# Patient Record
Sex: Female | Born: 1970 | Race: Black or African American | Hispanic: No | Smoking: Never smoker
Health system: Southern US, Community
[De-identification: ages and names within clinical notes are randomized; demographics above are authoritative.]

## PROBLEM LIST (undated history)

## (undated) DIAGNOSIS — I10 Essential (primary) hypertension: Secondary | ICD-10-CM

## (undated) DIAGNOSIS — Z8719 Personal history of other diseases of the digestive system: Secondary | ICD-10-CM

## (undated) DIAGNOSIS — R51 Headache: Secondary | ICD-10-CM

## (undated) DIAGNOSIS — G4730 Sleep apnea, unspecified: Secondary | ICD-10-CM

## (undated) DIAGNOSIS — M109 Gout, unspecified: Secondary | ICD-10-CM

## (undated) DIAGNOSIS — E785 Hyperlipidemia, unspecified: Secondary | ICD-10-CM

## (undated) DIAGNOSIS — R519 Headache, unspecified: Secondary | ICD-10-CM

## (undated) HISTORY — PX: ABDOMINAL HYSTERECTOMY: SHX81

## (undated) HISTORY — DX: Sleep apnea, unspecified: G47.30

## (undated) HISTORY — DX: Hyperlipidemia, unspecified: E78.5

## (undated) HISTORY — PX: TUBAL LIGATION: SHX77

## (undated) HISTORY — PX: OTHER SURGICAL HISTORY: SHX169

## (undated) HISTORY — DX: Essential (primary) hypertension: I10

---

## 1998-09-21 ENCOUNTER — Other Ambulatory Visit: Admission: RE | Admit: 1998-09-21 | Discharge: 1998-09-21 | Payer: Self-pay | Admitting: Obstetrics and Gynecology

## 1999-03-16 ENCOUNTER — Ambulatory Visit (HOSPITAL_COMMUNITY): Admission: RE | Admit: 1999-03-16 | Discharge: 1999-03-16 | Payer: Self-pay | Admitting: Family Medicine

## 1999-03-16 ENCOUNTER — Encounter: Payer: Self-pay | Admitting: Family Medicine

## 1999-07-26 ENCOUNTER — Other Ambulatory Visit: Admission: RE | Admit: 1999-07-26 | Discharge: 1999-07-26 | Payer: Self-pay | Admitting: Obstetrics and Gynecology

## 2000-01-06 ENCOUNTER — Emergency Department (HOSPITAL_COMMUNITY): Admission: EM | Admit: 2000-01-06 | Discharge: 2000-01-07 | Payer: Self-pay | Admitting: Emergency Medicine

## 2000-01-07 ENCOUNTER — Encounter: Payer: Self-pay | Admitting: Emergency Medicine

## 2000-08-08 ENCOUNTER — Other Ambulatory Visit: Admission: RE | Admit: 2000-08-08 | Discharge: 2000-08-08 | Payer: Self-pay | Admitting: Obstetrics and Gynecology

## 2001-07-03 ENCOUNTER — Encounter: Payer: Self-pay | Admitting: Emergency Medicine

## 2001-07-03 ENCOUNTER — Emergency Department (HOSPITAL_COMMUNITY): Admission: EM | Admit: 2001-07-03 | Discharge: 2001-07-03 | Payer: Self-pay | Admitting: Emergency Medicine

## 2001-08-12 ENCOUNTER — Other Ambulatory Visit: Admission: RE | Admit: 2001-08-12 | Discharge: 2001-08-12 | Payer: Self-pay | Admitting: Obstetrics and Gynecology

## 2001-08-26 ENCOUNTER — Ambulatory Visit (HOSPITAL_COMMUNITY): Admission: RE | Admit: 2001-08-26 | Discharge: 2001-08-26 | Payer: Self-pay | Admitting: Obstetrics and Gynecology

## 2001-08-26 ENCOUNTER — Encounter: Payer: Self-pay | Admitting: Obstetrics and Gynecology

## 2002-08-15 ENCOUNTER — Other Ambulatory Visit: Admission: RE | Admit: 2002-08-15 | Discharge: 2002-08-15 | Payer: Self-pay | Admitting: Obstetrics and Gynecology

## 2003-02-16 ENCOUNTER — Emergency Department (HOSPITAL_COMMUNITY): Admission: EM | Admit: 2003-02-16 | Discharge: 2003-02-16 | Payer: Self-pay | Admitting: Emergency Medicine

## 2003-02-16 ENCOUNTER — Encounter: Payer: Self-pay | Admitting: Emergency Medicine

## 2003-03-23 ENCOUNTER — Inpatient Hospital Stay (HOSPITAL_COMMUNITY): Admission: AD | Admit: 2003-03-23 | Discharge: 2003-03-23 | Payer: Self-pay | Admitting: Obstetrics and Gynecology

## 2003-04-20 ENCOUNTER — Inpatient Hospital Stay (HOSPITAL_COMMUNITY): Admission: AD | Admit: 2003-04-20 | Discharge: 2003-04-20 | Payer: Self-pay | Admitting: Obstetrics and Gynecology

## 2003-06-01 ENCOUNTER — Ambulatory Visit (HOSPITAL_COMMUNITY): Admission: RE | Admit: 2003-06-01 | Discharge: 2003-06-01 | Payer: Self-pay | Admitting: Obstetrics and Gynecology

## 2003-06-15 ENCOUNTER — Ambulatory Visit (HOSPITAL_COMMUNITY): Admission: RE | Admit: 2003-06-15 | Discharge: 2003-06-15 | Payer: Self-pay | Admitting: Obstetrics and Gynecology

## 2003-07-07 ENCOUNTER — Encounter: Admission: RE | Admit: 2003-07-07 | Discharge: 2003-10-05 | Payer: Self-pay | Admitting: Obstetrics and Gynecology

## 2003-08-09 ENCOUNTER — Inpatient Hospital Stay (HOSPITAL_COMMUNITY): Admission: AD | Admit: 2003-08-09 | Discharge: 2003-08-14 | Payer: Self-pay | Admitting: Obstetrics and Gynecology

## 2003-08-17 ENCOUNTER — Inpatient Hospital Stay (HOSPITAL_COMMUNITY): Admission: AD | Admit: 2003-08-17 | Discharge: 2003-08-17 | Payer: Self-pay | Admitting: Obstetrics and Gynecology

## 2003-08-21 ENCOUNTER — Ambulatory Visit (HOSPITAL_COMMUNITY): Admission: RE | Admit: 2003-08-21 | Discharge: 2003-08-21 | Payer: Self-pay | Admitting: Obstetrics and Gynecology

## 2003-08-21 ENCOUNTER — Encounter: Admission: RE | Admit: 2003-08-21 | Discharge: 2003-08-21 | Payer: Self-pay | Admitting: Obstetrics and Gynecology

## 2003-08-26 ENCOUNTER — Inpatient Hospital Stay (HOSPITAL_COMMUNITY): Admission: AD | Admit: 2003-08-26 | Discharge: 2003-08-26 | Payer: Self-pay | Admitting: Obstetrics and Gynecology

## 2003-08-28 ENCOUNTER — Ambulatory Visit (HOSPITAL_COMMUNITY): Admission: RE | Admit: 2003-08-28 | Discharge: 2003-08-28 | Payer: Self-pay | Admitting: Obstetrics and Gynecology

## 2003-08-28 ENCOUNTER — Encounter: Admission: RE | Admit: 2003-08-28 | Discharge: 2003-08-28 | Payer: Self-pay | Admitting: Obstetrics and Gynecology

## 2003-08-30 ENCOUNTER — Encounter (INDEPENDENT_AMBULATORY_CARE_PROVIDER_SITE_OTHER): Payer: Self-pay | Admitting: Specialist

## 2003-08-30 ENCOUNTER — Inpatient Hospital Stay (HOSPITAL_COMMUNITY): Admission: AD | Admit: 2003-08-30 | Discharge: 2003-09-02 | Payer: Self-pay | Admitting: Obstetrics and Gynecology

## 2003-09-02 ENCOUNTER — Inpatient Hospital Stay (HOSPITAL_COMMUNITY): Admission: AD | Admit: 2003-09-02 | Discharge: 2003-09-03 | Payer: Self-pay | Admitting: Obstetrics and Gynecology

## 2003-09-03 ENCOUNTER — Inpatient Hospital Stay (HOSPITAL_COMMUNITY): Admission: AD | Admit: 2003-09-03 | Discharge: 2003-09-03 | Payer: Self-pay | Admitting: Obstetrics and Gynecology

## 2003-09-29 ENCOUNTER — Inpatient Hospital Stay (HOSPITAL_COMMUNITY): Admission: AD | Admit: 2003-09-29 | Discharge: 2003-09-29 | Payer: Self-pay | Admitting: Obstetrics and Gynecology

## 2003-11-12 ENCOUNTER — Emergency Department (HOSPITAL_COMMUNITY): Admission: EM | Admit: 2003-11-12 | Discharge: 2003-11-12 | Payer: Self-pay | Admitting: Emergency Medicine

## 2003-11-18 ENCOUNTER — Encounter (INDEPENDENT_AMBULATORY_CARE_PROVIDER_SITE_OTHER): Payer: Self-pay | Admitting: Specialist

## 2003-11-18 ENCOUNTER — Ambulatory Visit (HOSPITAL_COMMUNITY): Admission: RE | Admit: 2003-11-18 | Discharge: 2003-11-18 | Payer: Self-pay | Admitting: Obstetrics and Gynecology

## 2003-12-21 ENCOUNTER — Other Ambulatory Visit: Admission: RE | Admit: 2003-12-21 | Discharge: 2003-12-21 | Payer: Self-pay | Admitting: Obstetrics and Gynecology

## 2004-08-22 ENCOUNTER — Emergency Department (HOSPITAL_COMMUNITY): Admission: EM | Admit: 2004-08-22 | Discharge: 2004-08-22 | Payer: Self-pay | Admitting: Family Medicine

## 2004-09-21 IMAGING — CT CT ABDOMEN W/ CM
1 of 2 series · 15 of 32 positions shown, 19 images · IV contrast (omnipaque)
Comparison: none

CLINICAL DATA: Persistent abdominal pain, post-partum. 
 CT OF THE ABDOMEN WITH CONTRAST 
 No prior studies.
TECHNIQUE: Contiguous axial CT images were obtained from the lung bases through the iliac crests following oral contrast and intravenous administration of 150 cc of Omnipaque 300 IV contrast.

[Series 2: — · axial · 0.73mm/px · z∈[-481,-76]mm · 15 of 93 slices shown, 19 images]
[im 4/93  soft-tissue]
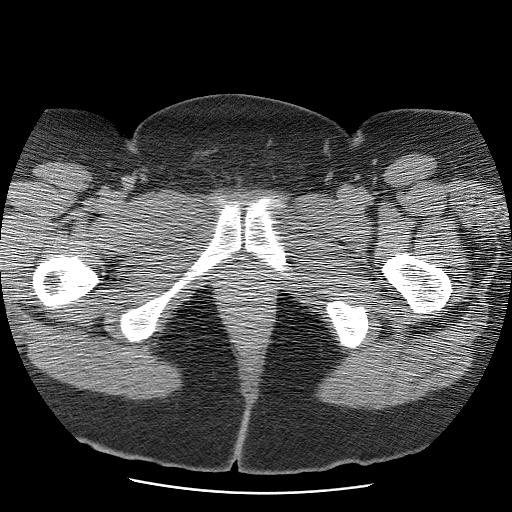
[im 4/93  bone]
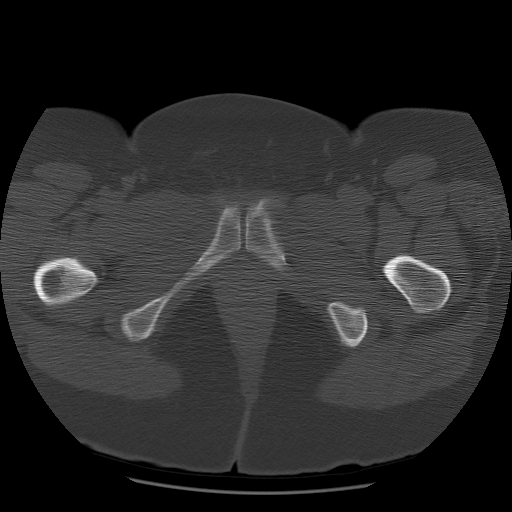
[im 12/93  soft-tissue]
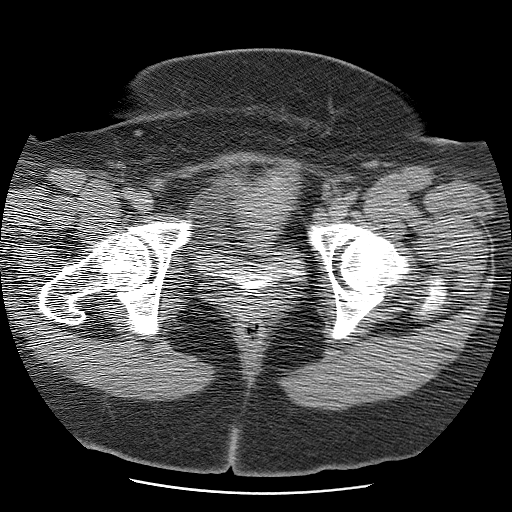
[im 19/93  soft-tissue]
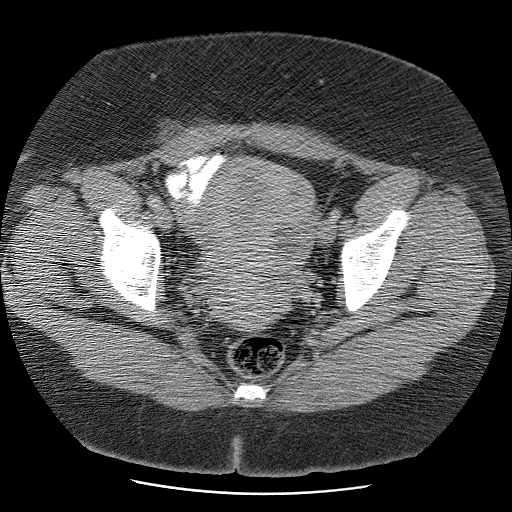
[im 26/93  soft-tissue]
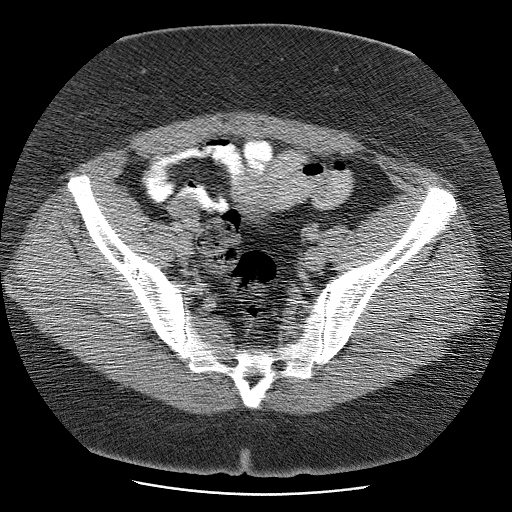
[im 34/93  soft-tissue]
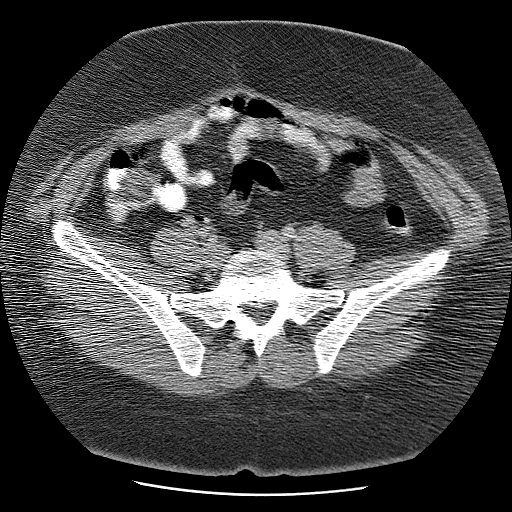
[im 41/93  soft-tissue]
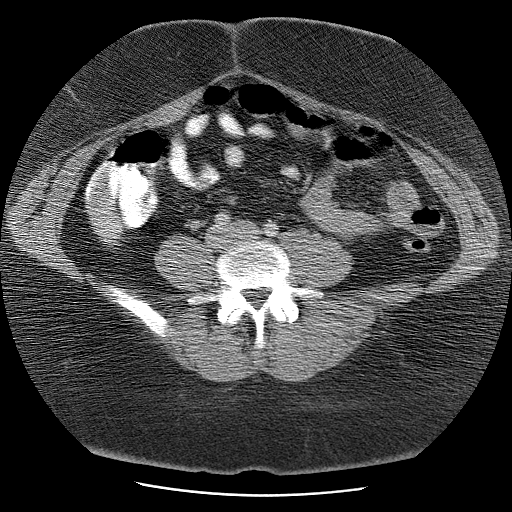
[im 48/93  soft-tissue]
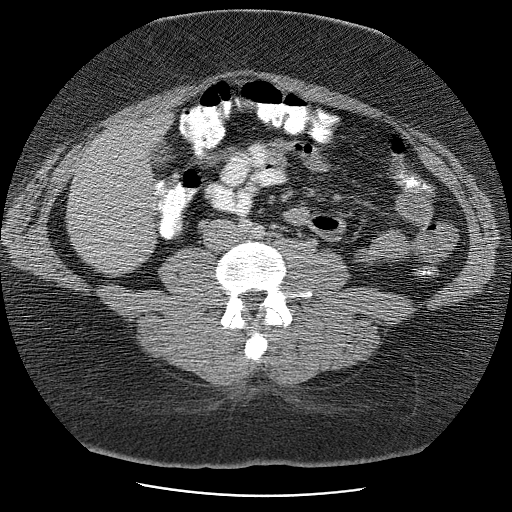
[im 52/93  soft-tissue]
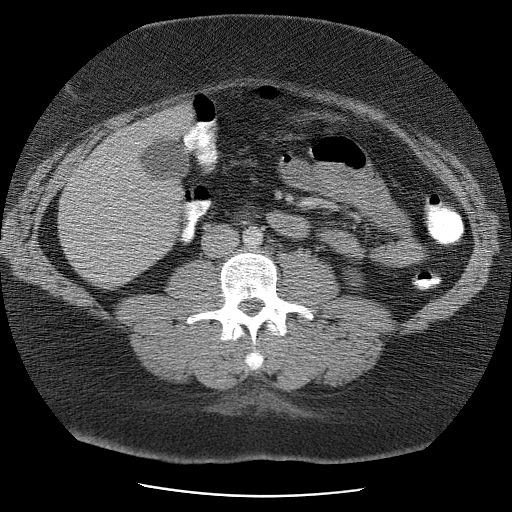
[im 59/93  soft-tissue]
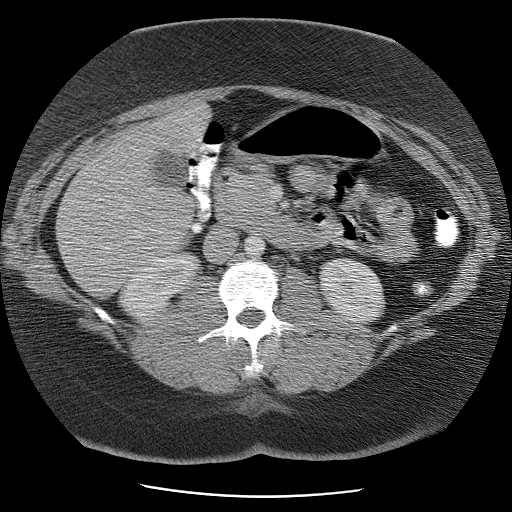
[im 59/93  bone]
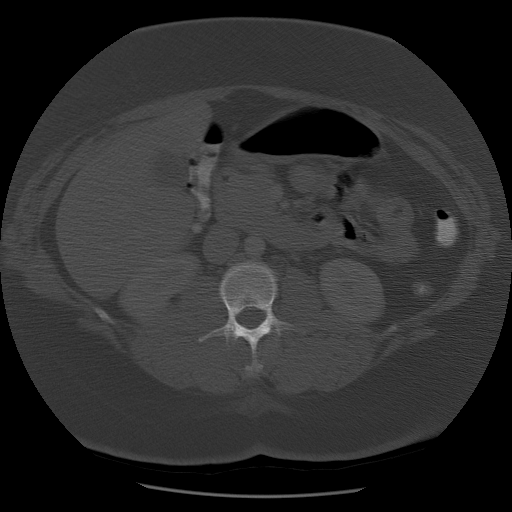
[im 67/93  soft-tissue]
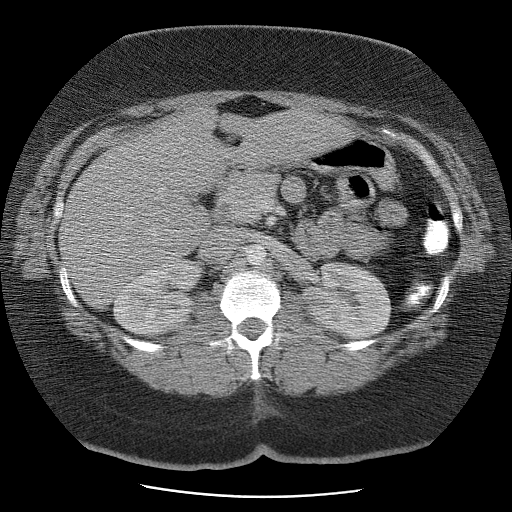
[im 74/93  soft-tissue]
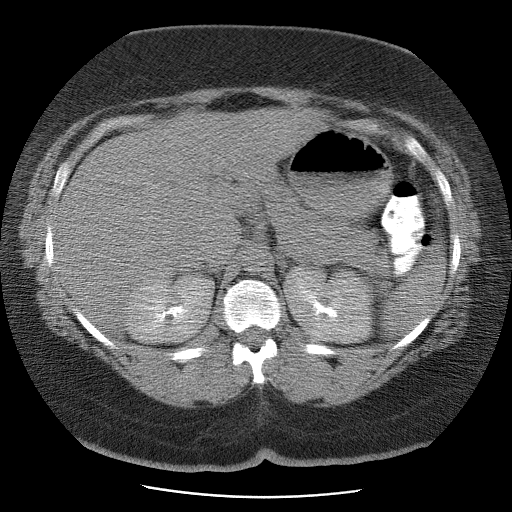
[im 78/93  lung]
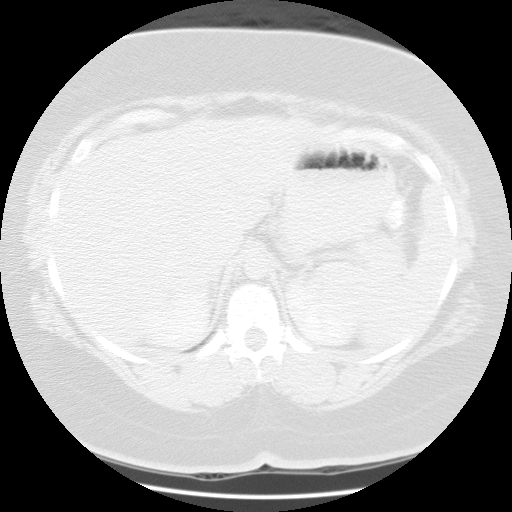
[im 81/93  soft-tissue]
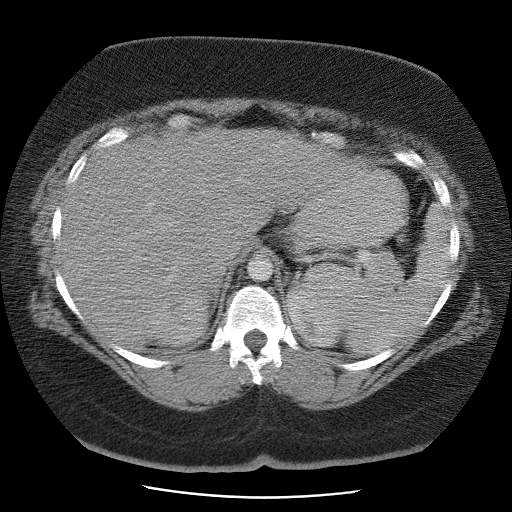
[im 81/93  lung]
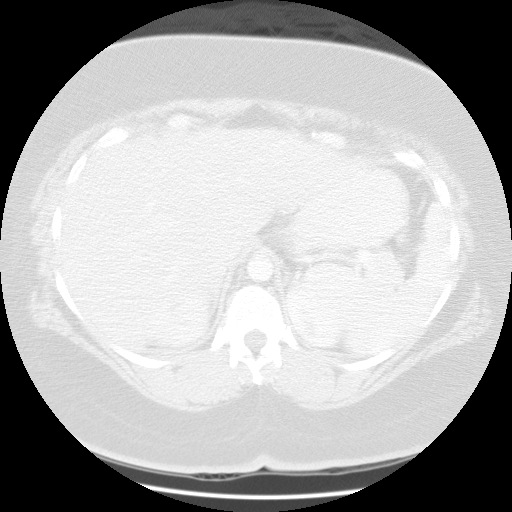
[im 85/93  lung]
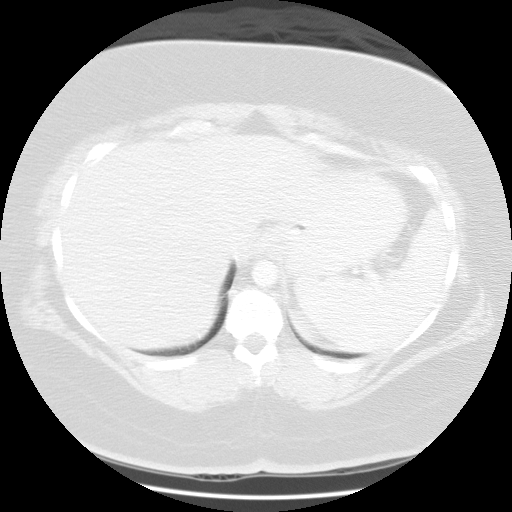
[im 89/93  soft-tissue]
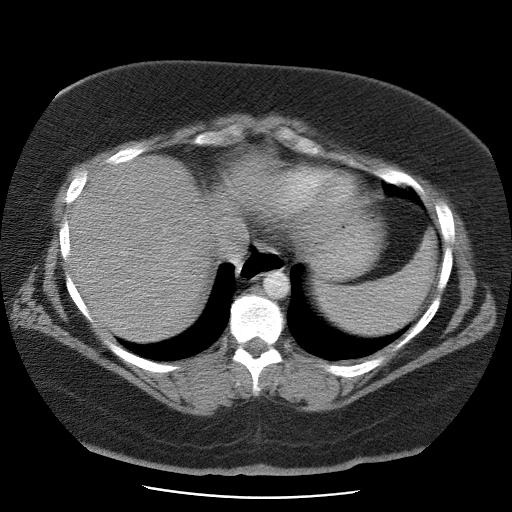
[im 89/93  lung]
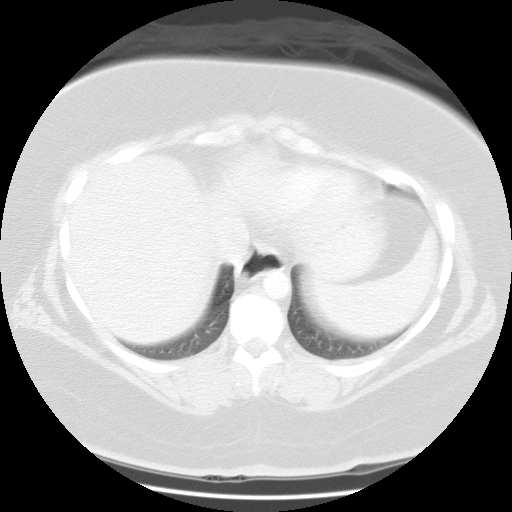

[15 of 32 positions shown; findings below may reference images not displayed]

FINDINGS: The patient?s body habitus reduces diagnostic sensitivity and specificity.  No discrete abnormality of the liver, gallbladder, spleen, pancreas, kidneys or adrenal glands is evident.  The abdominal aorta appears unremarkable.  Ascending colon appears normal.  Proximal jejunal loops do not have oral contrast within them and thus are relatively nonspecific.  There is contrast in the colon and distal small bowel which appears normal.  No dilated bowel to suggest obstruction.  No adenopathy.  
 IMPRESSION
 1.  No cause for the patient?s right-sided abdominal pain is apparent on today?s CT scan of the abdomen. 
 CT OF THE PELVIS WITH CONTRAST 
 Contiguous axial CT images were obtained from the iliac crests to the proximal femora following oral and IV contrast.
FINDINGS: There are multiple uterine fibroids.  The largest of these is in the fundus and measures approximately 4 cm in diameter.  The ovaries appear unremarkable.  The appendix likewise appears unremarkable.  No significant free pelvic fluid. 
 No pelvic abscess is identified. 
 IMPRESSION
 1.  Uterine fibroids. 
 2.  No evidence of pelvic abscess or cause for the patient?s right-sided abdominal pain.

## 2005-03-20 ENCOUNTER — Other Ambulatory Visit: Admission: RE | Admit: 2005-03-20 | Discharge: 2005-03-20 | Payer: Self-pay | Admitting: Obstetrics and Gynecology

## 2005-07-28 ENCOUNTER — Emergency Department (HOSPITAL_COMMUNITY): Admission: EM | Admit: 2005-07-28 | Discharge: 2005-07-28 | Payer: Self-pay | Admitting: Family Medicine

## 2006-03-16 ENCOUNTER — Emergency Department (HOSPITAL_COMMUNITY): Admission: EM | Admit: 2006-03-16 | Discharge: 2006-03-16 | Payer: Self-pay | Admitting: Family Medicine

## 2006-08-09 ENCOUNTER — Other Ambulatory Visit: Admission: RE | Admit: 2006-08-09 | Discharge: 2006-08-09 | Payer: Self-pay | Admitting: Family Medicine

## 2007-04-26 ENCOUNTER — Emergency Department (HOSPITAL_COMMUNITY): Admission: EM | Admit: 2007-04-26 | Discharge: 2007-04-26 | Payer: Self-pay | Admitting: Emergency Medicine

## 2007-07-29 ENCOUNTER — Emergency Department (HOSPITAL_COMMUNITY): Admission: EM | Admit: 2007-07-29 | Discharge: 2007-07-29 | Payer: Self-pay | Admitting: Emergency Medicine

## 2007-08-19 ENCOUNTER — Other Ambulatory Visit: Admission: RE | Admit: 2007-08-19 | Discharge: 2007-08-19 | Payer: Self-pay | Admitting: Family Medicine

## 2008-08-13 ENCOUNTER — Encounter (INDEPENDENT_AMBULATORY_CARE_PROVIDER_SITE_OTHER): Payer: Self-pay | Admitting: Obstetrics and Gynecology

## 2008-08-13 ENCOUNTER — Inpatient Hospital Stay (HOSPITAL_COMMUNITY): Admission: RE | Admit: 2008-08-13 | Discharge: 2008-08-15 | Payer: Self-pay | Admitting: Obstetrics and Gynecology

## 2008-12-01 ENCOUNTER — Emergency Department (HOSPITAL_COMMUNITY): Admission: EM | Admit: 2008-12-01 | Discharge: 2008-12-01 | Payer: Self-pay | Admitting: Emergency Medicine

## 2009-05-19 ENCOUNTER — Emergency Department (HOSPITAL_COMMUNITY): Admission: EM | Admit: 2009-05-19 | Discharge: 2009-05-19 | Payer: Self-pay | Admitting: Family Medicine

## 2009-08-24 ENCOUNTER — Emergency Department (HOSPITAL_COMMUNITY): Admission: EM | Admit: 2009-08-24 | Discharge: 2009-08-24 | Payer: Self-pay | Admitting: Family Medicine

## 2009-11-16 ENCOUNTER — Emergency Department (HOSPITAL_COMMUNITY): Admission: EM | Admit: 2009-11-16 | Discharge: 2009-11-16 | Payer: Self-pay | Admitting: Family Medicine

## 2009-12-16 ENCOUNTER — Emergency Department (HOSPITAL_COMMUNITY): Admission: EM | Admit: 2009-12-16 | Discharge: 2009-12-16 | Payer: Self-pay | Admitting: Family Medicine

## 2010-04-05 LAB — HM PAP SMEAR: HM Pap smear: NORMAL

## 2010-04-16 ENCOUNTER — Ambulatory Visit: Payer: Self-pay | Admitting: Family Medicine

## 2010-04-16 ENCOUNTER — Other Ambulatory Visit
Admission: RE | Admit: 2010-04-16 | Discharge: 2010-04-16 | Payer: Self-pay | Source: Home / Self Care | Admitting: Family Medicine

## 2010-04-16 DIAGNOSIS — J309 Allergic rhinitis, unspecified: Secondary | ICD-10-CM

## 2010-04-16 DIAGNOSIS — E782 Mixed hyperlipidemia: Secondary | ICD-10-CM

## 2010-04-16 DIAGNOSIS — I10 Essential (primary) hypertension: Secondary | ICD-10-CM | POA: Insufficient documentation

## 2010-04-17 ENCOUNTER — Encounter (INDEPENDENT_AMBULATORY_CARE_PROVIDER_SITE_OTHER): Payer: Self-pay | Admitting: *Deleted

## 2010-04-28 ENCOUNTER — Encounter: Payer: Self-pay | Admitting: Family Medicine

## 2010-06-11 ENCOUNTER — Encounter: Payer: Self-pay | Admitting: Obstetrics and Gynecology

## 2010-06-19 LAB — CONVERTED CEMR LAB
ALT: 15 units/L (ref 0–35)
AST: 19 units/L (ref 0–37)
Albumin: 4 g/dL (ref 3.5–5.2)
Alkaline Phosphatase: 54 units/L (ref 39–117)
BUN: 11 mg/dL (ref 6–23)
Basophils Absolute: 0 10*3/uL (ref 0.0–0.1)
Basophils Relative: 0 % (ref 0–1)
Bilirubin Urine: NEGATIVE
CO2: 26 meq/L (ref 19–32)
Calcium: 9.1 mg/dL (ref 8.4–10.5)
Chloride: 100 meq/L (ref 96–112)
Creatinine, Ser: 0.72 mg/dL (ref 0.40–1.20)
Eosinophils Absolute: 0.2 10*3/uL (ref 0.0–0.7)
Eosinophils Relative: 3 % (ref 0–5)
Glucose, Bld: 86 mg/dL (ref 70–99)
Glucose, Urine, Semiquant: NEGATIVE
HCT: 37.9 % (ref 36.0–46.0)
Hemoglobin: 12.5 g/dL (ref 12.0–15.0)
Ketones, urine, test strip: NEGATIVE
Lymphocytes Relative: 38 % (ref 12–46)
Lymphs Abs: 2.5 10*3/uL (ref 0.7–4.0)
MCHC: 33 g/dL (ref 30.0–36.0)
MCV: 83.7 fL (ref 78.0–100.0)
Monocytes Absolute: 0.6 10*3/uL (ref 0.1–1.0)
Monocytes Relative: 9 % (ref 3–12)
Neutro Abs: 3.3 10*3/uL (ref 1.7–7.7)
Neutrophils Relative %: 50 % (ref 43–77)
Nitrite: NEGATIVE
Platelets: 275 10*3/uL (ref 150–400)
Potassium: 4 meq/L (ref 3.5–5.3)
Protein, U semiquant: NEGATIVE
RBC: 4.53 M/uL (ref 3.87–5.11)
RDW: 13.5 % (ref 11.5–15.5)
Sodium: 137 meq/L (ref 135–145)
Specific Gravity, Urine: 1.02
Total Bilirubin: 0.5 mg/dL (ref 0.3–1.2)
Total Protein: 7.3 g/dL (ref 6.0–8.3)
Urobilinogen, UA: 0.2
WBC Urine, dipstick: NEGATIVE
WBC: 6.6 10*3/uL (ref 4.0–10.5)
pH: 6.5

## 2010-06-21 NOTE — Letter (Signed)
Summary: Gynecologic Cytology report  Gynecologic Cytology report   Imported By: Dorna Leitz 04/28/2010 14:23:28  _____________________________________________________________________  External Attachment:    Type:   Image     Comment:   External Document

## 2010-06-21 NOTE — Assessment & Plan Note (Signed)
Summary: PHYSICAL CHECK/EVMPHYSICAL CHECK-UP/EVM   Vital Signs:  Patient Profile:   40 Years Old Female CC:      cpx with pap// also complains of cough dry x 2 weeks Height:     68 inches Weight:      332 pounds BMI:     50.66 O2 Sat:      98 % O2 treatment:    Room Air Temp:     99.1 degrees F oral Pulse rate:   80 / minute Pulse rhythm:   regular Resp:     20 per minute BP sitting:   140 / 80  (left arm) Cuff size:   large  Pt. in pain?   no  Vitals Entered By: Providence Crosby LPN (April 16, 2010 11:41 AM)                   Current Allergies: ! * CODEINEHistory of Present Illness History from: patient Chief Complaint: cpx with pap// also complains of cough dry x 2 weeks History of Present Illness: cpx only complains of dry hacking cough x 2 weeks. Had partial hysterectomy March of 2010. Pt presenting today for a CPE.  Pt reports that she had a CPE 1 year ago with EKG in 07/2008 that was WNL.  Pt reports a persistent cough for 2 weeks with small production of mucus, some fever, no chills, no SOB, no wheezing but worsening nighttime cough reported, some sneezing and clear runny nose, taking Zyrtec D and Mucinex D with no improvement.  Pt would like to have flu vaccine this  year and usually gets it every year.  Pt reports no history of tobacco use.  Pt is taking lisinopril but has been taking it for the last 7 years with no problems.  Pt reports that she did not take it today.  BP is mildly elevated.  No CP.  No diarrhea, nausea or vomiting.  Pt reports that she is allergic to Codeine.  Pt reports that she is wanting to have a Pap done today.  Pt says Pap last years was WNL. No h/o abnormal PAP tests reported by pt.  Also, pt says she has had a hysterectomy and 1 ovary was removed. (Pt not sure if left or right ovary was removed).   REVIEW OF SYSTEMS Constitutional Symptoms      Denies fever, chills, night sweats, weight loss, weight gain, and fatigue.  Eyes       Complains of  glasses.      Denies change in vision, eye pain, eye discharge, contact lenses, and eye surgery.      Comments: FOR READING Ear/Nose/Throat/Mouth       Denies hearing loss/aids, change in hearing, ear pain, ear discharge, dizziness, frequent runny nose, frequent nose bleeds, sinus problems, sore throat, hoarseness, and tooth pain or bleeding.  Respiratory       Complains of dry cough.      Denies productive cough, wheezing, shortness of breath, asthma, bronchitis, and emphysema/COPD.  Cardiovascular       Denies murmurs, chest pain, and tires easily with exhertion.    Gastrointestinal       Denies stomach pain, nausea/vomiting, diarrhea, constipation, blood in bowel movements, and indigestion. Genitourniary       Denies painful urination, kidney stones, and loss of urinary control. Neurological       Denies paralysis, seizures, and fainting/blackouts. Musculoskeletal       Denies muscle pain, joint pain, joint stiffness, decreased range of motion,  redness, swelling, muscle weakness, and gout.  Skin       Denies bruising, unusual mles/lumps or sores, and hair/skin or nail changes.  Psych       Denies mood changes, temper/anger issues, anxiety/stress, speech problems, depression, and sleep problems.  Past History:  Past Medical History: Hypertension  Past Surgical History: Hysterectomy 1 ovary was removed   Family History: Father: ALIVE AND WELL= 62YOA Mother: ALIVE AND WELL =59YOA Siblings: 1 BROTHER MURDERED AT AGE 20 Hypertension and Diabetes Mellitus (type 2)  Social History: Marital Status:SEPERATED CHILDREN : 3 2 BOYS AND A GIRL Occupation: LABWORK Never Smoked Alcohol use-no Drug use-no Regular exercise-no Smoking Status:  never Drug Use:  no Does Patient Exercise:  no Physical Exam General appearance: well developed, well nourished, no acute distress Head: normocephalic, atraumatic Eyes: conjunctivae and lids normal Pupils: equal, round, reactive to  light Ears: normal, no lesions or deformities Nasal: marked sinus and nasal congestion Oral/Pharynx: tongue normal, posterior pharynx without erythema or exudate Neck: neck supple,  trachea midline, no masses Chest/Lungs: no rales, wheezes, or rhonchi bilateral, breath sounds equal without effort, rare post crackles at bilat bases Heart: regular rate and  rhythm, no murmur Abdomen: soft, non-tender without obvious organomegaly GU: normal external genitalia, no lesions seen, normal vaginal mucosa, no cervix seen Extremities: normal extremities Neurological: grossly intact and non-focal Skin: no obvious rashes or lesions MSE: oriented to time, place, and person Assessment Problems:   WALKING PNEUMONIA (ICD-483.0) COUGH (ICD-786.2) MIXED HYPERLIPIDEMIA (ICD-272.2) UNSPECIFIED ESSENTIAL HYPERTENSION (ICD-401.9) ROUTINE GENERAL MEDICAL EXAM@HEALTH  CARE FACL (ICD-V70.0) HEMATURIA UNSPECIFIED (ICD-599.70) FOLLOWING SURGERY FOLLOW-UP VAGINAL PAP SMEAR (ICD-V67.01) ALLERGIC RHINITIS CAUSE UNSPECIFIED (ICD-477.9) New Problems: WALKING PNEUMONIA (ICD-483.0) COUGH (ICD-786.2) MIXED HYPERLIPIDEMIA (ICD-272.2) UNSPECIFIED ESSENTIAL HYPERTENSION (ICD-401.9) ROUTINE GENERAL MEDICAL EXAM@HEALTH  CARE FACL (ICD-V70.0) HEMATURIA UNSPECIFIED (ICD-599.70) FOLLOWING SURGERY FOLLOW-UP VAGINAL PAP SMEAR (ICD-V67.01) ALLERGIC RHINITIS CAUSE UNSPECIFIED (ICD-477.9)   Patient Education: Patient and/or caregiver instructed in the following: rest, fluids, exercise, weight loss. The risks, benefits and possible side effects were clearly explained and discussed with the patient.  The patient verbalized clear understanding.  The patient was given instructions to return if symptoms don't improve, worsen or new changes develop.  If it is not during clinic hours and the patient cannot get back to this clinic then the patient was told to seek medical care at an available urgent care or emergency department.  The  patient verbalized understanding.   Demonstrates willingness to comply.  Plan New Medications/Changes: LISINOPRIL-HYDROCHLOROTHIAZIDE 20-25 MG TABS (LISINOPRIL-HYDROCHLOROTHIAZIDE) take 1 by mouth daily for blood pressure  #90 x 1, 04/16/2010, Clanford Johnson MD ALLEGRA ALLERGY 180 MG TABS (FEXOFENADINE HCL) take 1 tab by mouth daily as needed for allergies  #15 x 1, 04/16/2010, Clanford Johnson MD FLUTICASONE PROPIONATE 50 MCG/ACT SUSP (FLUTICASONE PROPIONATE) 2 sprays per nostril once daily  #1 x 0, 04/16/2010, Clanford Johnson MD DELSYM 30 MG/5ML LQCR (DEXTROMETHORPHAN POLISTIREX) take 1 teaspoon by mouth every 12 hours as needed for coughing  #100 mL x 0, 04/16/2010, Clanford Johnson MD DOXYCYCLINE HYCLATE 100 MG TABS (DOXYCYCLINE HYCLATE) take 1 by mouth two times a day with food until completed  #14 x 0, 04/16/2010, Clanford Johnson MD  New Orders: Nebulizer Tx [phone] Flu Vaccine & Administration [phone] Obtaining Screening PAP Smear [Q0091] UA Dipstick w/o Micro (manual) [81002] Influenza Vaccine NON MCR [00028] Planning Comments:   I also explained the possibility that the patient could have cough related to the lisinopril medication that she is taking and that if her  cough didn't improve her medication may need to be changed. The patient verbalized clear understanding.    Follow Up: Follow up in 2-3 days if no improvement, Follow up on an as needed basis, Follow up with Primary Physician  The patient and/or caregiver has been counseled thoroughly with regard to medications prescribed including dosage, schedule, interactions, rationale for use, and possible side effects and they verbalize understanding.  Diagnoses and expected course of recovery discussed and will return if not improved as expected or if the condition worsens. Patient and/or caregiver verbalized understanding.  Prescriptions: LISINOPRIL-HYDROCHLOROTHIAZIDE 20-25 MG TABS (LISINOPRIL-HYDROCHLOROTHIAZIDE) take 1 by  mouth daily for blood pressure  #90 x 1   Entered and Authorized by:   Standley Dakins MD   Signed by:   Standley Dakins MD on 04/16/2010   Method used:   Electronically to        [facility] [address] (retail)       [address], [address]       [place], [place]  [address]       Ph: [phone]       Fax: [phone]   RxID:   [id number] [facility] ALLERGY 180 MG TABS (FEXOFENADINE HCL) take 1 tab by mouth daily as needed for allergies  #15 x 1   Entered and Authorized by:   Standley Dakins MD   Signed by:   Standley Dakins MD on 04/16/2010   Method used:   Electronically to        [facility] [address] (retail)       [address], [address]       [place], [place]  [address]       Ph: [phone]       Fax: [phone]   RxID:   [id number] FLUTICASONE PROPIONATE 50 MCG/ACT SUSP (FLUTICASONE PROPIONATE) 2 sprays per nostril once daily  #1 x 0   Entered and Authorized by:   Standley Dakins MD   Signed by:   Standley Dakins MD on 04/16/2010   Method used:   Electronically to        [facility] [address] (retail)       [address], [address]       [place], [place]  [address]       Ph: [phone]       Fax: [phone]   RxID:   [id number] DELSYM 30 MG/5ML LQCR (DEXTROMETHORPHAN POLISTIREX) take 1 teaspoon by mouth every 12 hours as needed for coughing  #100 mL x 0   Entered and Authorized by:   Standley Dakins MD   Signed by:   Standley Dakins MD on 04/16/2010   Method used:   Electronically to        [facility] [address] (retail)       [address], [address]       [place], [place]  [address]       Ph: [phone]       Fax: [phone]   RxID:   [id number] DOXYCYCLINE HYCLATE 100 MG TABS (DOXYCYCLINE HYCLATE) take 1 by mouth two times a day with food until completed  #14 x 0   Entered and Authorized by:   Standley Dakins MD   Signed by:   Standley Dakins MD  on 04/16/2010   Method used:   Electronically to        [facility] [address] (retail)       [address], [address]       [place], [place]  [address]       Ph: [phone]       Fax: [phone]   RxID:   [id number]   Patient Instructions: 1)  It is important that you exercise regularly at least 20 minutes 5 times a week. If you develop chest pain, have severe difficulty breathing, or feel very tired , stop exercising immediately and seek medical attention. 2)  You need to lose weight. Consider a lower calorie diet and regular exercise.  3)  Check your Blood Pressure regularly. If it is above 140/90 you should make an appointment. 4)  Oral Rehydration Solution: drink 1/2 ounce every 15 minutes. If tolerated afert 1 hour, drink 1 ounce every 15 minutes. As you can tolerate, keep adding 1/2 ounce every 15 minutes, up to a total of 2-4 ounces. Contact the office if unable to tolerate oral solution, if you keep vomiting, or you continue to have signs of dehydration. 5)  Take your antibiotic as prescribed until ALL of it is gone, but stop if you develop a rash or swelling and contact our office as soon as possible. 6)  Acute sinusitis symptoms for less than 10 days are not helped by antibiotics.Use warm moist compresses, and over the counter decongestants ( only as directed). Call if no improvement in 5-7 days, sooner if increasing pain, fever, or new symptoms. 7)  Acute bronchitis symptoms for less than 10 days are not helped by antibiotics. take over the counter cough medications. call if no improvment in  5-7 days, sooner if increasing cough, fever, or new symptoms( shortness of breath, chest pain). 8)  The patient was informed that there is no on-call provider or services available at this clinic during off-hours (when the clinic is closed).  If the patient developed a problem or concern that required immediate  attention, the patient was advised to go the the nearest available urgent care or emergency department for medical care.  The patient verbalized understanding.     Orders Added: 1)  Nebulizer Tx [94640] 2)  Flu Vaccine & Administration [phone] 3)  Obtaining Screening PAP Smear [Q0091] 4)  UA Dipstick w/o Micro (manual) [81002] 5)  Influenza Vaccine NON MCR [00028]   Immunizations Administered:  Influenza Vaccine:    Vaccine Type: Fluvax Non-MCR    Site: left deltoid    Mfr: GlaxoSmithKline    Dose: 0.5 ml    Route: IM    Given by: Providence Crosby LPN    Exp. Date: 10/2010    Lot #: [id number]    VIS given: 12/14/09 version given April 16, 2010.   Immunizations Administered:  Influenza Vaccine:    Vaccine Type: Fluvax Non-MCR    Site: left deltoid    Mfr: GlaxoSmithKline    Dose: 0.5 ml    Route: IM    Given by: Providence Crosby LPN    Exp. Date: 10/2010    Lot #: [id number]    VIS given: 12/14/09 version given April 16, 2010.  Flu Vaccine Consent Questions:    Do you have a history of severe allergic reactions to this vaccine? no    Any prior history of allergic reactions to egg and/or gelatin? no    Do  you have a sensitivity to the preservative Thimersol? no    Do you have a past history of Guillan-Barre Syndrome? no    Do you currently have an acute febrile illness? no    Have you ever had a severe reaction to latex? no    Vaccine information given and explained to patient? yes    Are you currently pregnant? no   Flu Vaccine Consent Questions    Do you have a history of severe allergic reactions to this vaccine? no    Any prior history of allergic reactions to egg and/or gelatin? no    Do you have a sensitivity to the preservative Thimersol? no    Do you have a past history of Guillan-Barre Syndrome? no    Do you currently have an acute febrile illness? no    Have you ever had a severe reaction to latex? no    Vaccine information given and explained to patient?  yes    Are you currently pregnant? no    Laboratory Results   Urine Tests  Date/Time Recieved: April 16, 2010 1:28 PM  Date/Time Reported: April 16, 2010 1:28 PM   Routine Urinalysis   Color: yellow Appearance: Clear Glucose: negative   (Normal Range: Negative) Bilirubin: negative   (Normal Range: Negative) Ketone: negative   (Normal Range: Negative) Spec. Gravity: 1.020   (Normal Range: 1.003-1.035) Blood: trace-intact   (Normal Range: Negative) pH: 6.5   (Normal Range: 5.0-8.0) Protein: negative   (Normal Range: Negative) Urobilinogen: 0.2   (Normal Range: 0-1) Nitrite: negative   (Normal Range: Negative) Leukocyte Esterace: negative   (Normal Range: Negative)    Comments: urine for culture sent as ordered

## 2010-06-21 NOTE — Letter (Signed)
Summary: Results Follow-up Letter  [facility]  [address]   [place], [place] [address]   Phone: [phone]  Fax: [phone]    04/17/2010  [address] [place], [place]  [address]  Dear Ms. Bleau,   The following are the results of your recent test(s):  Test     Result     Pap Smear    Normal_______  Not Normal_____       Comments: _________________________________________________________ Cholesterol LDL(Bad cholesterol):          Your goal is less than:         HDL (Good cholesterol):        Your goal is more than: _________________________________________________________ Other Tests:Enclosed is a copy of your lab work and instructions  about low cholesterol diet. Please remember to get your cholesterol checked again in 3 months. Try to exercise 30 minutes a day for 5 day a week as instructed per Dr. Laural Benes.   _________________________________________________________  Please call for an appointment Or _________________________________________________________ _________________________________________________________ _________________________________________________________  Sincerely,  Providence Crosby LPN [facility]

## 2010-06-21 NOTE — Miscellaneous (Signed)
Summary: Lab Results  Clinical Lists Changes  Pt. was notified by message system of current Cytology results. Pt ws advised that if she had any questions or concerns to cantact our office. / rwt

## 2010-08-07 LAB — CBC
HCT: 38.8 % (ref 36.0–46.0)
Hemoglobin: 13 g/dL (ref 12.0–15.0)
MCH: 29.3 pg (ref 26.0–34.0)
MCHC: 33.5 g/dL (ref 30.0–36.0)
MCV: 87.3 fL (ref 78.0–100.0)
Platelets: 228 10*3/uL (ref 150–400)
RBC: 4.45 MIL/uL (ref 3.87–5.11)
RDW: 14.5 % (ref 11.5–15.5)
WBC: 8.6 10*3/uL (ref 4.0–10.5)

## 2010-08-07 LAB — POCT I-STAT, CHEM 8
BUN: 11 mg/dL (ref 6–23)
Calcium, Ion: 1.1 mmol/L — ABNORMAL LOW (ref 1.12–1.32)
Chloride: 101 mEq/L (ref 96–112)
Creatinine, Ser: 0.9 mg/dL (ref 0.4–1.2)
Glucose, Bld: 93 mg/dL (ref 70–99)
HCT: 41 % (ref 36.0–46.0)
Hemoglobin: 13.9 g/dL (ref 12.0–15.0)
Potassium: 3.4 mEq/L — ABNORMAL LOW (ref 3.5–5.1)
Sodium: 138 mEq/L (ref 135–145)
TCO2: 28 mmol/L (ref 0–100)

## 2010-08-07 LAB — DIFFERENTIAL
Basophils Absolute: 0 10*3/uL (ref 0.0–0.1)
Basophils Relative: 0 % (ref 0–1)
Eosinophils Absolute: 0.1 10*3/uL (ref 0.0–0.7)
Eosinophils Relative: 2 % (ref 0–5)
Lymphocytes Relative: 43 % (ref 12–46)
Lymphs Abs: 3.7 10*3/uL (ref 0.7–4.0)
Monocytes Absolute: 0.7 10*3/uL (ref 0.1–1.0)
Monocytes Relative: 8 % (ref 3–12)
Neutro Abs: 4.1 10*3/uL (ref 1.7–7.7)
Neutrophils Relative %: 48 % (ref 43–77)

## 2010-08-07 LAB — URIC ACID: Uric Acid, Serum: 9.3 mg/dL — ABNORMAL HIGH (ref 2.4–7.0)

## 2010-08-22 LAB — GC/CHLAMYDIA PROBE AMP, GENITAL
Chlamydia, DNA Probe: NEGATIVE
GC Probe Amp, Genital: NEGATIVE

## 2010-08-22 LAB — POCT URINALYSIS DIP (DEVICE)
Glucose, UA: NEGATIVE mg/dL
Hgb urine dipstick: NEGATIVE
Ketones, ur: 15 mg/dL — AB
Nitrite: NEGATIVE
Protein, ur: NEGATIVE mg/dL
Specific Gravity, Urine: 1.025 (ref 1.005–1.030)
Urobilinogen, UA: 1 mg/dL (ref 0.0–1.0)
pH: 5.5 (ref 5.0–8.0)

## 2010-08-22 LAB — WET PREP, GENITAL
Trich, Wet Prep: NONE SEEN
WBC, Wet Prep HPF POC: NONE SEEN
Yeast Wet Prep HPF POC: NONE SEEN

## 2010-09-01 LAB — CBC
HCT: 30.2 % — ABNORMAL LOW (ref 36.0–46.0)
HCT: 37.8 % (ref 36.0–46.0)
Hemoglobin: 12.6 g/dL (ref 12.0–15.0)
MCHC: 33.5 g/dL (ref 30.0–36.0)
MCHC: 34.2 g/dL (ref 30.0–36.0)
MCV: 86.4 fL (ref 78.0–100.0)
MCV: 86.8 fL (ref 78.0–100.0)
Platelets: 233 10*3/uL (ref 150–400)
Platelets: 285 10*3/uL (ref 150–400)
RBC: 4.37 MIL/uL (ref 3.87–5.11)
RDW: 13.7 % (ref 11.5–15.5)
RDW: 13.8 % (ref 11.5–15.5)
WBC: 8.4 10*3/uL (ref 4.0–10.5)

## 2010-09-01 LAB — BASIC METABOLIC PANEL
BUN: 10 mg/dL (ref 6–23)
CO2: 24 mEq/L (ref 19–32)
Calcium: 9 mg/dL (ref 8.4–10.5)
Chloride: 97 mEq/L (ref 96–112)
Creatinine, Ser: 0.74 mg/dL (ref 0.4–1.2)
GFR calc Af Amer: >60 mL/min (ref >60)
GFR calc non Af Amer: >60 mL/min (ref >60)
Glucose, Bld: 115 mg/dL — ABNORMAL HIGH (ref 70–99)
Potassium: 3.6 mEq/L (ref 3.5–5.1)
Sodium: 132 mEq/L — ABNORMAL LOW (ref 135–145)

## 2010-09-01 LAB — GLUCOSE, CAPILLARY: Glucose-Capillary: 108 mg/dL — ABNORMAL HIGH (ref 70–99)

## 2010-09-01 LAB — PREGNANCY, URINE: Preg Test, Ur: NEGATIVE

## 2010-10-04 NOTE — Discharge Summary (Signed)
Janice Huang, Janice Huang          ACCOUNT NO.:  [id number]   MEDICAL RECORD NO.:  [id number]          PATIENT TYPE:  INP   LOCATION:  9320                          FACILITY:  [facility]   PHYSICIAN:  Charles A. Delcambre, MDDATE OF BIRTH:  02-04-1971   DATE OF ADMISSION:  08/13/2008  DATE OF DISCHARGE:  08/15/2008                               DISCHARGE SUMMARY   PRIMARY DISCHARGE DIAGNOSES:  1. Menorrhagia.  2. Uterine leiomyomata.  3. Pelvic pain.   PROCEDURES:  Transabdominal hysterectomy and left salpingo-oophorectomy.   PATHOLOGY:  Pending at this time.   DISPOSITION:  The patient was discharged to home.  She has Motrin 800 mg  1 p.o. q.8 h. p.r.n. at home prescription.  She also needs Percocet  5/325 one to two p.o. q.4 h. p.r.n. #30 prescriptions given.  She is to  take iron once a day and stool softener twice a day as needed.  She was  precautioned regarding no lifting greater than 25 pounds for 1 month, no  vaginal penetration, sexual activity for 6 weeks, no driving for 2  weeks, precaution for temperature greater than 100 degrees, increased  vaginal bleeding, and increased abdominal pelvic pain.   LABORATORY DATA:  Postoperative hemoglobin 10.3 and hematocrit 30.2.   SPECIMENS:  Uterus, left ovary, and tube to Pathology.   DESCRIPTION OF HOSPITAL STAY:  The patient was admitted, underwent  surgery as noted above.  During the procedure, left salpingo-  oophorectomy was done to achieve adequate hemostasis.  The patient was  so informed postoperatively, as we have planned on leaving the both  ovaries.  On day of surgery, she did have difficulty with pain control  from the Duramorph.  She was returned to PACU to undergo a dosing of her  epidural anesthesia, for the case was not epidural worked adequately.  Epidural was dosed and she was returned to the floor.  During the day on  postop day #1, epidural was weaned and she was switched over to p.o.  Motrin and Percocet.  Blood  pressures were okay at 150/82 on postop day  #1.  Urine output was good.  Laboratory returned as noted above.  Pain  was controlled at that time.  She had spontaneous return of flatus, was  ambulating without difficulty later in the day.  On postop day #2, she  continued to do well, flatus returned spontaneously, the pain was  tolerated and diet was tolerated.  Vitals were stable.  She was  afebrile.  Incision was dry and intact and for this reason, she was  ready for discharge and agreed.  She will return on August 18, 2008, for  staples to be discontinued.      Charles A. Sydnee Cabal, MD  Electronically Signed     CAD/MEDQ  D:  08/15/2008  T:  08/16/2008  Job:  [id number]

## 2010-10-04 NOTE — Op Note (Signed)
NAMEGLORY, GRAEFE          ACCOUNT NO.:  [id number]   MEDICAL RECORD NO.:  [id number]          PATIENT TYPE:  INP   LOCATION:  9320                          [facility]ILITY:  [facility]   PHYSICIAN:  Charles A. Delcambre, MDDATE OF BIRTH:  [birth year]   DATE OF PROCEDURE:  08/13/2008  DATE OF DISCHARGE:                               OPERATIVE REPORT   PREOPERATIVE DIAGNOSES:  1. Menorrhagia.  2. Fibroids.  3. Pelvic pain.   POSTOPERATIVE DIAGNOSES:  1. Menorrhagia.  2. Fibroids.  3. Pelvic pain.   PROCEDURES:  Transabdominal hysterectomy and left salpingo-oophorectomy.   SURGEON:  Charles A. Sydnee Cabal, MD   ASSISTANT:  Gerald Leitz, MD   COMPLICATIONS:  Bleeding ovarian pedicle on the left requiring  oophorectomy for hemostasis.  While entering the abdomen, I did injure  my left hand with a knife superficially.  During the case, I did cause a  needlestick to Dr. Richardson Dopp accidentally.   FINDINGS:  Normal ovaries, uterus 12-14 weeks' size with fibroids.  Uterus and left ovary and tube to Pathology.   ESTIMATED BLOOD LOSS:  400 mL.   Instrument, sponge, and needle count correct x2.   DESCRIPTION OF PROCEDURE:  The patient was taken to the operating room,  placed supine position after epidural was placed.  She was dosed and was  adequately comfortable.  During the case, she did require some sedation  later with bad dreams occurring.  Sterile prep and drape was undertaken.  Pfannenstiel incision was made just above where prior Pfannenstiel  incision had been made for cesarean sections.  Subcutaneous tissue and  fascia were taken down and nicked with a knife.  Mayo scissors were used  to extend the fascial incision bilaterally.  Rectus sheath was sharply  dissected superiorly and inferiorly.  Moderate adhesions were noted in  the dissection up at the top portion of the ankle, entry was made safely  to the intraperitoneal cavity.  There were some omental adhesions higher  than the  incision, then in like fashion, the fascia was taken off the  rectus muscles deeply in the midline without damage to the bladder.  Peritoneum was extended.  Traction was used to extend further.  Some  adhesions of the bladder to the uterus were taken down sharply.  Omental  adhesions to the left sidewall were taken down carefully.  Hemostasis  was excellent.  Round ligaments were grasped on either side with the  uterine cornual Kelly clamps.  Balfour retractor was placed.  Moistened  laps were used to pack the bowel out of the pelvis.  Round ligaments  were opened with cautery.  Vesicouterine peritoneum was taken across  anteriorly.  Transfixion stitches were used to hold the round ligaments.  Broad ligaments were opened bilaterally and the utero-ovarian pedicle  was isolated, free tied, and then transfixion stitch with hemostasis  good.  Further dissection down the bladder down the midline to develop  the bladder flap was undertaken.  With the bladder dropped down,  Zeppelin clamps were placed to take the uterine vessels.  These were  transfixion stitched on the right and simple stitch on the left.  Successive pedicles by necessity placed down the cardinal ligaments to  the vaginal angle on either side.  Hemostasis was excellent.  Vagina was  separated from the cervix sharply.  Uterus was turned over to go to  Pathology.  Later in the case, after Richardson angle sutures were  placed bilaterally and 0 Vicryl was used to run and lock the cuff, there  were areas of bleeding on the left ovary.  For this reason, oophorectomy  was done.  I placed a clamp directly up against the ovary, after  transfixion stitched and free tie, were excellent hemostasis thereafter.  The area of the posterior cuff and parietal peritoneum was closed over  with a 2-0 Vicryl in a running nonlocking suture.  Minor electrocautery  was used on the bladder around the cuff and on side pedicle.  Irrigation  was carried  out.  Hemostasis was excellent.  Avitene was placed in the  cuff in the bladder area.  Sutures were removed.  Balfour retractor was  removed.  Laps were removed.  Subfascial hemostasis was achieved by a  single stitch on the muscle superiorly and minor electrocautery in  different places, and 2-0 Vicryl was used to close the peritoneum  loosely.  No areas were left opened for herniation risk.  Subfascial  fascia was then closed with 0 PDS in a running nonlocking suture.  Subcutaneous hemostasis was excellent.  The large plain gut was placed 2-  0 to close the subcutaneous layer.  Sterile skin clips were used to  close the skin.  Sterile dressing was applied.  The patient tolerated  the procedure well and was taken to recovery with physician in  attendance.      Charles A. Sydnee Cabal, MD  Electronically Signed     CAD/MEDQ  D:  08/13/2008  T:  08/14/2008  Job:  [id number]

## 2010-10-07 NOTE — H&P (Signed)
NAME:  Janice Huang, Janice Huang                    ACCOUNT NO.:  [id number]   MEDICAL RECORD NO.:  [id number]                   PATIENT TYPE:  AMB   LOCATION:  SDC                                  [facility]ILITY:  [facility]   PHYSICIAN:  Naima A. Dillard, M.D.              DATE OF BIRTH:  [birth year]   DATE OF ADMISSION:  DATE OF DISCHARGE:                                HISTORY & PHYSICAL   CHIEF COMPLAINT:  Multiparity, desires tubal ligation.   HISTORY AND PHYSICAL:  The patient is a 40 year old African-American female  gravida 3, para 2, 1, 0, 3 who presents for a tubal ligation for  sterilization.  The patient has reviewed all forms of birth control, risks  and benefits, and the patient understands and desires sterilization.   PAST OBSTETRICAL HISTORY:  Past OB history if significant for C-section  times two and VBAC times one.   PAST MEDICAL HISTORY:  Past medical history is significant for  pyelonephritis years ago.   PAST SURGICAL HISTORY:  Past surgical history is significant for C-section  times two.   MEDICATIONS:  Medications include Vicodin and Motrin.   SOCIAL HISTORY:  Social history is negative for alcohol, tobacco and drug  use.   REVIEW OF SYSTEMS:  MUSCULOSKELETAL:  Significant for muscle strain  occurring a couple days ago when the patient slipped on a wet floor.  ENDOCRINE:  Unremarkable.  GENITOURINARY:  As above.  CARDIOVASCULAR:  Unremarkable.  GASTROINTESTINAL: Unremarkable.   PHYSICAL EXAMINATION:  VITAL SIGNS:  On physical exam the patient weighs 314  pounds.  HEENT:  Pupils are equal.  Hearing is normal.  Throat is clear.  NECK:  Thyroid is not enlarged.  HEART:  The heart has a regular rate and rhythm,  CHEST:  Chest is clear to auscultation bilaterally.  BREASTS:  Breasts have no masses, discharge, skin changes or nipple  retraction.  BACK:  The back has no CVA tenderness bilaterally.  ABDOMEN:  Abdomen is nontender without any masses or organomegaly.   Obese.  Her incision is clean, dry and intact.  EXTREMITIES:  The extremities have no cyanosis, clubbing or edema.  NEUROLOGIC:  Neuro exam is within normal limits.  VAGINAL EXAMINATION:  Vulvovaginal exam is within normal limits.  Cervix is  nontender without lesions.  Uterus is normal shape size and consistency.  Adnexa; no masses.   ASSESSMENT:  Multiparity, desires sterilization with laparoscopic tubal  ligation with possible laparotomy.   The patient understands that the risks are, but not limited to failure rate  of about 1:200 to 1:300, that 60% of the failures could present as an  ectopic pregnancy.  She also understands the risks are, but not limited to  bleeding, infection, and/or damage to internal organs such as bowel, bladder  and major blood vessels.  Naima A. Normand Sloop, M.D.    NAD/MEDQ  D:  11/17/2003  T:  11/17/2003  Job:  [id number]

## 2010-10-07 NOTE — H&P (Signed)
NAME:  Janice Huang, Janice Huang                      ACCOUNT NO.:  [id number]   MEDICAL RECORD NO.:  [id number]                   PATIENT TYPE:  MAT   LOCATION:  MATC                                 [facility]   PHYSICIAN:  Naima A. Dillard, M.D.              DATE OF BIRTH:  [birth year]   DATE OF ADMISSION:  08/26/2003  DATE OF DISCHARGE:  08/26/2003                                HISTORY & PHYSICAL   This is a dictation from a visit I saw the patient on August 26, 2003 at 4:30  p.m. in maternity admissions.   BRIEF HISTORY:  Patient is a 40 year old African-American female gravida 2  para 2 with an estimated date of delivery of October 31, 2003 who presented at  30-4/7 weeks to maternity admissions complaining of some abdominal pain.  Patient has had PPROM since August 09, 2003 and she left against medical  advice and has been followed by a nurse at home and seen at [facility]  as needed once a week.  Patient presented on August 26, 2003 with constant  abdominal pain, increased leakage of fluid, positive fetal movement, no  contractions, no vaginal bleeding, no fevers or chills, and no UTI symptoms.  Her temperature was 98.1, pulse was 115, respiratory rate was 22, blood  pressure was 139/58, pulse oximetry was 97-99% on room air.  Fetal heart  tones were 145-150 with accelerations.  Her fasting blood sugar was 97-112.  Tocometer had occasional contractions and 2-hour postprandial's were  anywhere between 103 to 120.  Heart had increased rate and rhythm.  Lungs  were clear to auscultation bilaterally.  Abdomen was gravid, soft, with a  mild abdominal tenderness at the umbilicus.  On sterile speculum exam there  was no blood in the vault with clear fluid, her cervix appeared thick and  closed.  Her white count was 8.5, hemoglobin was 11.7, platelets were 282.  Patient had PPROM with abdominal pain at 30 weeks.  We decided to get an  ultrasound which still demonstrated that she was breech  with an AFI of 1.9,  estimated fetal weight of 1755 g, her differential had 67 segs, her Chem-7  was normal.   ASSESSMENT:  Pregnancy at 30-4/7 weeks with preterm contractions, abdominal  pain; her laboratories were not suggestive of chorioamnionitis.   PLAN:  Patient is recommended to stay in the hospital for monitoring.  Patient was told that she may go into labor and we could offer her tocolysis  or delivery, we could also monitor for fetal distress which could occur with  the decreased amniotic fluid level, also could monitor patient to ensure she  is not developing chorioamnionitis.  Patient declined and left against  medical advice.  She understood that the risks were but not limited to  bleeding, infection, chorioamnionitis, fetal and maternal death.  She was  told to monitor kick counts, call for a temperature greater than 100.4 or  greater than 4-5 contractions an hour, vaginal bleeding, or decreased fetal  movement.  Patient agreed to call with any problems.                                               Naima A. Normand Sloop, M.D.    NAD/MEDQ  D:  08/30/2003  T:  08/31/2003  Job:  [id number]

## 2010-10-07 NOTE — Op Note (Signed)
NAME:  Janice Huang, Janice Huang                    ACCOUNT NO.:  [id number]   MEDICAL RECORD NO.:  [id number]                   PATIENT TYPE:  AMB   LOCATION:  SDC                                  [facility]   PHYSICIAN:  Janice A. Dillard, M.D.              DATE OF BIRTH:  03/23/1971   DATE OF PROCEDURE:  11/18/2003  DATE OF DISCHARGE:                                 OPERATIVE REPORT   PREOPERATIVE DIAGNOSES:  Multiparity, desires laparoscopic tubal ligation.   POSTOPERATIVE DIAGNOSES:  Multiparity, desires laparoscopic tubal ligation.  Uterine fibroids, pelvic abdominal adhesions.   PROCEDURE:  Operative laparoscopy, lysis of adhesions, bilateral tubal  ligation and removal of anterior cul-de-sac mass.   ANESTHESIA:  General endotracheal tube.   ESTIMATED BLOOD LOSS:  Minimal.   IV FLUIDS:  1700 mL crystalloid.   URINE OUTPUT:  About 100 mL clear urine.   FINDINGS:  Large anterior abdominal wall adhesion from the omentum to the  abdominal wall.  The uterus was about 10 week size with multiple fibroids,  the largest being about 3-4 cm on the anterior side of the uterus, a smaller  one about 2 cm on the posterior side of the uterus.  The patient had normal  appearing tubes and ovaries bilaterally. She had uterine anterior abdominal  wall adhesions, she also had left side omental pelvic sidewall and uterine  adhesions.   COMPLICATIONS:  None.   The patient went to the recovery room in stable condition.   DESCRIPTION OF PROCEDURE:  The patient was taken to the operating room where  she was given general anesthesia, placed in dorsal lithotomy position and  prepped and draped in the normal sterile fashion.  A bivalve speculum was  placed into the uterine cavity, uterus sounded to 8 cm. The single tooth  tenaculum was placed on the anterior lip of the cervix, the Hulka  manipulator was then placed into the uterine cavity, single tooth tenaculum  was then removed. Attention was  then turned to the umbilicus where a 10 mm  infraumbilical incision was made with the scalpel after 5 mL of 0.25%  Marcaine was infiltrated into the skin. The Veress needle was then placed at  a 45 degree angle while tinting the abdominal wall.  Intraabdominal  placement was confirmed with fluid filled syringe, abdomen was insufflated  with 3 liters CO2 gas. An 11 mm trocar was then placed at a 45 degree angle  while tinting the abdominal wall.  Intraabdominal placement was confirmed  with the laparoscope.  The findings noted above were seen.  The large  omental abdominal wall adhesion was cauterized and cut, hemostasis was  assured, there was no injury noted to the bowel. Because of her multiple  adhesions, a second 5 mm trocar was placed 2 cm above the symphysis pubis in  her previous cesarean section scar, hemostasis was assured.  The pelvic  sidewall adhesions and uterine adhesions were also  lysed both sharply and  bluntly. 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  The patient's appendix was seen and noted to be  normal. The liver was normal. The bowels were seen to be normal.  The  patient's left fallopian tube was identified and grasped with atraumatic  grasper and the mid isthmic portion of the tube was fulgurated with  Kleppinger's x3, hemostasis noted.  The patient's right fallopian tube was  manipulated in a similar fashion. The mid isthmic portion of the tube was  fulgurated x3.  Hemostasis was assured paying careful attention not to  fulgurate any adjacent organs or burn any adjacent organs.  The trocars were  removed under direct visualization. The umbilical fascia was closed with #0  Vicryl in a pursestring fashion. The skin incisions were closed with 3-0  Monocryl, the Hulka tenaculum was removed. Sponge, lap and needle counts  were correct x2.  The patient went to the recovery room in stable condition.                                               Janice Huang, M.D.    NAD/MEDQ  D:  11/18/2003  T:  11/18/2003  Job:  [id number]

## 2010-10-07 NOTE — H&P (Signed)
NAMESAXON, CROSBY                      ACCOUNT NO.:  [id number]   MEDICAL RECORD NO.:  [id number]                   PATIENT TYPE:  INP   LOCATION:  9159                                 [facility]   PHYSICIAN:  Janine Limbo, M.D.            DATE OF BIRTH:  1970-09-04   DATE OF ADMISSION:  08/09/2003  DATE OF DISCHARGE:                                HISTORY & PHYSICAL   HISTORY OF PRESENT ILLNESS:  Ms. Koranda is a 40 year old gravida 3 para 2-  0-0-2 at 79 and one-seventh weeks gestation.  EDD October 31, 2003 determined  by 8-week 4-day ultrasound and confirmed with follow-up.  She reports  spontaneous rupture of membranes at home this a.m. for clear fluid at  approximately 9 a.m.  The patient noticed gush of fluid following using the  rest room and monitored the situation throughout the morning.  She continued  to have gushes of fluid and then upon wearing a sanitary pad this became  saturated with fluid at which point she called to report rupture of  membranes.  She reports positive fetal movement, no bleeding.  She is not  having any contractions, cramping, or pain.  She did have an upper  respiratory infection approximately 2 weeks ago and was medicated with a Z-  Barbee Cough on July 24, 2003.  She has felt better in the last 2 days, has not had  any fever during this period.  Her nasal drainage is clear.  She is only  occasionally using Sudafed and Robitussin right now.  She is not having any  nausea or vomiting; no pain or burning with urination; and no PIH symptoms;  no headache, visual changes, or epigastric pain.  Her pregnancy has been  followed by the M.D. service at [facility] and is remarkable for:  1. Obesity.  2. Previous cesarean section with subsequent VBAC.  3. Desires BTL.  4. Fibroids.  5. Gestational diabetes.   This patient was initially evaluated at the office of [facility] on March 23, 2003 at 8 weeks 4 days gestation by ultrasound.  Her pregnancy has been  complicated with diagnosis of gestational diabetes at 18 weeks.  She is also  measuring size greater than dates and was instructed to have follow-up  ultrasounds at 18 and at [redacted] weeks gestation.  Her fasting blood sugars were  on July 27, 2003 93, March 8 fasting blood sugar was 101, March 9 fasting  blood sugar of 103, March 10 fasting blood sugar of 108, and March 11  fasting blood sugar of 118.  Otherwise, she has been normotensive with no  proteinuria.   PRENATAL LABORATORY DATA:  Prenatal lab work on March 23, 2003 shows  hemoglobin and hematocrit 12.4 and 37.2; platelets 304,000.  Blood type and  Rh A positive, antibody screen negative.  Sickle cell trait negative.  VDRL  nonreactive.  Rubella immune.  Hepatitis B surface antigen negative.  Pap  smear within normal limits.  GC and chlamydia negative.  CF testing  declined.  At 18 weeks, one-hour glucose was elevated and three-hour GTT  positive for gestational diabetes.   OBSTETRICAL HISTORY:  In 1992 the patient had a primary low transverse  cesarean section at 42 weeks with the birth of a 7-pound 12-ounce female  infant, Creasie Lacosse, delivered by Dr. Pennie Rushing secondary to fetal heart  rate bradycardia.  In 1996 the patient had a successful VBAC with the birth  of a 7-pound 14-ounce female infant at 27 weeks named Reginia Naas with no  complications.   MEDICAL HISTORY:  History of fibroid tumors.  The patient is morbidly obese  at 329 pounds, pre pregnant weight 313 pounds.   SURGICAL HISTORY:  C-section in 1992.   FAMILY HISTORY:  Paternal grandmother and maternal grandmother with a  history of hypertension.  Maternal grandmother with CHF.  Maternal  grandmother and paternal grandmother with diabetes.   GENETIC HISTORY:  There is no genetic history of familial or chromosomal  disorders, children that died in infancy or that were born with birth  defects.   ALLERGIES:  The patient has an allergy to CODEINE which causes  itching.   She denies the use of tobacco, alcohol, or illicit drugs.   SOCIAL HISTORY:  Ms. Kot is a 40 year old African-American female.  The  father of her baby is Annamaria Boots.  He is engaged to the patient and  involved and supportive.  They are [place] in their faith.   REVIEW OF SYSTEMS:  As described above.  The patient is currently at [redacted]  weeks gestation with premature preterm rupture of membranes at home for  clear fluid with no labor.   PHYSICAL EXAMINATION:  VITAL SIGNS:  Stable, afebrile.  HEENT:  Unremarkable.  HEART:  Regular rate and rhythm.  LUNGS:  Clear.  ABDOMEN:  Gravid in its contour.  Uterine fundus is noted to measure 31 cm  above the level of the pubic symphysis.  Ultrasound examination will  determine presentation of fetus.  Fetal heart rate is in the 150s with  average long-term variability, positive accelerations.  It is difficult to  obtain continuous tracing secondary to early gestation and maternal body  habitus.  There are no contractions noted on external fetal monitoring.  PELVIC:  Sterile speculum exam finds negative pooling, positive nitrazine,  positive fern.  Cervix is long and closed with the presenting part high.  GC  and chlamydia, group B strep, and a wet prep were obtained with exam.  EXTREMITIES:  Show no pathologic edema.  DTRs are 1+ with no clonus.   ASSESSMENT:  Intrauterine pregnancy at 28 and one-seventh weeks with preterm  premature rupture of membranes.   PLAN:  1. Admit per Dr. Dierdre Forth.  2. Complete OB ultrasound with AFI.  Dr. Pennie Rushing will be in to discuss     options with the patient following ultrasound.     Rica Koyanagi, C.N.M.               Janine Limbo, M.D.    SDM/MEDQ  D:  08/09/2003  T:  08/10/2003  Job:  [id number]

## 2010-10-07 NOTE — Discharge Summary (Signed)
Janice Huang, Janice Huang                      ACCOUNT NO.:  [id number]   MEDICAL RECORD NO.:  [id number]                   PATIENT TYPE:  INP   LOCATION:  9159                                 FACILITY:  [facility]   PHYSICIAN:  Osborn Coho, M.D.                DATE OF BIRTH:  07-Jul-1970   DATE OF ADMISSION:  08/09/2003  DATE OF DISCHARGE:  08/14/2003                                 DISCHARGE SUMMARY   DISPOSITION:  The patient signed out AMA August 14, 2003.   ADMITTING DIAGNOSES:  74. A 40 year old gravida 3 para 1 at 28 weeks with premature prolonged     rupture of membranes.  2. Breech presentation.  3. Previous cesarean section followed by vaginal birth after cesarean.  4. Gestational diabetes.  5. Fibroids.   DISCHARGE DIAGNOSES:  1. Stable preterm premature rupture of membranes.  2. Gestational diabetes with improving glucose readings.  3. Oligohydramnios secondary to preterm prolonged rupture of membranes.  4. Prior cesarean section with subsequent vaginal birth after cesarean.  5. Breech presentation.   PROCEDURES:  1. Electronic fetal monitoring.  2. Betamethasone.  3. Insulin coverage for hyperglycemia.  4. Antibiotics.   HOSPITAL COURSE:  Janice Huang is a 40 year old gravida 3 para 2-0-0-2 who  is admitted on August 09, 2003 at 28 and one-seventh weeks secondary to  spontaneous rupture of membranes with clear fluid noted.  The patient denied  any cramping.  She had had an upper respiratory infection for 2 weeks and  had been medicated with a Z-Pak.  She denied any fever.  On admission  rupture of membranes was confirmed.  The cervix was long and closed with the  presenting part out of the pelvis. Her initial temperature was 100; however,  after arriving on the antenatal floor she was afebrile.  She had an  ultrasound that showed low normal fluid, estimated fetal weight of 1114 g,  cervix of 3.5 cm, and breech presentation was noted.  The decision was made  to admit  her for bedrest with bathroom privileges, betamethasone, insulin  coverage for hyperglycemia, antibiotics until cultures were available,  p.r.n. tocolysis prior to completion of betamethasone, and a NICU consult.   The patient's pregnancy had been remarkable for:  1. Obesity.  2. Previous cesarean section with subsequent VBAC.  3. Desires tubal ligation.  4. Fibroids.  5. Gestational diabetes, diet controlled.   During the course of her care the patient was noted to have stressful  situations at home related to her education and financial situation.  She  had two other children who had limited resources for care.  Through the rest  of her hospital course she denied any significant leaking.  Group B strep,  GC, and chlamydia cultures were all done which were negative.  Her wbc count  remained normal.  Her fasting CBGs were in the 120-130s.  She received two  doses of betamethasone.  Social work was  consulted.  Urine culture was also  negative.  By the patient's hospital day #2 she began to express a desire to  go home.  She had an ultrasound on March 24 that showed an AFI of 6.3.  Dr.  Pennie Rushing had an lengthy discussion with the patient regarding her desire to  go home.  Recommendations were made for in-hospital monitoring for  contractions, a repeat ultrasound in 72 hours, and if normal AFI,  amniocentesis would be offered for installation of indigo carmine and if no  leakage was to be discharged home.  Continue glucose monitoring and  amniocentesis for maturity at 32 weeks.  The patient continued to indicate  that she needed to go home to facilitate care for her other two children.  Risks of an unattended vaginal breech delivery were noted to the patient as  well as risks of infection and all other risks of her premature ruptured  membrane status.  Despite the recommendation for the patient to remain the  patient continued to report that she was going to sign out against medical   advice.  In light of that, Dr. Pennie Rushing worked to arrange for some home  health monitoring.  Dr. Su Hilt saw the patient on the morning of March 25  at the time when the patient advised she was going to sign out against  medical advice.  The patient verbalized that she understood the significance  of her situation and the risks of her situation including but not limited to  fetal death.  Home health had been arranged for visits every week and twice  a week if covered.  The patient was instructed on home care and she elected  to leave against medical advice.  She seemed to understand the instructions  and was escorted out per her request.   DISCHARGE INSTRUCTIONS:  1. The patient is to continue on bedrest.  2. The patient is to monitor her temperature and to call if greater than or     equal to 100.4.  3. She is to notice if her leaking of fluid increases and develops any odor     or change in color.  4. She is also to note if any decreased fetal movement occurs or any other     problems.  5. She is still to monitor her blood sugars at home as previously noted.   DISCHARGE MEDICATIONS:  Prenatal vitamins one p.o. daily.   DISCHARGE FOLLOW-UP:  Is to occur this coming week at the office with the  patient indicating she will call on Monday to make that appointment.  Home  monitoring is continuing to be investigated through [place], with the goal  being twice daily.  Janice Huang will send a R.N. at least weekly with two times a  week if covered, and monitoring if covered.     Janice Huang, C.N.M.                   Osborn Coho, M.D.    VLL/MEDQ  D:  08/14/2003  T:  08/14/2003  Job:  [id number]

## 2010-10-07 NOTE — Discharge Summary (Signed)
[name], Janice Huang                    ACCOUNT NO.:  [id number]   MEDICAL RECORD NO.:  [id number]                   PATIENT TYPE:  INP   LOCATION:  9122                                 [facility]ILITY[facility]   PHYSICIAN:  Hal Morales, M.D.             DATE OF BIRTH:  10-30-1970   DATE OF ADMISSION:  08/30/2003  DATE OF DISCHARGE:  09/02/2003                                 DISCHARGE SUMMARY   ADMITTING DIAGNOSES:  1. Preterm labor.  2. Preterm premature rupture of membranes.  3. Vaginal bleeding with possible abruption.  4. Gestational diabetes.  5. Intrauterine pregnancy at 30 weeks.   PROCEDURE:  Repeat cesarean delivery.   DISCHARGE DIAGNOSES:  1. Preterm labor.  2. Preterm premature rupture of membranes.  3. Vaginal bleeding with possible abruption.  4. Gestational diabetes.  5. Intrauterine pregnancy at 30 weeks.  6. Repeat cesarean delivery.  7. Breech presentation.  8. Fibroids.  9. Intraoperative hemorrhage.   [name] is a 40 year old gravida 3 para 2-0-0-2 who presented at [redacted]  weeks gestation having been followed for preterm premature rupture of  membranes since 27 weeks.  On this admission the patient presented with  complaint of vaginal bleeding, cramping, and contractions.  In light of all  of the above, repeat cesarean delivery was recommended and accepted by the  patient.  This was accomplished by Dr. Jaymes Graff with the birth of a 4-  pound 1-ounce female infant named Danny Lawless from the breech presentation  with Apgar scores of 5 at one minute and 9 at five minutes.  Cord pH was  7.33.  Large veins were noted in the lower anterior uterine segment and the  patient had increased operative blood loss resulting in an intraoperative  hemorrhage.  H&H 4 hours after surgery had dropped from 12.5 to 9.3 and on  postoperative day #1 her hemoglobin was 8.1 and has stabilized at 7.1 on  postoperative days #2 and #3.  The patient has remained afebrile  and is not  experiencing any dizziness or syncope when up and standing.  Her orthostatic  vital signs were within normal limits.  Transfusion was discussed with the  patient thoroughly and she has declined.  She has remained stable and is  doing well throughout the postoperative period and on this her postoperative  day #3 she is judged to be in satisfactory condition for discharge.  Her  baby has done well in the NICU.  She spoke with the social worker regarding  baby's stay in the NICU this morning and estimated stay is approximately 2-3  weeks.  The patient's incision is healing with no signs or symptoms of  infection.  However, she is at increased risk for wound infection and  therefore will discharged home and return to the office on September 04, 2003  for staple removal.   DISCHARGE INSTRUCTIONS:  Per [employer]   DISCHARGE MEDICATIONS:  1. Motrin  600 mg p.o. q.6h. p.r.n. pain.  2. Tylox  one to two p.o. q.3-4h. p.r.n. pain.  3. Prenatal vitamins.   The patient will follow up in the office of [facility] on April 15 for staple  removal and as needed from that point.     Rica Koyanagi, C.N.M.               Hal Morales, M.D.    SDM/MEDQ  D:  09/02/2003  T:  09/02/2003  Job:  [id number]

## 2010-10-07 NOTE — Op Note (Signed)
NAME:  Janice Huang, Janice Huang                    ACCOUNT NO.:  [id number]   MEDICAL RECORD NO.:  [id number]                   PATIENT TYPE:  INP   LOCATION:  9198                                 [facility]ILITY[facility]   PHYSICIAN:  Naima A. Dillard, M.D.              DATE OF BIRTH:  1971-01-12   DATE OF PROCEDURE:  08/30/2003  DATE OF DISCHARGE:                                 OPERATIVE REPORT   PREOPERATIVE DIAGNOSES:  1. Preterm labor.  2. Preterm prolonged rupture of membranes.  3. Vaginal bleeding with possible abruption.  4. Gestational diabetes at 31 weeks.   POSTOPERATIVE DIAGNOSES:  1. Preterm labor.  2. Preterm prolonged rupture of membranes.  3. Vaginal bleeding with possible abruption.  4. Gestational diabetes at 31 weeks.  5. Intraoperative hemorrhage.   PROCEDURE:  Repeat cesarean section.   SURGEON:  Naima A. Normand Sloop, M.D.   ASSISTANT:  Rica Koyanagi, C.N.M.   ANESTHESIA:  Spinal.   ESTIMATED BLOOD LOSS:  2300 mL.   URINE OUTPUT:  350 mL clear urine.   FLUIDS REPLACED:  4000 mL crystalloid.   COMPLICATIONS:  None.   FINDINGS:  A female infant in breech presentation with Apgars of 5 and 8.  There were large veins in the lower anterior uterine segment.  There were  multiple small fibroids about 1-2 cm along the anterior and posterior wall  of the uterus.  Some were exophytic and some subserosal, about 10 in total.  The placenta did have a small clot behind it and seemed to be somewhat  detached, consistent with abruption.  It was sent to pathology.  The baby's  cord pH was 7.33.  The patient had normal-appearing tubes and ovaries  bilaterally.  The patient went to the recovery room in stable condition.   PROCEDURE IN DETAIL:  The patient was taken to the operating room.  Before  the procedure the patient was told of the risks of the procedure including  but not limited to risk from anesthesia, bleeding, infection, damage to  internal organs such as bowel,  bladder, and major blood vessels.  The  patient agreed to proceed.   She was taken to the operating room, given spinal anesthesia, placed in the  dorsal supine position with left lateral tilt, prepped and draped in a  normal sterile fashion, a Foley catheter was placed.  A Pfannenstiel skin  incision was then made with a scalpel along the previous incision and  carried down to the fascia using Bovie cautery.  The fascia was incised in  the midline, extended bilaterally using Bovie cautery.  Kochers x2 were  placed on the superior aspect of the fascia, which was dissected off the  rectus muscles both sharply and bluntly using Bovie cautery.  The inferior  aspect of the fascia was dissected off the rectus muscles in a similar  fashion.  The rectus muscles were then separated in the midline.  The  peritoneum  was identified, tented up, and entered sharply and separated.  The vesicouterine peritoneum was identified, tented up, and entered sharply  and extended bilaterally.  A bladder blade was inserted.  A lower transverse  uterine incision was then made with a scalpel and extended bilaterally with  bandage scissors.  At this time there were huge veins in the lower uterine  segment.  Some of them in the process of doing the cesarean section began to  bleed heavily.  Those were tried to make hemostatic with a Kelly clamp.  There was no fluid seen.  The infant was noted to be in frank breech  presentation and delivered with normal breech maneuvers without difficulty.  The cord was clamped and cut, cord gases obtained and noted to be 7.44.  Baby did cry upon delivery.  The placenta was delivered without difficulty.  The uterus was exteriorized and cleared of all clot and debris, and findings  as noted above were seen.  The uterine incision was repaired with 0 Vicryl  in a running locked fashion.  A second layer for imbrication was used and  some figure-of-eight stitches were also used for  hemostasis.  Second layer  of imbrication was basically figure-of-eight stitches for hemostasis.  The  uterus was returned to the abdomen, irrigation was done, and the uterus was  noted to be hemostatic.  The fascia was closed with 0 Vicryl in a running  fashion.  The rectus muscles were examined and noted to be hemostatic.  Therefore the fascia was closed.  The peritoneum was closed with 0 chromic  in a running fashion and before the fascia was closed.  The rectus muscles  were noted to be hemostatic.  A JP drain was placed through the subcutaneous  fascia.  The subcutaneous fascia was reapproximated with 2-0 plain.  The  skin was closed with staples.  Sponge, lap, and needle counts were correct  x2.  The patient went to the recovery room in stable condition.                                               Naima A. Normand Sloop, M.D.    NAD/MEDQ  D:  08/30/2003  T:  08/31/2003  Job:  [id number]

## 2010-10-07 NOTE — H&P (Signed)
NAME:  Janice Huang, Janice Huang                    ACCOUNT NO.:  [id number]   MEDICAL RECORD NO.:  [id number]                   PATIENT TYPE:  INP   LOCATION:  9198                                 [facility]   PHYSICIAN:  Naima A. Dillard, M.D.              DATE OF BIRTH:  1970-11-15   DATE OF ADMISSION:  08/30/2003  DATE OF DISCHARGE:                                HISTORY & PHYSICAL   REASON FOR ADMISSION:  Janice Huang is a 40 year old gravida 3, para 2-0-0-2  at [redacted] weeks gestation, EDD October 31, 2003 by early ultrasound confirmed with  follow up.  This patient presents after calling with report of a spot of  blood approximately 11 or 12 a.m. this morning when she went to the  bathroom.  She noticed a spot of blood in her urine and as the patient is  currently wearing a pad for a known history of rupture of membranes she  noted a small amount of blood on the pad as well.  She was instructed to  report to the Maternity Admissions Unit at [facility] for evaluation  as soon as possible.  She has had positive fetal movement.  She did not have  any cramping or pain until she noticed the bleeding and since that time has  noted some cramping and contractions which are becoming somewhat stronger  and closer together.  Her pregnancy has been followed by the M.D. service of  [facility] and is significant for preterm premature rupture of membranes since [redacted]  weeks gestation.  The patient has been followed on an outpatient basis  because she left the hospital AMA.  She has been followed regularly and all  ultrasounds and lab work testing has been within normal for her appropriate  gestational age with exception of rupture of membranes.  Ultrasound on  Friday, April 8th, found a 31-week 3-day gestation intrauterine with  premature rupture of membranes.  Her AFI on that day was 8 cm.  Placenta is  posterior grade 1 with no previa.  The baby is in the breech position.  Bedside ultrasound by Dr.  Jaymes Graff on admission also confirms baby  remains in breech position.  Because of history of rupture of membranes and  bleeding, recommendation has been for repeat cesarean delivery today and the  patient is in agreement.  She has a history of a previous C-section with her  first pregnancy and a subsequent VBAC with her second pregnancy.  She  desires postpartum bilateral tubal sterilization.  She does have a history  of fibroids and she has been found to have gestational diabetes with this  pregnancy which has been diet controlled.  She received betamethasone on her  admission with premature rupture of membranes at [redacted] weeks gestation.   OBSTETRICAL HISTORY:  In 1992, the patient had primary C-section with the  birth of a 7 pound 12 ounce female infant named Janice Huang at 42 weeks.  In 1996,  the patient had a VBAC with the birth of a 7 pound 14 ounce female infant  Janice Huang with no complications.   MEDICAL HISTORY:  Significant for fibroids, UTIs.  The patient did have  pyelonephritis as a child.   FAMILY HISTORY:  Maternal grandmother with a history of congestive heart  failure.  Maternal grandmother and paternal grandmother with a history of  hypertension.  Maternal grandmother and paternal grandmother with diabetes.   GENETIC HISTORY:  There is no genetic history of familial or chromosomal  disorders, children that died in infancy, or that were born with birth  defects.   The patient has a medication ALLERGY to CODEINE which causes itching.  No  latex allergy.   She denies the use of tobacco, alcohol, or illicit drugs.   SOCIAL HISTORY:  [name] is a 40 year old African-American single  female.  The father of her baby, Janice Post. Janice Huang, is involved and  supportive.  He is present with the patient.  They are [place] in their  faith.   PRENATAL LABORATORY DATA:  On March 13, 2003, hemoglobin and hematocrit  12.4 and 37.2, platelets 304,000.  Blood type and Rh A  positive, antibody  screen negative, sickle cell trait negative, VDRL nonreactive, rubella  immune, hepatitis B surface antigen negative, HIV declined.  Pap smear  within normal limits.  GC and Chlamydia negative.  Cultures since that time  on admission at 27 weeks found group B strep negative, GC and Chlamydia  negative.  Her CBC from April 6th found WBCs 8.7, hemoglobin 11.7,  hematocrit 35, and platelets 284,000, neutrophils 67 with no change in the  differential.  Her chemistries were all within normal limits except her  glucose.  She is a known gestational diabetic and has been following a  diabetic diet.   REVIEW OF SYSTEMS:  The patient is [redacted] weeks gestation with vaginal bleeding  and cramping, known preterm premature rupture of membranes.  She is admitted  for repeat cesarean delivery.   PHYSICAL EXAMINATION:  VITAL SIGNS:  Stable.  Afebrile.  HEENT:  Unremarkable.  HEART:  Regular rate and rhythm.  LUNGS:  Clear.  ABDOMEN:  Gravid in its contour.  Uterine fundus is noted to extend 32 cm  above the level of the pubic symphysis.  Leopold's maneuver finds the infant  to be in a breech presentation.  Breech presentation is confirmed by bedside  ultrasound by Dr. Normand Sloop.  Most recent ultrasound on Friday, April 8th,  finds estimated fetal weight approximately 4 pounds.  The fetal heart rate  is 150s with positive variability, one deep variable to 50s x45 seconds with  good recovery.  Abdomen is gravid, soft, and nontender.  CVAT is negative.  EXTREMITIES:  Show no pathologic edema.  DTRs are 1+ with no clonus.  PELVIC:  Sterile speculum exam by Dr. Normand Sloop finds cervix appears fingertip  dilated.  Large amounts of amniotic fluid and blood is seen exiting the  cervical os.   ASSESSMENT:  Intrauterine pregnancy at [redacted] weeks gestation, gestational  diabetes A1 in labor, possible abruption.   PLAN:  Recommend repeat C-section.  The patient's options including the risks of cesarean  delivery, bleeding, infection, and possible damage to  adjacent organs have been discussed thoroughly with the patient and she has  agreed to this plan.  The patient had made decision not to have postpartum  tubal ligation at this time.     Rica Koyanagi, C.N.M.  Naima A. Normand Sloop, M.D.    SDM/MEDQ  D:  08/30/2003  T:  08/30/2003  Job:  [id number]

## 2010-12-15 ENCOUNTER — Ambulatory Visit: Payer: Self-pay | Admitting: Internal Medicine

## 2010-12-16 ENCOUNTER — Ambulatory Visit (INDEPENDENT_AMBULATORY_CARE_PROVIDER_SITE_OTHER): Payer: 59 | Admitting: Internal Medicine

## 2010-12-16 ENCOUNTER — Encounter: Payer: Self-pay | Admitting: Internal Medicine

## 2010-12-16 VITALS — BP 122/84 | HR 81 | Temp 98.1°F | Resp 16 | Wt 334.0 lb

## 2010-12-16 DIAGNOSIS — I10 Essential (primary) hypertension: Secondary | ICD-10-CM

## 2010-12-16 DIAGNOSIS — E785 Hyperlipidemia, unspecified: Secondary | ICD-10-CM | POA: Insufficient documentation

## 2010-12-16 DIAGNOSIS — Z1231 Encounter for screening mammogram for malignant neoplasm of breast: Secondary | ICD-10-CM

## 2010-12-16 DIAGNOSIS — E7800 Pure hypercholesterolemia, unspecified: Secondary | ICD-10-CM

## 2010-12-16 MED ORDER — LISINOPRIL-HYDROCHLOROTHIAZIDE 20-25 MG PO TABS: 1.0000 | ORAL_TABLET | Freq: Every day | ORAL | Status: DC

## 2010-12-16 NOTE — Assessment & Plan Note (Signed)
She is doing well on her current regimen so she will continue that, today I will check her lytes and renal function

## 2010-12-16 NOTE — Patient Instructions (Signed)

## 2010-12-16 NOTE — Assessment & Plan Note (Signed)
I talked to her about lifestyle modifications and today I will check her FLP, TSH, and CMP

## 2010-12-16 NOTE — Progress Notes (Signed)
  Subjective:    Patient ID: Janice Huang, female    DOB: 1970/09/15, 40 y.o.   MRN: [id number]  Hypertension This is a chronic problem. The current episode started more than 1 year ago. The problem has been gradually improving since onset. The problem is controlled. Pertinent negatives include no anxiety, blurred vision, chest pain, headaches, malaise/fatigue, neck pain, orthopnea, palpitations, peripheral edema, PND, shortness of breath or sweats. Past treatments include ACE inhibitors and diuretics. The current treatment provides significant improvement. Compliance problems include exercise and diet.  There is no history of chronic renal disease.  Hyperlipidemia This is a recurrent problem. The current episode started more than 1 year ago. The problem is uncontrolled. Recent lipid tests were reviewed and are variable. Exacerbating diseases include obesity. She has no history of chronic renal disease, diabetes, hypothyroidism, liver disease or nephrotic syndrome. Factors aggravating her hyperlipidemia include no known factors. Pertinent negatives include no chest pain, focal sensory loss, focal weakness, leg pain, myalgias or shortness of breath. She is currently on no antihyperlipidemic treatment. Compliance problems include adherence to diet and adherence to exercise.       Review of Systems  Constitutional: Negative.  Negative for malaise/fatigue.  HENT: Negative.  Negative for neck pain.   Eyes: Negative.  Negative for blurred vision.  Respiratory: Negative.  Negative for shortness of breath.   Cardiovascular: Negative.  Negative for chest pain, palpitations, orthopnea and PND.  Gastrointestinal: Negative.   Genitourinary: Negative.   Musculoskeletal: Negative.  Negative for myalgias.  Skin: Negative.   Neurological: Negative.  Negative for focal weakness and headaches.  Hematological: Negative.   Psychiatric/Behavioral: Negative.        Objective:   Physical Exam  Vitals  reviewed. Constitutional: She is oriented to person, place, and time. She appears well-developed and well-nourished. No distress.  HENT:  Head: Normocephalic and atraumatic.  Right Ear: External ear normal.  Left Ear: External ear normal.  Nose: Nose normal.  Mouth/Throat: Oropharynx is clear and moist. No oropharyngeal exudate.  Eyes: Conjunctivae and EOM are normal. Pupils are equal, round, and reactive to light. Right eye exhibits no discharge. Left eye exhibits no discharge. No scleral icterus.  Neck: Normal range of motion. Neck supple. No JVD present. No tracheal deviation present. No thyromegaly present.  Cardiovascular: Normal rate, regular rhythm, normal heart sounds and intact distal pulses.  Exam reveals no gallop and no friction rub.   No murmur heard. Pulmonary/Chest: Effort normal and breath sounds normal. No stridor. No respiratory distress. She has no wheezes. She has no rales. She exhibits no tenderness.  Abdominal: Soft. Bowel sounds are normal. She exhibits no distension and no mass. There is no tenderness. There is no rebound and no guarding.  Musculoskeletal: Normal range of motion. She exhibits no edema and no tenderness.  Lymphadenopathy:    She has no cervical adenopathy.  Neurological: She is alert and oriented to person, place, and time. She has normal reflexes. She displays normal reflexes. No cranial nerve deficit. She exhibits normal muscle tone. Coordination normal.  Skin: Skin is warm and dry. No rash noted. She is not diaphoretic. No erythema. No pallor.  Psychiatric: She has a normal mood and affect. Her behavior is normal. Judgment and thought content normal.          Assessment & Plan:   No problem-specific assessment & plan notes found for this encounter.

## 2011-02-13 LAB — POCT I-STAT, CHEM 8
BUN: 12
Calcium, Ion: 1.1 — ABNORMAL LOW
Chloride: 99
Creatinine, Ser: 1
Glucose, Bld: 116 — ABNORMAL HIGH
HCT: 44
Hemoglobin: 15
Potassium: 3.9
Sodium: 134 — ABNORMAL LOW
TCO2: 26

## 2011-02-27 LAB — POCT URINALYSIS DIP (DEVICE)
Glucose, UA: NEGATIVE
Protein, ur: 100 — AB
Specific Gravity, Urine: 1.025
Urobilinogen, UA: 1
pH: 6

## 2011-06-15 ENCOUNTER — Encounter: Payer: Self-pay | Admitting: Internal Medicine

## 2011-06-15 ENCOUNTER — Other Ambulatory Visit (INDEPENDENT_AMBULATORY_CARE_PROVIDER_SITE_OTHER): Payer: 59

## 2011-06-15 ENCOUNTER — Ambulatory Visit (INDEPENDENT_AMBULATORY_CARE_PROVIDER_SITE_OTHER): Payer: 59 | Admitting: Internal Medicine

## 2011-06-15 DIAGNOSIS — E7800 Pure hypercholesterolemia, unspecified: Secondary | ICD-10-CM

## 2011-06-15 DIAGNOSIS — I10 Essential (primary) hypertension: Secondary | ICD-10-CM

## 2011-06-15 LAB — CBC WITH DIFFERENTIAL/PLATELET
Eosinophils Relative: 1.5 % (ref 0.0–5.0)
HCT: 36.5 % (ref 36.0–46.0)
Lymphs Abs: 3.3 10*3/uL (ref 0.7–4.0)
Monocytes Relative: 9.4 % (ref 3.0–12.0)
Platelets: 268 10*3/uL (ref 150.0–400.0)
RBC: 4.27 Mil/uL (ref 3.87–5.11)
WBC: 9.1 10*3/uL (ref 4.5–10.5)

## 2011-06-15 MED ORDER — PHENTERMINE HCL 37.5 MG PO TABS: 37.5000 mg | ORAL_TABLET | Freq: Every day | ORAL | Status: AC

## 2011-06-15 NOTE — Progress Notes (Signed)
  Subjective:    Patient ID: Janice Huang, female    DOB: 1971/04/16, 41 y.o.   MRN: [id number]  HPI  She comes in today and requests a med to help her with weight loss. She has tried diet and exercise.  Review of Systems  Constitutional: Positive for unexpected weight change. Negative for fever, chills, diaphoresis, activity change, appetite change and fatigue.  HENT: Negative.   Eyes: Negative.   Respiratory: Negative for apnea, cough, choking, chest tightness, shortness of breath, wheezing and stridor.   Cardiovascular: Negative for chest pain, palpitations and leg swelling.  Gastrointestinal: Negative.   Genitourinary: Negative.   Musculoskeletal: Negative.   Skin: Negative.   Neurological: Negative.   Hematological: Negative for adenopathy. Does not bruise/bleed easily.  Psychiatric/Behavioral: Negative for suicidal ideas, hallucinations, behavioral problems, confusion, sleep disturbance, self-injury, dysphoric mood, decreased concentration and agitation. The patient is not nervous/anxious and is not hyperactive.        Objective:   Physical Exam  Vitals reviewed. Constitutional: She is oriented to person, place, and time. She appears well-developed and well-nourished. No distress.  HENT:  Head: Normocephalic and atraumatic.  Mouth/Throat: Oropharynx is clear and moist. No oropharyngeal exudate.  Eyes: Conjunctivae are normal. Right eye exhibits no discharge. Left eye exhibits no discharge. No scleral icterus.  Neck: Normal range of motion. Neck supple. No JVD present. No tracheal deviation present. No thyromegaly present.  Cardiovascular: Normal rate, regular rhythm, normal heart sounds and intact distal pulses.  Exam reveals no gallop and no friction rub.   No murmur heard. Pulmonary/Chest: Effort normal and breath sounds normal. No stridor. No respiratory distress. She has no wheezes. She has no rales. She exhibits no tenderness.  Abdominal: Soft. Bowel sounds are  normal. She exhibits no distension and no mass. There is no tenderness. There is no rebound and no guarding.  Musculoskeletal: Normal range of motion. She exhibits no edema and no tenderness.  Lymphadenopathy:    She has no cervical adenopathy.  Neurological: She is oriented to person, place, and time.  Skin: Skin is warm and dry. No rash noted. She is not diaphoretic. No erythema. No pallor.  Psychiatric: She has a normal mood and affect. Her behavior is normal. Judgment and thought content normal.      Lab Results  Component Value Date   WBC 9.1 06/15/2011   HGB 12.5 06/15/2011   HCT 36.5 06/15/2011   PLT 268.0 06/15/2011   GLUCOSE 99 06/15/2011   CHOL 239* 06/15/2011   TRIG 108.0 06/15/2011   HDL 47.40 06/15/2011   LDLDIRECT 186.9 06/15/2011   ALT 19 06/15/2011   AST 20 06/15/2011   NA 137 06/15/2011   K 3.9 06/15/2011   CL 101 06/15/2011   CREATININE 0.8 06/15/2011   BUN 14 06/15/2011   CO2 29 06/15/2011   TSH 1.18 06/15/2011      Assessment & Plan:

## 2011-06-15 NOTE — Patient Instructions (Signed)
Hypertension As your heart beats, it forces blood through your arteries. This force is your blood pressure. If the pressure is too high, it is called hypertension (HTN) or high blood pressure. HTN is dangerous because you may have it and not know it. High blood pressure may mean that your heart has to work harder to pump blood. Your arteries may be narrow or stiff. The extra work puts you at risk for heart disease, stroke, and other problems.  Blood pressure consists of two numbers, a higher number over a lower, 110/72, for example. It is stated as "110 over 72." The ideal is below 120 for the top number (systolic) and under 80 for the bottom (diastolic). Write down your blood pressure today. You should pay close attention to your blood pressure if you have certain conditions such as:  Heart failure.   Prior heart attack.   Diabetes   Chronic kidney disease.   Prior stroke.   Multiple risk factors for heart disease.  To see if you have HTN, your blood pressure should be measured while you are seated with your arm held at the level of the heart. It should be measured at least twice. A one-time elevated blood pressure reading (especially in the Emergency Department) does not mean that you need treatment. There may be conditions in which the blood pressure is different between your right and left arms. It is important to see your caregiver soon for a recheck. Most people have essential hypertension which means that there is not a specific cause. This type of high blood pressure may be lowered by changing lifestyle factors such as:  Stress.   Smoking.   Lack of exercise.   Excessive weight.   Drug/tobacco/alcohol use.   Eating less salt.  Most people do not have symptoms from high blood pressure until it has caused damage to the body. Effective treatment can often prevent, delay or reduce that damage. TREATMENT  When a cause has been identified, treatment for high blood pressure is  directed at the cause. There are a large number of medications to treat HTN. These fall into several categories, and your caregiver will help you select the medicines that are best for you. Medications may have side effects. You should review side effects with your caregiver. If your blood pressure stays high after you have made lifestyle changes or started on medicines,   Your medication(s) may need to be changed.   Other problems may need to be addressed.   Be certain you understand your prescriptions, and know how and when to take your medicine.   Be sure to follow up with your caregiver within the time frame advised (usually within two weeks) to have your blood pressure rechecked and to review your medications.   If you are taking more than one medicine to lower your blood pressure, make sure you know how and at what times they should be taken. Taking two medicines at the same time can result in blood pressure that is too low.  SEEK IMMEDIATE MEDICAL CARE IF:  You develop a severe headache, blurred or changing vision, or confusion.   You have unusual weakness or numbness, or a faint feeling.   You have severe chest or abdominal pain, vomiting, or breathing problems.  MAKE SURE YOU:   Understand these instructions.   Will watch your condition.   Will get help right away if you are not doing well or get worse.  Document Released: 05/08/2005 Document Revised: 01/18/2011 Document Reviewed:   12/27/2007 [employer] Patient Information 2012 [place], [place].Obesity Obesity is defined as having a body mass index (BMI) of 30 or more. To calculate your BMI divide your weight in pounds by your height in inches squared and multiply that product by 703. Major illnesses resulting from long-term obesity include:  Stroke.   Heart disease.   Diabetes.   Many cancers.   Arthritis.  Obesity also complicates recovery from many other medical problems.  CAUSES   A history of obesity in your  parents.   Thyroid hormone imbalance.   Environmental factors such as excess calorie intake and physical inactivity.  TREATMENT  A healthy weight loss program includes:  A calorie restricted diet based on individual calorie needs.   Increased physical activity (exercise).  An exercise program is just as important as the right low-calorie diet.  Weight-loss medicines should be used only under the supervision of your physician. These medicines help, but only if they are used with diet and exercise programs. Medicines can have side effects including nervousness, nausea, abdominal pain, diarrhea, headache, drowsiness, and depression.  An unhealthy weight loss program includes:  Fasting.   Fad diets.   Supplements and drugs.  These choices do not succeed in long-term weight control.  HOME CARE INSTRUCTIONS  To help you make the needed dietary changes:   Exercise and perform physical activity as directed by your caregiver.   Keep a daily record of everything you eat. There are many free websites to help you with this. It may be helpful to measure your foods so you can determine if you are eating the correct portion sizes.   Use low-calorie cookbooks or take special cooking classes.   Avoid alcohol. Drink more water and drinks with no calories.   Take vitamins and supplements only as recommended by your caregiver.   Weight loss support groups, Registered Dieticians, counselors, and stress reduction education can also be very helpful.  Document Released: 06/15/2004 Document Revised: 01/18/2011 Document Reviewed: 04/14/2007 [facility] Patient Information 2012 [place], [place].

## 2011-06-16 LAB — COMPREHENSIVE METABOLIC PANEL
Albumin: 3.8 g/dL (ref 3.5–5.2)
CO2: 29 mEq/L (ref 19–32)
GFR: 103.25 mL/min (ref >60.00)
Glucose, Bld: 99 mg/dL (ref 70–99)
Potassium: 3.9 mEq/L (ref 3.5–5.1)
Sodium: 137 mEq/L (ref 135–145)
Total Bilirubin: 0.5 mg/dL (ref 0.3–1.2)
Total Protein: 7.2 g/dL (ref 6.0–8.3)

## 2011-06-16 LAB — TSH: TSH: 1.18 u[IU]/mL (ref 0.35–5.50)

## 2011-06-16 LAB — LIPID PANEL
Total CHOL/HDL Ratio: 5
Triglycerides: 108 mg/dL (ref 0.0–149.0)

## 2011-06-17 ENCOUNTER — Encounter: Payer: Self-pay | Admitting: Internal Medicine

## 2011-06-17 NOTE — Assessment & Plan Note (Signed)
Her BP is well controlled, I will check her lytes and renal function 

## 2011-06-17 NOTE — Assessment & Plan Note (Signed)
She will try adipex in addition to lifestyle measures

## 2011-06-17 NOTE — Assessment & Plan Note (Signed)
I will check her FLP today 

## 2011-08-16 ENCOUNTER — Ambulatory Visit (INDEPENDENT_AMBULATORY_CARE_PROVIDER_SITE_OTHER): Payer: 59 | Admitting: Internal Medicine

## 2011-08-16 ENCOUNTER — Encounter: Payer: Self-pay | Admitting: Internal Medicine

## 2011-08-16 DIAGNOSIS — I10 Essential (primary) hypertension: Secondary | ICD-10-CM

## 2011-08-16 MED ORDER — PHENTERMINE HCL 37.5 MG PO TABS: 37.5000 mg | ORAL_TABLET | Freq: Every day | ORAL | Status: DC

## 2011-08-16 NOTE — Patient Instructions (Signed)
Obesity Obesity is defined as having a body mass index (BMI) of 30 or more. To calculate your BMI divide your weight in pounds by your height in inches squared and multiply that product by 703. Major illnesses resulting from long-term obesity include:  Stroke.   Heart disease.   Diabetes.   Many cancers.   Arthritis.  Obesity also complicates recovery from many other medical problems.  CAUSES   A history of obesity in your parents.   Thyroid hormone imbalance.   Environmental factors such as excess calorie intake and physical inactivity.  TREATMENT  A healthy weight loss program includes:  A calorie restricted diet based on individual calorie needs.   Increased physical activity (exercise).  An exercise program is just as important as the right low-calorie diet.  Weight-loss medicines should be used only under the supervision of your physician. These medicines help, but only if they are used with diet and exercise programs. Medicines can have side effects including nervousness, nausea, abdominal pain, diarrhea, headache, drowsiness, and depression.  An unhealthy weight loss program includes:  Fasting.   Fad diets.   Supplements and drugs.  These choices do not succeed in long-term weight control.  HOME CARE INSTRUCTIONS  To help you make the needed dietary changes:   Exercise and perform physical activity as directed by your caregiver.   Keep a daily record of everything you eat. There are many free websites to help you with this. It may be helpful to measure your foods so you can determine if you are eating the correct portion sizes.   Use low-calorie cookbooks or take special cooking classes.   Avoid alcohol. Drink more water and drinks with no calories.   Take vitamins and supplements only as recommended by your caregiver.   Weight loss support groups, Registered Dieticians, counselors, and stress reduction education can also be very helpful.  Document Released:  06/15/2004 Document Revised: 04/27/2011 Document Reviewed: 04/14/2007 [employer] Patient Information 2012 [employer]. 

## 2011-08-16 NOTE — Assessment & Plan Note (Signed)
She has lost 8 pounds and is tolerating the phentermine well so will continue it for another 2-3 months

## 2011-08-16 NOTE — Assessment & Plan Note (Signed)
Her  BP remains well controlled

## 2011-08-16 NOTE — Progress Notes (Signed)
  Subjective:    Patient ID: Janice Huang, female    DOB: 1971/03/08, 41 y.o.   MRN: [id number]  HPI She returns for f/up and she tells me that she is doing well with her weight loss program.   Review of Systems  Constitutional: Negative.   HENT: Negative.   Eyes: Negative.   Respiratory: Negative for cough, chest tightness, shortness of breath, wheezing and stridor.   Cardiovascular: Negative for chest pain, palpitations and leg swelling.  Gastrointestinal: Negative for nausea, vomiting, abdominal pain, diarrhea, constipation and anal bleeding.  Genitourinary: Negative.   Musculoskeletal: Negative.   Skin: Negative.   Neurological: Negative.   Hematological: Negative for adenopathy. Does not bruise/bleed easily.  Psychiatric/Behavioral: Negative for behavioral problems, sleep disturbance and agitation. The patient is not nervous/anxious and is not hyperactive.        Objective:   Physical Exam  Vitals reviewed. Constitutional: She is oriented to person, place, and time. She appears well-developed and well-nourished. No distress.  HENT:  Head: Normocephalic and atraumatic.  Mouth/Throat: Oropharynx is clear and moist. No oropharyngeal exudate.  Eyes: Conjunctivae are normal. Right eye exhibits no discharge. Left eye exhibits no discharge. No scleral icterus.  Neck: Normal range of motion. Neck supple. No JVD present. No tracheal deviation present. No thyromegaly present.  Cardiovascular: Normal rate, regular rhythm, normal heart sounds and intact distal pulses.  Exam reveals no gallop and no friction rub.   No murmur heard. Pulmonary/Chest: Effort normal and breath sounds normal. No stridor. No respiratory distress. She has no wheezes. She has no rales. She exhibits no tenderness.  Abdominal: Soft. Bowel sounds are normal. She exhibits no distension and no mass. There is no tenderness. There is no rebound and no guarding.  Musculoskeletal: Normal range of motion. She  exhibits no edema and no tenderness.  Lymphadenopathy:    She has no cervical adenopathy.  Neurological: She is oriented to person, place, and time.  Skin: Skin is warm and dry. No rash noted. She is not diaphoretic. No erythema. No pallor.  Psychiatric: She has a normal mood and affect. Her behavior is normal. Judgment and thought content normal.     Lab Results  Component Value Date   WBC 9.1 06/15/2011   HGB 12.5 06/15/2011   HCT 36.5 06/15/2011   PLT 268.0 06/15/2011   GLUCOSE 99 06/15/2011   CHOL 239* 06/15/2011   TRIG 108.0 06/15/2011   HDL 47.40 06/15/2011   LDLDIRECT 186.9 06/15/2011   ALT 19 06/15/2011   AST 20 06/15/2011   NA 137 06/15/2011   K 3.9 06/15/2011   CL 101 06/15/2011   CREATININE 0.8 06/15/2011   BUN 14 06/15/2011   CO2 29 06/15/2011   TSH 1.18 06/15/2011       Assessment & Plan:

## 2011-12-21 ENCOUNTER — Ambulatory Visit: Payer: 59 | Admitting: Internal Medicine

## 2011-12-26 ENCOUNTER — Other Ambulatory Visit: Payer: Self-pay | Admitting: Internal Medicine

## 2012-01-04 ENCOUNTER — Encounter: Payer: Self-pay | Admitting: Internal Medicine

## 2012-01-04 ENCOUNTER — Other Ambulatory Visit (INDEPENDENT_AMBULATORY_CARE_PROVIDER_SITE_OTHER): Payer: 59

## 2012-01-04 ENCOUNTER — Ambulatory Visit (INDEPENDENT_AMBULATORY_CARE_PROVIDER_SITE_OTHER): Payer: 59 | Admitting: Internal Medicine

## 2012-01-04 VITALS — BP 120/78 | HR 76 | Temp 98.7°F | Resp 16 | Wt 326.5 lb

## 2012-01-04 DIAGNOSIS — I10 Essential (primary) hypertension: Secondary | ICD-10-CM

## 2012-01-04 DIAGNOSIS — E7800 Pure hypercholesterolemia, unspecified: Secondary | ICD-10-CM

## 2012-01-04 LAB — BASIC METABOLIC PANEL
BUN: 12 mg/dL (ref 6–23)
Creatinine, Ser: 0.7 mg/dL (ref 0.4–1.2)
GFR: 111.04 mL/min (ref >60.00)

## 2012-01-04 MED ORDER — ATORVASTATIN CALCIUM 40 MG PO TABS: 40.0000 mg | ORAL_TABLET | Freq: Every day | ORAL | Status: DC

## 2012-01-04 NOTE — Progress Notes (Signed)
  Subjective:    Patient ID: Janice Huang, female    DOB: 09-05-1970, 41 y.o.   MRN: [id number]  Hypertension This is a chronic problem. The current episode started more than 1 year ago. The problem has been gradually improving since onset. The problem is controlled. Pertinent negatives include no anxiety, blurred vision, chest pain, headaches, malaise/fatigue, neck pain, orthopnea, palpitations, peripheral edema, PND, shortness of breath or sweats. Past treatments include ACE inhibitors and diuretics. The current treatment provides significant improvement. Compliance problems include exercise and diet.       Review of Systems  Constitutional: Negative for fever, chills, malaise/fatigue, diaphoresis, activity change, appetite change, fatigue and unexpected weight change.  HENT: Negative.  Negative for neck pain.   Eyes: Negative.  Negative for blurred vision.  Respiratory: Negative for cough, chest tightness, shortness of breath, wheezing and stridor.   Cardiovascular: Negative for chest pain, palpitations, orthopnea, leg swelling and PND.  Gastrointestinal: Negative for nausea, vomiting, abdominal pain, diarrhea, constipation and anal bleeding.  Genitourinary: Negative.   Musculoskeletal: Negative for myalgias, back pain, joint swelling, arthralgias and gait problem.  Skin: Negative.   Neurological: Negative for dizziness, tremors, seizures, syncope, facial asymmetry, speech difficulty, weakness, light-headedness, numbness and headaches.  Hematological: Negative for adenopathy. Does not bruise/bleed easily.  Psychiatric/Behavioral: Negative.        Objective:   Physical Exam  Vitals reviewed. Constitutional: She is oriented to person, place, and time. She appears well-developed and well-nourished. No distress.  HENT:  Head: Normocephalic and atraumatic.  Mouth/Throat: Oropharynx is clear and moist. No oropharyngeal exudate.  Eyes: Right eye exhibits no discharge. Left eye  exhibits no discharge. No scleral icterus.  Neck: Normal range of motion. Neck supple. No JVD present. No tracheal deviation present. No thyromegaly present.  Cardiovascular: Normal rate, regular rhythm and intact distal pulses.  Exam reveals no gallop and no friction rub.   No murmur heard. Pulmonary/Chest: Effort normal and breath sounds normal. No stridor. No respiratory distress. She has no wheezes. She has no rales. She exhibits no tenderness.  Abdominal: Soft. Bowel sounds are normal. She exhibits no distension and no mass. There is no tenderness. There is no rebound and no guarding.  Musculoskeletal: Normal range of motion. She exhibits no edema and no tenderness.  Lymphadenopathy:    She has no cervical adenopathy.  Neurological: She is oriented to person, place, and time.  Skin: Skin is warm and dry. No rash noted. She is not diaphoretic. No erythema. No pallor.  Psychiatric: She has a normal mood and affect. Her behavior is normal. Judgment and thought content normal.      Lab Results  Component Value Date   WBC 9.1 06/15/2011   HGB 12.5 06/15/2011   HCT 36.5 06/15/2011   PLT 268.0 06/15/2011   GLUCOSE 99 06/15/2011   CHOL 239* 06/15/2011   TRIG 108.0 06/15/2011   HDL 47.40 06/15/2011   LDLDIRECT 186.9 06/15/2011   ALT 19 06/15/2011   AST 20 06/15/2011   NA 137 06/15/2011   K 3.9 06/15/2011   CL 101 06/15/2011   CREATININE 0.8 06/15/2011   BUN 14 06/15/2011   CO2 29 06/15/2011   TSH 1.18 06/15/2011      Assessment & Plan:

## 2012-01-04 NOTE — Patient Instructions (Signed)

## 2012-01-05 ENCOUNTER — Encounter: Payer: Self-pay | Admitting: Internal Medicine

## 2012-01-05 MED ORDER — LORCASERIN HCL 10 MG PO TABS: 1.0000 | ORAL_TABLET | Freq: Two times a day (BID) | ORAL | Status: DC

## 2012-01-05 NOTE — Assessment & Plan Note (Signed)
I offered her an Rx for belviq for weight management

## 2012-01-05 NOTE — Assessment & Plan Note (Signed)
Her BP is well controlled, I will check her lytes and renal function 

## 2012-01-05 NOTE — Assessment & Plan Note (Signed)
She has developed several risk factors for CAD and CVA so I asked her to start lipitor

## 2012-01-12 ENCOUNTER — Emergency Department (HOSPITAL_COMMUNITY)
Admission: EM | Admit: 2012-01-12 | Discharge: 2012-01-12 | Disposition: A | Discharge status: Home / Self Care | Payer: 59 | Attending: *Deleted | Admitting: *Deleted

## 2012-01-12 ENCOUNTER — Emergency Department (HOSPITAL_COMMUNITY): Payer: 59

## 2012-01-12 ENCOUNTER — Encounter (HOSPITAL_COMMUNITY): Payer: Self-pay | Admitting: *Deleted

## 2012-01-12 DIAGNOSIS — M79672 Pain in left foot: Secondary | ICD-10-CM

## 2012-01-12 DIAGNOSIS — I10 Essential (primary) hypertension: Secondary | ICD-10-CM | POA: Insufficient documentation

## 2012-01-12 DIAGNOSIS — E785 Hyperlipidemia, unspecified: Secondary | ICD-10-CM | POA: Insufficient documentation

## 2012-01-12 DIAGNOSIS — Z79899 Other long term (current) drug therapy: Secondary | ICD-10-CM | POA: Insufficient documentation

## 2012-01-12 DIAGNOSIS — M79609 Pain in unspecified limb: Secondary | ICD-10-CM | POA: Insufficient documentation

## 2012-01-12 MED ORDER — OXYCODONE-ACETAMINOPHEN 5-325 MG PO TABS
2.0000 | ORAL_TABLET | Freq: Once | ORAL | Status: AC
  Administered 2012-01-12: 2 via ORAL
  Filled 2012-01-12: qty 2

## 2012-01-12 MED ORDER — OXYCODONE-ACETAMINOPHEN 5-325 MG PO TABS: 2.0000 | ORAL_TABLET | ORAL | Status: DC | PRN

## 2012-01-12 MED ORDER — DIPHENHYDRAMINE HCL 25 MG PO CAPS
25.0000 mg | ORAL_CAPSULE | Freq: Once | ORAL | Status: AC
  Administered 2012-01-12: 25 mg via ORAL
  Filled 2012-01-12: qty 1

## 2012-01-12 MED ORDER — MELOXICAM 15 MG PO TABS: 15.0000 mg | ORAL_TABLET | Freq: Every day | ORAL | Status: DC

## 2012-01-12 MED ORDER — ONDANSETRON 8 MG PO TBDP
8.0000 mg | ORAL_TABLET | Freq: Once | ORAL | Status: DC
  Filled 2012-01-12: qty 1

## 2012-01-12 NOTE — ED Notes (Signed)
Pt reports stepping out of her car then had a sudden pain to her left foot. Foot is swollen and tender

## 2012-01-13 NOTE — ED Provider Notes (Signed)
History     CSN: [id number]  Arrival date & time 01/12/12  1727   First MD Initiated Contact with Patient 01/12/12 1911      Chief Complaint  Patient presents with  . Foot Pain    left    (Consider location/radiation/quality/duration/timing/severity/associated sxs/prior treatment) HPI Comments: Janice Huang 41 y.o. female   The chief complaint is: Patient presents with:   Foot Pain - left    Patient reports that yesterday she stepped out of the car onto her left foot and felt a sudden sharp pain. She denies rolling her ankle or otherwise injuring it. She states that her foot became more painful and swollen throughout the day. She claims that her foot is most painful when she applies weight.  She states that she cannot ambulate due to pain and needs this issue resolved tonight because she has to work this weekend.  She denies any bruising, or paresthesias. Denies fever, nausea, vomiting, diarrhea, chest pain, sob, chills.   Patient is a 41 y.o. female presenting with lower extremity pain. The history is provided by the patient. No language interpreter was used.  Foot Pain Associated symptoms include joint swelling. Pertinent negatives include no abdominal pain, chest pain, chills, fever, nausea, numbness or vomiting.    Past Medical History  Diagnosis Date  . Hypertension   . Hyperlipidemia     Past Surgical History  Procedure Date  . Abdominal hysterectomy     1 ovary removed    Family History  Problem Relation Age of Onset  . Hypertension Other   . Diabetes Other     type 2    History  Substance Use Topics  . Smoking status: Never Smoker   . Smokeless tobacco: Not on file  . Alcohol Use: No    OB History    Grav Para Term Preterm Abortions TAB SAB Ect Mult Living                  Review of Systems  Constitutional: Negative for fever and chills.  Respiratory: Negative for shortness of breath.   Cardiovascular: Negative for chest pain.    Gastrointestinal: Negative for nausea, vomiting, abdominal pain and diarrhea.  Musculoskeletal: Positive for joint swelling and gait problem.  Skin: Negative for color change.  Neurological: Negative for numbness.    Allergies  Codeine  Home Medications   Current Outpatient Rx  Name Route Sig Dispense Refill  . ACETAMINOPHEN 325 MG PO TABS Oral Take 650 mg by mouth every 6 (six) hours as needed. Pain    . ATORVASTATIN CALCIUM 40 MG PO TABS Oral Take 1 tablet (40 mg total) by mouth daily. 90 tablet 3  . LISINOPRIL-HYDROCHLOROTHIAZIDE 20-25 MG PO TABS Oral Take 1 tablet by mouth daily.    . LORCASERIN HCL 10 MG PO TABS Oral Take 1 capsule by mouth 2 (two) times daily. 60 tablet 5  . MELOXICAM 15 MG PO TABS Oral Take 1 tablet (15 mg total) by mouth daily. 10 tablet 0  . OXYCODONE-ACETAMINOPHEN 5-325 MG PO TABS Oral Take 2 tablets by mouth every 4 (four) hours as needed for pain. 6 tablet 0    BP 130/69  Pulse 76  Temp 98.8 F (37.1 C) (Oral)  Resp 20  SpO2 100%  Physical Exam  Nursing note and vitals reviewed. Constitutional: She appears well-developed and well-nourished. No distress.       Morbidly obese female.  HENT:  Head: Normocephalic and atraumatic.  Eyes: Conjunctivae are  normal.  Neck: Normal range of motion.  Cardiovascular: Normal rate, regular rhythm and normal heart sounds.   Pulmonary/Chest: Effort normal and breath sounds normal.  Abdominal: Soft. There is no tenderness.  Musculoskeletal:       Left foot with mild edema. FROM of ankle and toes.  NTTP.  NV intact. No ecchymosis or deformities.   Skin: She is not diaphoretic.    ED Course  Procedures (including critical care time)  Labs Reviewed - No data to display Dg Foot Complete Left  01/12/2012  *RADIOLOGY REPORT*  Clinical Data: Foot pain  LEFT FOOT - COMPLETE 3+ VIEW  Comparison: None.  Findings: No fracture or dislocation is noted.  Joint spaces are intact.  Mild spurring of posterior calcaneus is  noted.  No definite soft tissue abnormality seen.  IMPRESSION: No fracture or dislocation seen.   Original Report Authenticated By: Venita Sheffield., M.D.    Patient Xrays are negative for acute fracture.  The expresses her frustration with the fact that we can do little to solve her inability to apply pressure tonight here at the ED.  I spoke with her about Orthopedic follow up and possible getting  A CAM walker boot, but that was not something we had available here in the ED.  I told the patient that she might could certainly have an aso ankle brace and crutches which may help her to get through the weekend.  Patient expresses understanding. I will give her those items.  I have offered her mobic and a very short course of narcotic pain relief for sleeping, as patient is complaining of her continued discomfort. All questions answered fully, patient expresses understanding and agrees with plan. She is safe for discharge and will follow up with ortho.   1. Foot pain, left       MDM  Discussed reasons to seek immediate care. Patient expresses understanding and agrees with plan.         Arthor Captain, PA-C 01/13/12 1109

## 2012-01-13 NOTE — ED Provider Notes (Signed)
Medical screening examination/treatment/procedure(s) were performed by non-physician practitioner and as supervising physician I was immediately available for consultation/collaboration.   Lyanne Co, MD 01/13/12 [id number]

## 2012-04-17 ENCOUNTER — Ambulatory Visit (INDEPENDENT_AMBULATORY_CARE_PROVIDER_SITE_OTHER): Payer: Self-pay | Admitting: Internal Medicine

## 2012-04-17 ENCOUNTER — Encounter: Payer: Self-pay | Admitting: Internal Medicine

## 2012-04-17 VITALS — BP 114/64 | HR 75 | Temp 98.0°F | Resp 18 | Ht 67.5 in | Wt 327.2 lb

## 2012-04-17 DIAGNOSIS — M791 Myalgia, unspecified site: Secondary | ICD-10-CM

## 2012-04-17 DIAGNOSIS — IMO0001 Reserved for inherently not codable concepts without codable children: Secondary | ICD-10-CM

## 2012-04-17 DIAGNOSIS — T148XXA Other injury of unspecified body region, initial encounter: Secondary | ICD-10-CM

## 2012-04-17 MED ORDER — DICLOFENAC SODIUM 75 MG PO TBEC: 75.0000 mg | DELAYED_RELEASE_TABLET | Freq: Two times a day (BID) | ORAL | Status: DC

## 2012-04-17 MED ORDER — CYCLOBENZAPRINE HCL 10 MG PO TABS: 10.0000 mg | ORAL_TABLET | Freq: Three times a day (TID) | ORAL | Status: DC | PRN
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
## 2012-04-17 NOTE — Patient Instructions (Addendum)
Back Exercises These exercises may help you when beginning to rehabilitate your injury. Your symptoms may resolve with or without further involvement from your physician, physical therapist or athletic trainer. While completing these exercises, remember:    Restoring tissue flexibility helps normal motion to return to the joints. This allows healthier, less painful movement and activity.   An effective stretch should be held for at least 30 seconds.   A stretch should never be painful. You should only feel a gentle lengthening or release in the stretched tissue.  STRETCH  Extension, Prone on Elbows   Lie on your stomach on the floor, a bed will be too soft. Place your palms about shoulder width apart and at the height of your head.   Place your elbows under your shoulders. If this is too painful, stack pillows under your chest.   Allow your body to relax so that your hips drop lower and make contact more completely with the floor.   Hold this position for __________ seconds.   Slowly return to lying flat on the floor.  Repeat __________ times. Complete this exercise __________ times per day.   RANGE OF MOTION  Extension, Prone Press Ups   Lie on your stomach on the floor, a bed will be too soft. Place your palms about shoulder width apart and at the height of your head.   Keeping your back as relaxed as possible, slowly straighten your elbows while keeping your hips on the floor. You may adjust the placement of your hands to maximize your comfort. As you gain motion, your hands will come more underneath your shoulders.   Hold this position __________ seconds.   Slowly return to lying flat on the floor.  Repeat __________ times. Complete this exercise __________ times per day.   RANGE OF MOTION- Quadruped, Neutral Spine   Assume a hands and knees position on a firm surface. Keep your hands under your shoulders and your knees under your hips. You may place padding under your knees for  comfort.   Drop your head and point your tail bone toward the ground below you. This will round out your low back like an angry cat. Hold this position for __________ seconds.   Slowly lift your head and release your tail bone so that your back sags into a large arch, like an old horse.   Hold this position for __________ seconds.   Repeat this until you feel limber in your low back.   Now, find your "sweet spot." This will be the most comfortable position somewhere between the two previous positions. This is your neutral spine. Once you have found this position, tense your stomach muscles to support your low back.   Hold this position for __________ seconds.  Repeat __________ times. Complete this exercise __________ times per day.   STRETCH  Flexion, Single Knee to Chest   Lie on a firm bed or floor with both legs extended in front of you.   Keeping one leg in contact with the floor, bring your opposite knee to your chest. Hold your leg in place by either grabbing behind your thigh or at your knee.   Pull until you feel a gentle stretch in your low back. Hold __________ seconds.   Slowly release your grasp and repeat the exercise with the opposite side.  Repeat __________ times. Complete this exercise __________ times per day.   STRETCH - Hamstrings, Standing  Stand or sit and extend your right / left leg, placing  your foot on a chair or foot stool   Keeping a slight arch in your low back and your hips straight forward.   Lead with your chest and lean forward at the waist until you feel a gentle stretch in the back of your right / left knee or thigh. (When done correctly, this exercise requires leaning only a small distance.)   Hold this position for __________ seconds.  Repeat __________ times. Complete this stretch __________ times per day. STRENGTHENING  Deep Abdominals, Pelvic Tilt   Lie on a firm bed or floor. Keeping your legs in front of you, bend your knees so they are  both pointed toward the ceiling and your feet are flat on the floor.   Tense your lower abdominal muscles to press your low back into the floor. This motion will rotate your pelvis so that your tail bone is scooping upwards rather than pointing at your feet or into the floor.   With a gentle tension and even breathing, hold this position for __________ seconds.  Repeat __________ times. Complete this exercise __________ times per day.   STRENGTHENING  Abdominals, Crunches   Lie on a firm bed or floor. Keeping your legs in front of you, bend your knees so they are both pointed toward the ceiling and your feet are flat on the floor. Cross your arms over your chest.   Slightly tip your chin down without bending your neck.   Tense your abdominals and slowly lift your trunk high enough to just clear your shoulder blades. Lifting higher can put excessive stress on the low back and does not further strengthen your abdominal muscles.   Control your return to the starting position.  Repeat __________ times. Complete this exercise __________ times per day.   STRENGTHENING  Quadruped, Opposite UE/LE Lift   Assume a hands and knees position on a firm surface. Keep your hands under your shoulders and your knees under your hips. You may place padding under your knees for comfort.   Find your neutral spine and gently tense your abdominal muscles so that you can maintain this position. Your shoulders and hips should form a rectangle that is parallel with the floor and is not twisted.   Keeping your trunk steady, lift your right hand no higher than your shoulder and then your left leg no higher than your hip. Make sure you are not holding your breath. Hold this position __________ seconds.   Continuing to keep your abdominal muscles tense and your back steady, slowly return to your starting position. Repeat with the opposite arm and leg.  Repeat __________ times. Complete this exercise __________ times per  day. Document Released: 05/26/2005 Document Revised: 07/31/2011 Document Reviewed: 08/20/2008 [facility] Patient Information 2013 [place], [place].   RICE: Routine Care for Injuries The routine care of many injuries includes Rest, Ice, Compression, and Elevation (RICE). HOME CARE INSTRUCTIONS  Rest is needed to allow your body to heal. Routine activities can usually be resumed when comfortable. Injured tendons and bones can take up to 6 weeks to heal. Tendons are the cord-like structures that attach muscle to bone.   Ice following an injury helps keep the swelling down and reduces pain.   Put ice in a plastic bag.   Place a towel between your skin and the bag.   Leave the ice on for 15 to 20 minutes, 3 to 4 times a day. Do this while awake, for the first 24 to 48 hours. After that, continue as  directed by your caregiver.   Compression helps keep swelling down. It also gives support and helps with discomfort. If an elastic bandage has been applied, it should be removed and reapplied every 3 to 4 hours. It should not be applied tightly, but firmly enough to keep swelling down. Watch fingers or toes for swelling, bluish discoloration, coldness, numbness, or excessive pain. If any of these problems occur, remove the bandage and reapply loosely. Contact your caregiver if these problems continue.   Elevation helps reduce swelling and decreases pain. With extremities, such as the arms, hands, legs, and feet, the injured area should be placed near or above the level of the heart, if possible.  SEEK IMMEDIATE MEDICAL CARE IF:  You have persistent pain and swelling.   You develop redness, numbness, or unexpected weakness.   Your symptoms are getting worse rather than improving after several days.  These symptoms may indicate that further evaluation or further X-rays are needed. Sometimes, X-rays may not show a small broken bone (fracture) until 1 week or 10 days later. Make a follow-up appointment with  your caregiver. Ask when your X-ray results will be ready. Make sure you get your X-ray results. Document Released: 08/20/2000 Document Revised: 07/31/2011 Document Reviewed: 10/07/2010 [facility] Patient Information 2013 [place], [place].

## 2012-04-26 ENCOUNTER — Telehealth: Payer: Self-pay | Admitting: Internal Medicine
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
## 2012-04-29 ENCOUNTER — Ambulatory Visit (INDEPENDENT_AMBULATORY_CARE_PROVIDER_SITE_OTHER): Payer: Self-pay | Admitting: Internal Medicine

## 2012-04-29 ENCOUNTER — Encounter: Payer: Self-pay | Admitting: Internal Medicine

## 2012-04-29 VITALS — BP 122/72 | HR 81 | Temp 97.8°F | Resp 16 | Ht 67.5 in | Wt 329.0 lb

## 2012-04-29 DIAGNOSIS — S161XXA Strain of muscle, fascia and tendon at neck level, initial encounter: Secondary | ICD-10-CM | POA: Insufficient documentation

## 2012-04-29 DIAGNOSIS — S139XXA Sprain of joints and ligaments of unspecified parts of neck, initial encounter: Secondary | ICD-10-CM

## 2012-04-29 DIAGNOSIS — I10 Essential (primary) hypertension: Secondary | ICD-10-CM

## 2012-04-29 NOTE — Assessment & Plan Note (Signed)
Her BP is well controlled 

## 2012-04-29 NOTE — Patient Instructions (Signed)
Torticollis, Acute You have suddenly (acutely) developed a twisted neck (torticollis). This is usually a self-limited condition. CAUSES  Acute torticollis may be caused by malposition, trauma or infection. Most commonly, acute torticollis is caused by sleeping in an awkward position. Torticollis may also be caused by the flexion, extension or twisting of the neck muscles beyond their normal position. Sometimes, the exact cause may not be known. SYMPTOMS  Usually, there is pain and limited movement of the neck. Your neck may twist to one side. DIAGNOSIS  The diagnosis is often made by physical examination. X-rays, CT scans or MRIs may be done if there is a history of trauma or concern of infection. TREATMENT  For a common, stiff neck that develops during sleep, treatment is focused on relaxing the contracted neck muscle. Medications (including shots) may be used to treat the problem. Most cases resolve in several days. Torticollis usually responds to conservative physical therapy. If left untreated, the shortened and spastic neck muscle can cause deformities in the face and neck. Rarely, surgery is required. HOME CARE INSTRUCTIONS   Use over-the-counter and prescription medications as directed by your caregiver.  Do stretching exercises and massage the neck as directed by your caregiver.  Follow up with physical therapy if needed and as directed by your caregiver. SEEK IMMEDIATE MEDICAL CARE IF:   You develop difficulty breathing or noisy breathing (stridor).  You drool, develop trouble swallowing or have pain with swallowing.  You develop numbness or weakness in the hands or feet.  You have changes in speech or vision.  You have problems with urination or bowel movements.  You have difficulty walking.  You have a fever.  You have increased pain. MAKE SURE YOU:   Understand these instructions.  Will watch your condition.  Will get help right away if you are not doing well or  get worse. Document Released: 05/05/2000 Document Revised: 07/31/2011 Document Reviewed: 06/16/2009 [employer] Patient Information 2013 [employer].  
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
## 2012-05-06 ENCOUNTER — Encounter: Payer: Self-pay | Admitting: Internal Medicine

## 2012-05-06 ENCOUNTER — Ambulatory Visit (INDEPENDENT_AMBULATORY_CARE_PROVIDER_SITE_OTHER): Payer: 59 | Admitting: Internal Medicine

## 2012-05-06 ENCOUNTER — Other Ambulatory Visit (INDEPENDENT_AMBULATORY_CARE_PROVIDER_SITE_OTHER): Payer: Self-pay

## 2012-05-06 VITALS — BP 138/82 | HR 96 | Temp 98.6°F | Resp 16 | Wt 332.0 lb

## 2012-05-06 DIAGNOSIS — R7309 Other abnormal glucose: Secondary | ICD-10-CM

## 2012-05-06 DIAGNOSIS — I10 Essential (primary) hypertension: Secondary | ICD-10-CM

## 2012-05-06 DIAGNOSIS — R7303 Prediabetes: Secondary | ICD-10-CM | POA: Insufficient documentation

## 2012-05-06 DIAGNOSIS — E785 Hyperlipidemia, unspecified: Secondary | ICD-10-CM

## 2012-05-06 DIAGNOSIS — M109 Gout, unspecified: Secondary | ICD-10-CM

## 2012-05-06 LAB — COMPREHENSIVE METABOLIC PANEL
ALT: 14 U/L (ref 0–35)
AST: 18 U/L (ref 0–37)
CO2: 30 mEq/L (ref 19–32)
GFR: 105.89 mL/min (ref >60.00)
Sodium: 138 mEq/L (ref 135–145)
Total Bilirubin: 0.7 mg/dL (ref 0.3–1.2)
Total Protein: 7.9 g/dL (ref 6.0–8.3)

## 2012-05-06 MED ORDER — COLCHICINE 0.6 MG PO TABS: 0.6000 mg | ORAL_TABLET | Freq: Every day | ORAL | Status: DC

## 2012-05-06 MED ORDER — METHYLPREDNISOLONE ACETATE 80 MG/ML IJ SUSP
120.0000 mg | Freq: Once | INTRAMUSCULAR | Status: AC
  Administered 2012-05-06: 120 mg via INTRAMUSCULAR

## 2012-05-06 MED ORDER — OXYCODONE-ACETAMINOPHEN 5-325 MG PO TABS: 2.0000 | ORAL_TABLET | ORAL | Status: AC | PRN

## 2012-05-06 NOTE — Assessment & Plan Note (Signed)
Start colchicine and check renal function and uric acid level today

## 2012-05-06 NOTE — Assessment & Plan Note (Signed)
Her BP is well controlled I will check her lytes and renal function 

## 2012-05-06 NOTE — Assessment & Plan Note (Signed)
A1C today to see if she has developed DM II

## 2012-05-06 NOTE — Patient Instructions (Signed)
Gout Gout is an inflammatory condition (arthritis) caused by a buildup of uric acid crystals in the joints. Uric acid is a chemical that is normally present in the blood. Under some circumstances, uric acid can form into crystals in your joints. This causes joint redness, soreness, and swelling (inflammation). Repeat attacks are common. Over time, uric acid crystals can form into masses (tophi) near a joint, causing disfigurement. Gout is treatable and often preventable. CAUSES  The disease begins with elevated levels of uric acid in the blood. Uric acid is produced by your body when it breaks down a naturally found substance called purines. This also happens when you eat certain foods such as meats and fish. Causes of an elevated uric acid level include:  Being passed down from parent to child (heredity).  Diseases that cause increased uric acid production (obesity, psoriasis, some cancers).  Excessive alcohol use.  Diet, especially diets rich in meat and seafood.  Medicines, including certain cancer-fighting drugs (chemotherapy), diuretics, and aspirin.  Chronic kidney disease. The kidneys are no longer able to remove uric acid well.  Problems with metabolism. Conditions strongly associated with gout include:  Obesity.  High blood pressure.  High cholesterol.  Diabetes. Not everyone with elevated uric acid levels gets gout. It is not understood why some people get gout and others do not. Surgery, joint injury, and eating too much of certain foods are some of the factors that can lead to gout. SYMPTOMS   An attack of gout comes on quickly. It causes intense pain with redness, swelling, and warmth in a joint.  Fever can occur.  Often, only one joint is involved. Certain joints are more commonly involved:  Base of the big toe.  Knee.  Ankle.  Wrist.  Finger. Without treatment, an attack usually goes away in a few days to weeks. Between attacks, you usually will not have  symptoms, which is different from many other forms of arthritis. DIAGNOSIS  Your caregiver will suspect gout based on your symptoms and exam. Removal of fluid from the joint (arthrocentesis) is done to check for uric acid crystals. Your caregiver will give you a medicine that numbs the area (local anesthetic) and use a needle to remove joint fluid for exam. Gout is confirmed when uric acid crystals are seen in joint fluid, using a special microscope. Sometimes, blood, urine, and X-ray tests are also used. TREATMENT  There are 2 phases to gout treatment: treating the sudden onset (acute) attack and preventing attacks (prophylaxis). Treatment of an Acute Attack  Medicines are used. These include anti-inflammatory medicines or steroid medicines.  An injection of steroid medicine into the affected joint is sometimes necessary.  The painful joint is rested. Movement can worsen the arthritis.  You may use warm or cold treatments on painful joints, depending which works best for you.  Discuss the use of coffee, vitamin C, or cherries with your caregiver. These may be helpful treatment options. Treatment to Prevent Attacks After the acute attack subsides, your caregiver may advise prophylactic medicine. These medicines either help your kidneys eliminate uric acid from your body or decrease your uric acid production. You may need to stay on these medicines for a very long time. The early phase of treatment with prophylactic medicine can be associated with an increase in acute gout attacks. For this reason, during the first few months of treatment, your caregiver may also advise you to take medicines usually used for acute gout treatment. Be sure you understand your caregiver's directions.   You should also discuss dietary treatment with your caregiver. Certain foods such as meats and fish can increase uric acid levels. Other foods such as dairy can decrease levels. Your caregiver can give you a list of foods  to avoid. HOME CARE INSTRUCTIONS   Do not take aspirin to relieve pain. This raises uric acid levels.  Only take over-the-counter or prescription medicines for pain, discomfort, or fever as directed by your caregiver.  Rest the joint as much as possible. When in bed, keep sheets and blankets off painful areas.  Keep the affected joint raised (elevated).  Use crutches if the painful joint is in your leg.  Drink enough water and fluids to keep your urine clear or pale yellow. This helps your body get rid of uric acid. Do not drink alcoholic beverages. They slow the passage of uric acid.  Follow your caregiver's dietary instructions. Pay careful attention to the amount of protein you eat. Your daily diet should emphasize fruits, vegetables, whole grains, and fat-free or low-fat milk products.  Maintain a healthy body weight. SEEK MEDICAL CARE IF:   You have an oral temperature above 102 F (38.9 C).  You develop diarrhea, vomiting, or any side effects from medicines.  You do not feel better in 24 hours, or you are getting worse. SEEK IMMEDIATE MEDICAL CARE IF:   Your joint becomes suddenly more tender and you have:  Chills.  An oral temperature above 102 F (38.9 C), not controlled by medicine. MAKE SURE YOU:   Understand these instructions.  Will watch your condition.  Will get help right away if you are not doing well or get worse. Document Released: 05/05/2000 Document Revised: 07/31/2011 Document Reviewed: 08/16/2009 Exit[employer] Patient Information 2013 [employer].    

## 2012-05-06 NOTE — Progress Notes (Signed)
Subjective:    Patient ID: Janice Huang, female    DOB: 11/24/1970, 41 y.o.   MRN: [id number]  Arthritis Presents for follow-up visit. She complains of pain, joint swelling and joint warmth. She reports no stiffness. The symptoms have been worsening. Affected locations include the right foot. Her pain is at a severity of 2/10. Associated symptoms include pain at night and pain while resting. Pertinent negatives include no diarrhea, dry eyes, dry mouth, dysuria, fatigue, fever, rash, Raynaud's syndrome, uveitis or weight loss. Compliance with total regimen is 0-25%. Compliance with medications is 0-25%.      Review of Systems  Constitutional: Negative for fever, chills, weight loss, diaphoresis, activity change, appetite change, fatigue and unexpected weight change.  HENT: Negative for facial swelling, trouble swallowing, neck pain, neck stiffness and voice change.   Eyes: Negative.   Respiratory: Negative for cough, chest tightness, shortness of breath, wheezing and stridor.   Cardiovascular: Negative for chest pain, palpitations and leg swelling.  Gastrointestinal: Negative for nausea, vomiting, abdominal pain, diarrhea and constipation.  Genitourinary: Negative.  Negative for dysuria.  Musculoskeletal: Positive for joint swelling, arthralgias (right foot over the 1st MTP joint) and arthritis. Negative for myalgias, back pain, gait problem and stiffness.  Skin: Negative for color change, pallor, rash and wound.  Neurological: Negative for dizziness, tremors, seizures, syncope, facial asymmetry, speech difficulty, weakness, light-headedness, numbness and headaches.  Hematological: Negative for adenopathy. Does not bruise/bleed easily.  Psychiatric/Behavioral: Negative.        Objective:   Physical Exam  Vitals reviewed. Constitutional: She is oriented to person, place, and time. She appears well-developed and well-nourished. No distress.  HENT:  Head: Normocephalic and  atraumatic.  Mouth/Throat: Oropharynx is clear and moist. No oropharyngeal exudate.  Eyes: Conjunctivae normal are normal. Right eye exhibits no discharge. Left eye exhibits no discharge. No scleral icterus.  Neck: Normal range of motion. Neck supple. No JVD present. No tracheal deviation present. No thyromegaly present.  Cardiovascular: Normal rate, regular rhythm, normal heart sounds and intact distal pulses.  Exam reveals no gallop and no friction rub.   No murmur heard. Pulmonary/Chest: Effort normal and breath sounds normal. No stridor. No respiratory distress. She has no wheezes. She has no rales. She exhibits no tenderness.  Abdominal: Soft. Bowel sounds are normal. She exhibits no distension and no mass. There is no tenderness. There is no rebound and no guarding.  Musculoskeletal: Normal range of motion. She exhibits no edema and no tenderness.       Right foot: She exhibits bony tenderness and swelling. She exhibits normal range of motion, no tenderness, normal capillary refill, no crepitus, no deformity and no laceration.       Feet:  Lymphadenopathy:    She has no cervical adenopathy.  Neurological: She is oriented to person, place, and time.  Skin: Skin is warm and dry. No rash noted. She is not diaphoretic. No erythema. No pallor.  Psychiatric: She has a normal mood and affect. Her behavior is normal. Judgment and thought content normal.     Lab Results  Component Value Date   WBC 9.1 06/15/2011   HGB 12.5 06/15/2011   HCT 36.5 06/15/2011   PLT 268.0 06/15/2011   GLUCOSE 104* 01/04/2012   CHOL 239* 06/15/2011   TRIG 108.0 06/15/2011   HDL 47.40 06/15/2011   LDLDIRECT 186.9 06/15/2011   ALT 19 06/15/2011   AST 20 06/15/2011   NA 137 01/04/2012   K 3.9 01/04/2012   CL 100  01/04/2012   CREATININE 0.7 01/04/2012   BUN 12 01/04/2012   CO2 29 01/04/2012   TSH 1.18 06/15/2011       Assessment & Plan:

## 2012-05-06 NOTE — Assessment & Plan Note (Signed)
She tells me that she has a lot of pain and swelling so I gave her an injection of depo-medrol, she will start colchicine and will restart the nsaids which she stopped several days ago after the neck and low back pain resolved. She will try percocet for the severe pain that keeps her awake at night.

## 2012-05-08 ENCOUNTER — Encounter: Payer: Self-pay | Admitting: Internal Medicine

## 2012-05-30 ENCOUNTER — Ambulatory Visit (INDEPENDENT_AMBULATORY_CARE_PROVIDER_SITE_OTHER): Payer: 59 | Admitting: Internal Medicine

## 2012-05-30 ENCOUNTER — Encounter: Payer: Self-pay | Admitting: Internal Medicine

## 2012-05-30 VITALS — BP 110/64 | HR 83 | Temp 98.3°F | Resp 16 | Wt 330.0 lb

## 2012-05-30 DIAGNOSIS — M109 Gout, unspecified: Secondary | ICD-10-CM

## 2012-05-30 DIAGNOSIS — I10 Essential (primary) hypertension: Secondary | ICD-10-CM

## 2012-05-30 NOTE — Patient Instructions (Signed)

## 2012-05-30 NOTE — Assessment & Plan Note (Signed)
Resolved, she has decided not to stay on colchicine Will observe for now and treat again if needed

## 2012-05-30 NOTE — Assessment & Plan Note (Signed)
She has decided not to try belviq for now

## 2012-05-30 NOTE — Assessment & Plan Note (Signed)
Her BP is well controlled 

## 2012-05-30 NOTE — Progress Notes (Signed)
  Subjective:    Patient ID: Janice Huang, female    DOB: 09-Oct-1970, 42 y.o.   MRN: [id number]  Hypertension This is a chronic problem. The current episode started more than 1 year ago. The problem has been gradually improving since onset. The problem is controlled. Pertinent negatives include no anxiety, blurred vision, chest pain, headaches, malaise/fatigue, neck pain, orthopnea, palpitations, peripheral edema, PND, shortness of breath or sweats. Past treatments include ACE inhibitors and diuretics. The current treatment provides significant improvement. Compliance problems include exercise and diet.       Review of Systems  Constitutional: Negative.  Negative for malaise/fatigue.  HENT: Negative.  Negative for neck pain.   Eyes: Negative.  Negative for blurred vision.  Respiratory: Negative.  Negative for shortness of breath.   Cardiovascular: Negative.  Negative for chest pain, palpitations, orthopnea and PND.  Gastrointestinal: Negative.  Negative for nausea, vomiting, abdominal pain, diarrhea and constipation.  Genitourinary: Negative.   Musculoskeletal: Negative for myalgias, back pain, joint swelling, arthralgias and gait problem.  Skin: Negative for color change, pallor, rash and wound.  Neurological: Negative.  Negative for headaches.  Hematological: Negative for adenopathy. Does not bruise/bleed easily.  Psychiatric/Behavioral: Negative.        Objective:   Physical Exam  Vitals reviewed. Constitutional: She is oriented to person, place, and time. She appears well-developed and well-nourished. No distress.  HENT:  Head: Normocephalic and atraumatic.  Mouth/Throat: Oropharynx is clear and moist. No oropharyngeal exudate.  Eyes: Conjunctivae normal are normal. Right eye exhibits no discharge. Left eye exhibits no discharge. No scleral icterus.  Neck: Normal range of motion. Neck supple. No JVD present. No tracheal deviation present. No thyromegaly present.    Cardiovascular: Normal rate, regular rhythm, normal heart sounds and intact distal pulses.  Exam reveals no gallop and no friction rub.   No murmur heard. Pulmonary/Chest: Effort normal and breath sounds normal. No stridor. No respiratory distress. She has no wheezes. She has no rales. She exhibits no tenderness.  Abdominal: Soft. Bowel sounds are normal. She exhibits no distension and no mass. There is no tenderness. There is no rebound and no guarding.  Musculoskeletal: Normal range of motion. She exhibits no edema and no tenderness.       Right foot: Normal.       Left foot: Normal.  Lymphadenopathy:    She has no cervical adenopathy.  Neurological: She is oriented to person, place, and time.  Skin: Skin is warm and dry. No rash noted. She is not diaphoretic. No erythema. No pallor.  Psychiatric: She has a normal mood and affect. Her behavior is normal. Judgment and thought content normal.      Lab Results  Component Value Date   WBC 9.1 06/15/2011   HGB 12.5 06/15/2011   HCT 36.5 06/15/2011   PLT 268.0 06/15/2011   GLUCOSE 117* 05/06/2012   CHOL 239* 06/15/2011   TRIG 108.0 06/15/2011   HDL 47.40 06/15/2011   LDLDIRECT 186.9 06/15/2011   ALT 14 05/06/2012   AST 18 05/06/2012   NA 138 05/06/2012   K 3.5 05/06/2012   CL 98 05/06/2012   CREATININE 0.8 05/06/2012   BUN 15 05/06/2012   CO2 30 05/06/2012   TSH 1.18 06/15/2011   HGBA1C 6.4 05/06/2012      Assessment & Plan:

## 2012-06-05 ENCOUNTER — Telehealth: Payer: Self-pay | Admitting: *Deleted

## 2012-06-05 NOTE — Telephone Encounter (Signed)
done

## 2012-06-05 NOTE — Telephone Encounter (Signed)
PATIENT CALLED AND REQUESTED FOR YOU TO RECOMMEND A BARRIATRIC SURGEON FOR HER TO GO TO. CB #[phone]

## 2012-06-06 NOTE — Telephone Encounter (Signed)
Left message on CB# that a referral  Had been sent to the [facility] at [facility]. They will call her with the date and time to a bariatrics surgeon.

## 2012-07-07 ENCOUNTER — Other Ambulatory Visit: Payer: Self-pay | Admitting: Internal Medicine

## 2012-07-11 LAB — HM PAP SMEAR: HM Pap smear: NORMAL

## 2012-09-20 ENCOUNTER — Other Ambulatory Visit: Payer: Self-pay | Admitting: Internal Medicine

## 2013-01-02 ENCOUNTER — Other Ambulatory Visit: Payer: Self-pay | Admitting: Internal Medicine

## 2013-01-17 ENCOUNTER — Encounter: Payer: Self-pay | Admitting: Internal Medicine

## 2013-01-17 ENCOUNTER — Other Ambulatory Visit (INDEPENDENT_AMBULATORY_CARE_PROVIDER_SITE_OTHER): Payer: 59

## 2013-01-17 ENCOUNTER — Ambulatory Visit (INDEPENDENT_AMBULATORY_CARE_PROVIDER_SITE_OTHER): Payer: 59 | Admitting: Internal Medicine

## 2013-01-17 VITALS — BP 116/72 | HR 88 | Temp 98.6°F | Resp 16 | Wt 341.0 lb

## 2013-01-17 DIAGNOSIS — I10 Essential (primary) hypertension: Secondary | ICD-10-CM

## 2013-01-17 DIAGNOSIS — E7800 Pure hypercholesterolemia, unspecified: Secondary | ICD-10-CM

## 2013-01-17 DIAGNOSIS — M109 Gout, unspecified: Secondary | ICD-10-CM

## 2013-01-17 DIAGNOSIS — Z1231 Encounter for screening mammogram for malignant neoplasm of breast: Secondary | ICD-10-CM

## 2013-01-17 DIAGNOSIS — R7309 Other abnormal glucose: Secondary | ICD-10-CM

## 2013-01-17 DIAGNOSIS — Z23 Encounter for immunization: Secondary | ICD-10-CM

## 2013-01-17 LAB — CBC WITH DIFFERENTIAL/PLATELET
Basophils Relative: 0.5 % (ref 0.0–3.0)
Eosinophils Absolute: 0.1 10*3/uL (ref 0.0–0.7)
Hemoglobin: 13.1 g/dL (ref 12.0–15.0)
MCHC: 33.8 g/dL (ref 30.0–36.0)
MCV: 85.2 fl (ref 78.0–100.0)
Monocytes Absolute: 0.6 10*3/uL (ref 0.1–1.0)
Neutro Abs: 3.9 10*3/uL (ref 1.4–7.7)
RBC: 4.54 Mil/uL (ref 3.87–5.11)

## 2013-01-17 LAB — LIPID PANEL
HDL: 50.4 mg/dL (ref >39.00)
Total CHOL/HDL Ratio: 6
Triglycerides: 108 mg/dL (ref 0.0–149.0)

## 2013-01-17 LAB — COMPREHENSIVE METABOLIC PANEL
AST: 25 U/L (ref 0–37)
Alkaline Phosphatase: 45 U/L (ref 39–117)
BUN: 13 mg/dL (ref 6–23)
Creatinine, Ser: 0.8 mg/dL (ref 0.4–1.2)

## 2013-01-17 LAB — TSH: TSH: 0.93 u[IU]/mL (ref 0.35–5.50)

## 2013-01-17 MED ORDER — LISINOPRIL-HYDROCHLOROTHIAZIDE 20-25 MG PO TABS: 1.0000 | ORAL_TABLET | Freq: Every day | ORAL | Status: DC

## 2013-01-17 MED ORDER — ROSUVASTATIN CALCIUM 20 MG PO TABS: 20.0000 mg | ORAL_TABLET | Freq: Every day | ORAL | Status: DC

## 2013-01-17 NOTE — Assessment & Plan Note (Signed)
I will check her uric acid level today, the goal being <6 to reduce the risk of gout flares

## 2013-01-17 NOTE — Patient Instructions (Addendum)
Type 2 Diabetes Mellitus, Adult Type 2 diabetes mellitus, often simply referred to as type 2 diabetes, is a long-lasting (chronic) disease. In type 2 diabetes, the pancreas does not make enough insulin (a hormone), the cells are less responsive to the insulin that is made (insulin resistance), or both. Normally, insulin moves sugars from food into the tissue cells. The tissue cells use the sugars for energy. The lack of insulin or the lack of normal response to insulin causes excess sugars to build up in the blood instead of going into the tissue cells. As a result, high blood sugar (hyperglycemia) develops. The effect of high sugar (glucose) levels can cause many complications. Type 2 diabetes was also previously called adult-onset diabetes but it can occur at any age.  RISK FACTORS  A person is predisposed to developing type 2 diabetes if someone in the family has the disease and also has one or more of the following primary risk factors:  Overweight.  An inactive lifestyle.  A history of consistently eating high-calorie foods. Maintaining a normal weight and regular physical activity can reduce the chance of developing type 2 diabetes. SYMPTOMS  A person with type 2 diabetes may not show symptoms initially. The symptoms of type 2 diabetes appear slowly. The symptoms include:  Increased thirst (polydipsia).  Increased urination (polyuria).  Increased urination during the night (nocturia).  Weight loss. This weight loss may be rapid.  Frequent, recurring infections.  Tiredness (fatigue).  Weakness.  Vision changes, such as blurred vision.  Fruity smell to your breath.  Abdominal pain.  Nausea or vomiting.  Cuts or bruises which are slow to heal.  Tingling or numbness in the hands or feet. DIAGNOSIS Type 2 diabetes is frequently not diagnosed until complications of diabetes are present. Type 2 diabetes is diagnosed when symptoms or complications are present and when blood  glucose levels are increased. Your blood glucose level may be checked by one or more of the following blood tests:  A fasting blood glucose test. You will not be allowed to eat for at least 8 hours before a blood sample is taken.  A random blood glucose test. Your blood glucose is checked at any time of the day regardless of when you ate.  A hemoglobin A1c blood glucose test. A hemoglobin A1c test provides information about blood glucose control over the previous 3 months.  An oral glucose tolerance test (OGTT). Your blood glucose is measured after you have not eaten (fasted) for 2 hours and then after you drink a glucose-containing beverage. TREATMENT   You may need to take insulin or diabetes medicine daily to keep blood glucose levels in the desired range.  You will need to match insulin dosing with exercise and healthy food choices. The treatment goal is to maintain the before meal blood sugar (preprandial glucose) level at 70 130 mg/dL. HOME CARE INSTRUCTIONS   Have your hemoglobin A1c level checked twice a year.  Perform daily blood glucose monitoring as directed by your caregiver.  Monitor urine ketones when you are ill and as directed by your caregiver.  Take your diabetes medicine or insulin as directed by your caregiver to maintain your blood glucose levels in the desired range.  Never run out of diabetes medicine or insulin. It is needed every day.  Adjust insulin based on your intake of carbohydrates. Carbohydrates can raise blood glucose levels but need to be included in your diet. Carbohydrates provide vitamins, minerals, and fiber which are an essential part of   a healthy diet. Carbohydrates are found in fruits, vegetables, whole grains, dairy products, legumes, and foods containing added sugars.    Eat healthy foods. Alternate 3 meals with 3 snacks.  Lose weight if overweight.  Carry a medical alert card or wear your medical alert jewelry.  Carry a 15 gram  carbohydrate snack with you at all times to treat low blood glucose (hypoglycemia). Some examples of 15 gram carbohydrate snacks include:  Glucose tablets, 3 or 4   Glucose gel, 15 gram tube  Raisins, 2 tablespoons (24 grams)  Jelly beans, 6  Animal crackers, 8  Regular pop, 4 ounces (120 mL)  Gummy treats, 9  Recognize hypoglycemia. Hypoglycemia occurs with blood glucose levels of 70 mg/dL and below. The risk for hypoglycemia increases when fasting or skipping meals, during or after intense exercise, and during sleep. Hypoglycemia symptoms can include:  Tremors or shakes.  Decreased ability to concentrate.  Sweating.  Increased heart rate.  Headache.  Dry mouth.  Hunger.  Irritability.  Anxiety.  Restless sleep.  Altered speech or coordination.  Confusion.  Treat hypoglycemia promptly. If you are alert and able to safely swallow, follow the 15:15 rule:  Take 15 20 grams of rapid-acting glucose or carbohydrate. Rapid-acting options include glucose gel, glucose tablets, or 4 ounces (120 mL) of fruit juice, regular soda, or low fat milk.  Check your blood glucose level 15 minutes after taking the glucose.  Take 15 20 grams more of glucose if the repeat blood glucose level is still 70 mg/dL or below.  Eat a meal or snack within 1 hour once blood glucose levels return to normal.    Be alert to polyuria and polydipsia which are early signs of hyperglycemia. An early awareness of hyperglycemia allows for prompt treatment. Treat hyperglycemia as directed by your caregiver.  Engage in at least 150 minutes of moderate-intensity physical activity a week, spread over at least 3 days of the week or as directed by your caregiver. In addition, you should engage in resistance exercise at least 2 times a week or as directed by your caregiver.  Adjust your medicine and food intake as needed if you start a new exercise or sport.  Follow your sick day plan at any time you  are unable to eat or drink as usual.  Avoid tobacco use.  Limit alcohol intake to no more than 1 drink per day for nonpregnant women and 2 drinks per day for men. You should drink alcohol only when you are also eating food. Talk with your caregiver whether alcohol is safe for you. Tell your caregiver if you drink alcohol several times a week.  Follow up with your caregiver regularly.  Schedule an eye exam soon after the diagnosis of type 2 diabetes and then annually.  Perform daily skin and foot care. Examine your skin and feet daily for cuts, bruises, redness, nail problems, bleeding, blisters, or sores. A foot exam by a caregiver should be done annually.  Brush your teeth and gums at least twice a day and floss at least once a day. Follow up with your dentist regularly.  Share your diabetes management plan with your workplace or school.  Stay up-to-date with immunizations.  Learn to manage stress.  Obtain ongoing diabetes education and support as needed.  Participate in, or seek rehabilitation as needed to maintain or improve independence and quality of life. Request a physical or occupational therapy referral if you are having foot or hand numbness or difficulties with grooming,   dressing, eating, or physical activity. SEEK MEDICAL CARE IF:   You are unable to eat food or drink fluids for more than 6 hours.  You have nausea and vomiting for more than 6 hours.  Your blood glucose level is over 240 mg/dL.  There is a change in mental status.  You develop an additional serious illness.  You have diarrhea for more than 6 hours.  You have been sick or have had a fever for a couple of days and are not getting better.  You have pain during any physical activity.  SEEK IMMEDIATE MEDICAL CARE IF:  You have difficulty breathing.  You have moderate to large ketone levels. MAKE SURE YOU:  Understand these instructions.  Will watch your condition.  Will get help right away if  you are not doing well or get worse. Document Released: 05/08/2005 Document Revised: 01/31/2012 Document Reviewed: 12/05/2011 [employer] Patient Information 2014 [employer].  

## 2013-01-17 NOTE — Assessment & Plan Note (Signed)
I will check her A1C to see if she has developed DM2 

## 2013-01-17 NOTE — Progress Notes (Signed)
  Subjective:    Patient ID: Janice Huang, female    DOB: 21-Oct-1970, 42 y.o.   MRN: [id number]  Hypertension This is a chronic problem. The current episode started more than 1 year ago. The problem is unchanged. The problem is controlled. Pertinent negatives include no anxiety, blurred vision, chest pain, headaches, malaise/fatigue, neck pain, orthopnea, palpitations, peripheral edema, PND, shortness of breath or sweats. Risk factors for coronary artery disease include obesity. Past treatments include diuretics and angiotensin blockers. Compliance problems include exercise and diet.       Review of Systems  Constitutional: Negative.  Negative for fever, chills, malaise/fatigue, diaphoresis, activity change, appetite change, fatigue and unexpected weight change.  HENT: Negative.  Negative for neck pain.   Eyes: Negative.  Negative for blurred vision.  Respiratory: Negative.  Negative for apnea, cough, choking, chest tightness, shortness of breath, wheezing and stridor.   Cardiovascular: Negative.  Negative for chest pain, palpitations, orthopnea, leg swelling and PND.  Gastrointestinal: Negative.  Negative for nausea, vomiting, abdominal pain, diarrhea and constipation.  Endocrine: Negative.  Negative for polydipsia, polyphagia and polyuria.  Genitourinary: Negative.   Musculoskeletal: Negative.  Negative for myalgias, back pain, joint swelling and gait problem.  Skin: Negative.   Allergic/Immunologic: Negative.   Neurological: Negative.  Negative for dizziness, tremors, syncope, weakness, light-headedness and headaches.  Hematological: Negative.  Negative for adenopathy. Does not bruise/bleed easily.  Psychiatric/Behavioral: Negative.        Objective:   Physical Exam  Vitals reviewed. Constitutional: She is oriented to person, place, and time. She appears well-developed and well-nourished. No distress.  HENT:  Head: Normocephalic and atraumatic.  Mouth/Throat: Oropharynx is  clear and moist. No oropharyngeal exudate.  Eyes: Conjunctivae are normal. Right eye exhibits no discharge. Left eye exhibits no discharge. No scleral icterus.  Neck: Normal range of motion. Neck supple. No JVD present. No tracheal deviation present. No thyromegaly present.  Cardiovascular: Normal rate, regular rhythm, normal heart sounds and intact distal pulses.  Exam reveals no gallop and no friction rub.   No murmur heard. Pulmonary/Chest: Effort normal and breath sounds normal. No stridor. No respiratory distress. She has no wheezes. She has no rales. She exhibits no tenderness.  Abdominal: Soft. Bowel sounds are normal. She exhibits no distension and no mass. There is no tenderness. There is no rebound and no guarding.  Musculoskeletal: Normal range of motion. She exhibits no edema and no tenderness.  Lymphadenopathy:    She has no cervical adenopathy.  Neurological: She is oriented to person, place, and time.  Skin: Skin is warm and dry. No rash noted. She is not diaphoretic. No erythema. No pallor.  Psychiatric: She has a normal mood and affect. Her behavior is normal. Judgment and thought content normal.      Lab Results  Component Value Date   WBC 9.1 06/15/2011   HGB 12.5 06/15/2011   HCT 36.5 06/15/2011   PLT 268.0 06/15/2011   GLUCOSE 117* 05/06/2012   CHOL 239* 06/15/2011   TRIG 108.0 06/15/2011   HDL 47.40 06/15/2011   LDLDIRECT 186.9 06/15/2011   ALT 14 05/06/2012   AST 18 05/06/2012   NA 138 05/06/2012   K 3.5 05/06/2012   CL 98 05/06/2012   CREATININE 0.8 05/06/2012   BUN 15 05/06/2012   CO2 30 05/06/2012   TSH 1.18 06/15/2011   HGBA1C 6.4 05/06/2012      Assessment & Plan:

## 2013-01-17 NOTE — Assessment & Plan Note (Signed)
She is in the process of being evaluated for bariatric surgery

## 2013-01-17 NOTE — Assessment & Plan Note (Signed)
She agrees to start crestor today

## 2013-01-17 NOTE — Assessment & Plan Note (Signed)
Her BP is well controlled Will check her lytes and renal function today 

## 2013-01-19 ENCOUNTER — Encounter: Payer: Self-pay | Admitting: Internal Medicine

## 2013-01-19 MED ORDER — ALLOPURINOL 100 MG PO TABS: 100.0000 mg | ORAL_TABLET | Freq: Every day | ORAL | Status: DC

## 2013-01-19 MED ORDER — COLESEVELAM HCL 3.75 G PO PACK: 1.0000 | PACK | Freq: Every day | ORAL | Status: DC

## 2013-01-19 NOTE — Addendum Note (Signed)
Addended by: Etta Grandchild on: 01/19/2013 01:21 PM   Modules accepted: Orders

## 2013-04-30 LAB — HM DIABETES EYE EXAM

## 2013-05-07 ENCOUNTER — Other Ambulatory Visit: Payer: Self-pay | Admitting: Internal Medicine

## 2013-05-21 ENCOUNTER — Ambulatory Visit: Payer: 59 | Admitting: Internal Medicine

## 2013-05-23 ENCOUNTER — Ambulatory Visit (INDEPENDENT_AMBULATORY_CARE_PROVIDER_SITE_OTHER): Payer: 59

## 2013-05-23 ENCOUNTER — Ambulatory Visit (INDEPENDENT_AMBULATORY_CARE_PROVIDER_SITE_OTHER): Payer: 59 | Admitting: Internal Medicine

## 2013-05-23 ENCOUNTER — Encounter: Payer: Self-pay | Admitting: Internal Medicine

## 2013-05-23 VITALS — BP 120/80 | HR 84 | Temp 97.1°F | Resp 16 | Wt 340.0 lb

## 2013-05-23 DIAGNOSIS — Z1231 Encounter for screening mammogram for malignant neoplasm of breast: Secondary | ICD-10-CM

## 2013-05-23 DIAGNOSIS — M109 Gout, unspecified: Secondary | ICD-10-CM

## 2013-05-23 DIAGNOSIS — R7309 Other abnormal glucose: Secondary | ICD-10-CM

## 2013-05-23 DIAGNOSIS — E7800 Pure hypercholesterolemia, unspecified: Secondary | ICD-10-CM

## 2013-05-23 DIAGNOSIS — I10 Essential (primary) hypertension: Secondary | ICD-10-CM

## 2013-05-23 DIAGNOSIS — E785 Hyperlipidemia, unspecified: Secondary | ICD-10-CM

## 2013-05-23 LAB — COMPREHENSIVE METABOLIC PANEL
ALT: 24 U/L (ref 0–35)
AST: 24 U/L (ref 0–37)
Albumin: 4.2 g/dL (ref 3.5–5.2)
Alkaline Phosphatase: 48 U/L (ref 39–117)
BUN: 17 mg/dL (ref 6–23)
CALCIUM: 9.6 mg/dL (ref 8.4–10.5)
CHLORIDE: 100 meq/L (ref 96–112)
CO2: 29 mEq/L (ref 19–32)
CREATININE: 0.9 mg/dL (ref 0.4–1.2)
GFR: 89.14 mL/min (ref >60.00)
Glucose, Bld: 95 mg/dL (ref 70–99)
POTASSIUM: 4 meq/L (ref 3.5–5.1)
Sodium: 136 mEq/L (ref 135–145)
Total Bilirubin: 0.4 mg/dL (ref 0.3–1.2)
Total Protein: 7.8 g/dL (ref 6.0–8.3)

## 2013-05-23 LAB — LIPID PANEL
CHOL/HDL RATIO: 7
CHOLESTEROL: 265 mg/dL — AB (ref 0–200)
HDL: 38.8 mg/dL — ABNORMAL LOW (ref >39.00)
TRIGLYCERIDES: 129 mg/dL (ref 0.0–149.0)
VLDL: 25.8 mg/dL (ref 0.0–40.0)

## 2013-05-23 LAB — HEMOGLOBIN A1C: Hgb A1c MFr Bld: 6.5 % (ref 4.6–6.5)

## 2013-05-23 MED ORDER — ROSUVASTATIN CALCIUM 20 MG PO TABS: 20.0000 mg | ORAL_TABLET | Freq: Every day | ORAL | Status: DC

## 2013-05-23 MED ORDER — COLCHICINE 0.6 MG PO TABS: 0.6000 mg | ORAL_TABLET | Freq: Two times a day (BID) | ORAL | Status: DC

## 2013-05-23 MED ORDER — LISINOPRIL-HYDROCHLOROTHIAZIDE 20-25 MG PO TABS: 1.0000 | ORAL_TABLET | Freq: Every day | ORAL | Status: DC

## 2013-05-23 NOTE — Progress Notes (Signed)
   Subjective:    Patient ID: Janice Huang, female    DOB: 07-19-1970, 43 y.o.   MRN: [id number]  Hypertension This is a chronic problem. The current episode started more than 1 year ago. The problem is unchanged. The problem is controlled. Pertinent negatives include no anxiety, blurred vision, chest pain, headaches, malaise/fatigue, neck pain, orthopnea, palpitations, peripheral edema, PND, shortness of breath or sweats. There are no associated agents to hypertension. Past treatments include ACE inhibitors and diuretics. The current treatment provides moderate improvement. Compliance problems include diet and exercise.       Review of Systems  Constitutional: Negative.  Negative for fever, chills, malaise/fatigue, diaphoresis, appetite change and fatigue.  HENT: Negative.   Eyes: Negative.  Negative for blurred vision.  Respiratory: Negative.  Negative for cough, choking, chest tightness, shortness of breath, wheezing and stridor.   Cardiovascular: Negative.  Negative for chest pain, palpitations, orthopnea, leg swelling and PND.  Gastrointestinal: Negative.  Negative for nausea, vomiting, abdominal pain, diarrhea, constipation and blood in stool.  Endocrine: Negative.   Genitourinary: Negative.   Musculoskeletal: Negative.  Negative for arthralgias, back pain, myalgias, neck pain and neck stiffness.  Skin: Negative.   Allergic/Immunologic: Negative.   Neurological: Negative.  Negative for dizziness, seizures, syncope, speech difficulty, weakness, light-headedness and headaches.  Hematological: Negative.  Negative for adenopathy. Does not bruise/bleed easily.  Psychiatric/Behavioral: Negative.        Objective:   Physical Exam  Vitals reviewed. Constitutional: She is oriented to person, place, and time. She appears well-developed and well-nourished. No distress.  HENT:  Head: Normocephalic and atraumatic.  Mouth/Throat: Oropharynx is clear and moist. No oropharyngeal  exudate.  Eyes: Conjunctivae are normal. Right eye exhibits no discharge. Left eye exhibits no discharge. No scleral icterus.  Neck: Normal range of motion. Neck supple. No JVD present. No tracheal deviation present. No thyromegaly present.  Cardiovascular: Normal rate, regular rhythm, normal heart sounds and intact distal pulses.  Exam reveals no gallop and no friction rub.   No murmur heard. Pulmonary/Chest: Effort normal and breath sounds normal. No stridor. No respiratory distress. She has no wheezes. She has no rales. She exhibits no tenderness.  Abdominal: Soft. Bowel sounds are normal. She exhibits no distension and no mass. There is no tenderness. There is no rebound and no guarding.  Musculoskeletal: Normal range of motion. She exhibits no edema and no tenderness.  Lymphadenopathy:    She has no cervical adenopathy.  Neurological: She is oriented to person, place, and time.  Skin: Skin is warm and dry. No rash noted. She is not diaphoretic. No erythema. No pallor.  Psychiatric: She has a normal mood and affect. Her behavior is normal. Judgment and thought content normal.     Lab Results  Component Value Date   WBC 8.1 01/17/2013   HGB 13.1 01/17/2013   HCT 38.7 01/17/2013   PLT 261.0 01/17/2013   GLUCOSE 100* 01/17/2013   CHOL 284* 01/17/2013   TRIG 108.0 01/17/2013   HDL 50.40 01/17/2013   LDLDIRECT 230.2 01/17/2013   ALT 29 01/17/2013   AST 25 01/17/2013   NA 137 01/17/2013   K 4.1 01/17/2013   CL 99 01/17/2013   CREATININE 0.8 01/17/2013   BUN 13 01/17/2013   CO2 30 01/17/2013   TSH 0.93 01/17/2013   HGBA1C 6.5 01/17/2013       Assessment & Plan:

## 2013-05-23 NOTE — Patient Instructions (Signed)
Type 2 Diabetes Mellitus, Adult Type 2 diabetes mellitus, often simply referred to as type 2 diabetes, is a long-lasting (chronic) disease. In type 2 diabetes, the pancreas does not make enough insulin (a hormone), the cells are less responsive to the insulin that is made (insulin resistance), or both. Normally, insulin moves sugars from food into the tissue cells. The tissue cells use the sugars for energy. The lack of insulin or the lack of normal response to insulin causes excess sugars to build up in the blood instead of going into the tissue cells. As a result, high blood sugar (hyperglycemia) develops. The effect of high sugar (glucose) levels can cause many complications. Type 2 diabetes was also previously called adult-onset diabetes but it can occur at any age.  RISK FACTORS  A person is predisposed to developing type 2 diabetes if someone in the family has the disease and also has one or more of the following primary risk factors:  Overweight.  An inactive lifestyle.  A history of consistently eating high-calorie foods. Maintaining a normal weight and regular physical activity can reduce the chance of developing type 2 diabetes. SYMPTOMS  A person with type 2 diabetes may not show symptoms initially. The symptoms of type 2 diabetes appear slowly. The symptoms include:  Increased thirst (polydipsia).  Increased urination (polyuria).  Increased urination during the night (nocturia).  Weight loss. This weight loss may be rapid.  Frequent, recurring infections.  Tiredness (fatigue).  Weakness.  Vision changes, such as blurred vision.  Fruity smell to your breath.  Abdominal pain.  Nausea or vomiting.  Cuts or bruises which are slow to heal.  Tingling or numbness in the hands or feet. DIAGNOSIS Type 2 diabetes is frequently not diagnosed until complications of diabetes are present. Type 2 diabetes is diagnosed when symptoms or complications are present and when blood  glucose levels are increased. Your blood glucose level may be checked by one or more of the following blood tests:  A fasting blood glucose test. You will not be allowed to eat for at least 8 hours before a blood sample is taken.  A random blood glucose test. Your blood glucose is checked at any time of the day regardless of when you ate.  A hemoglobin A1c blood glucose test. A hemoglobin A1c test provides information about blood glucose control over the previous 3 months.  An oral glucose tolerance test (OGTT). Your blood glucose is measured after you have not eaten (fasted) for 2 hours and then after you drink a glucose-containing beverage. TREATMENT   You may need to take insulin or diabetes medicine daily to keep blood glucose levels in the desired range.  You will need to match insulin dosing with exercise and healthy food choices. The treatment goal is to maintain the before meal blood sugar (preprandial glucose) level at 70 130 mg/dL. HOME CARE INSTRUCTIONS   Have your hemoglobin A1c level checked twice a year.  Perform daily blood glucose monitoring as directed by your caregiver.  Monitor urine ketones when you are ill and as directed by your caregiver.  Take your diabetes medicine or insulin as directed by your caregiver to maintain your blood glucose levels in the desired range.  Never run out of diabetes medicine or insulin. It is needed every day.  Adjust insulin based on your intake of carbohydrates. Carbohydrates can raise blood glucose levels but need to be included in your diet. Carbohydrates provide vitamins, minerals, and fiber which are an essential part of   a healthy diet. Carbohydrates are found in fruits, vegetables, whole grains, dairy products, legumes, and foods containing added sugars.    Eat healthy foods. Alternate 3 meals with 3 snacks.  Lose weight if overweight.  Carry a medical alert card or wear your medical alert jewelry.  Carry a 15 gram  carbohydrate snack with you at all times to treat low blood glucose (hypoglycemia). Some examples of 15 gram carbohydrate snacks include:  Glucose tablets, 3 or 4   Glucose gel, 15 gram tube  Raisins, 2 tablespoons (24 grams)  Jelly beans, 6  Animal crackers, 8  Regular pop, 4 ounces (120 mL)  Gummy treats, 9  Recognize hypoglycemia. Hypoglycemia occurs with blood glucose levels of 70 mg/dL and below. The risk for hypoglycemia increases when fasting or skipping meals, during or after intense exercise, and during sleep. Hypoglycemia symptoms can include:  Tremors or shakes.  Decreased ability to concentrate.  Sweating.  Increased heart rate.  Headache.  Dry mouth.  Hunger.  Irritability.  Anxiety.  Restless sleep.  Altered speech or coordination.  Confusion.  Treat hypoglycemia promptly. If you are alert and able to safely swallow, follow the 15:15 rule:  Take 15 20 grams of rapid-acting glucose or carbohydrate. Rapid-acting options include glucose gel, glucose tablets, or 4 ounces (120 mL) of fruit juice, regular soda, or low fat milk.  Check your blood glucose level 15 minutes after taking the glucose.  Take 15 20 grams more of glucose if the repeat blood glucose level is still 70 mg/dL or below.  Eat a meal or snack within 1 hour once blood glucose levels return to normal.    Be alert to polyuria and polydipsia which are early signs of hyperglycemia. An early awareness of hyperglycemia allows for prompt treatment. Treat hyperglycemia as directed by your caregiver.  Engage in at least 150 minutes of moderate-intensity physical activity a week, spread over at least 3 days of the week or as directed by your caregiver. In addition, you should engage in resistance exercise at least 2 times a week or as directed by your caregiver.  Adjust your medicine and food intake as needed if you start a new exercise or sport.  Follow your sick day plan at any time you  are unable to eat or drink as usual.  Avoid tobacco use.  Limit alcohol intake to no more than 1 drink per day for nonpregnant women and 2 drinks per day for men. You should drink alcohol only when you are also eating food. Talk with your caregiver whether alcohol is safe for you. Tell your caregiver if you drink alcohol several times a week.  Follow up with your caregiver regularly.  Schedule an eye exam soon after the diagnosis of type 2 diabetes and then annually.  Perform daily skin and foot care. Examine your skin and feet daily for cuts, bruises, redness, nail problems, bleeding, blisters, or sores. A foot exam by a caregiver should be done annually.  Brush your teeth and gums at least twice a day and floss at least once a day. Follow up with your dentist regularly.  Share your diabetes management plan with your workplace or school.  Stay up-to-date with immunizations.  Learn to manage stress.  Obtain ongoing diabetes education and support as needed.  Participate in, or seek rehabilitation as needed to maintain or improve independence and quality of life. Request a physical or occupational therapy referral if you are having foot or hand numbness or difficulties with grooming,   dressing, eating, or physical activity. SEEK MEDICAL CARE IF:   You are unable to eat food or drink fluids for more than 6 hours.  You have nausea and vomiting for more than 6 hours.  Your blood glucose level is over 240 mg/dL.  There is a change in mental status.  You develop an additional serious illness.  You have diarrhea for more than 6 hours.  You have been sick or have had a fever for a couple of days and are not getting better.  You have pain during any physical activity.  SEEK IMMEDIATE MEDICAL CARE IF:  You have difficulty breathing.  You have moderate to large ketone levels. MAKE SURE YOU:  Understand these instructions.  Will watch your condition.  Will get help right away if  you are not doing well or get worse. Document Released: 05/08/2005 Document Revised: 01/31/2012 Document Reviewed: 12/05/2011 [employer] Patient Information 2014 [employer].  

## 2013-05-23 NOTE — Progress Notes (Signed)
Pre visit review using our clinic review tool, if applicable. No additional management support is needed unless otherwise documented below in the visit note. 

## 2013-05-25 ENCOUNTER — Encounter: Payer: Self-pay | Admitting: Internal Medicine

## 2013-05-25 NOTE — Assessment & Plan Note (Signed)
Her BP is well controlled Today I will check her lytes and renal function 

## 2013-05-25 NOTE — Assessment & Plan Note (Signed)
I will recheck her FLP today and will advise further if needed

## 2013-05-25 NOTE — Assessment & Plan Note (Signed)
I will recheck her A1C today and will treat if needed 

## 2013-05-25 NOTE — Assessment & Plan Note (Signed)
I completed the forms today for her to start the program for bariatric surgery She does not want to take any appetite suppressant meds at this time

## 2013-05-26 ENCOUNTER — Encounter: Payer: Self-pay | Admitting: Internal Medicine

## 2013-05-26 LAB — LDL CHOLESTEROL, DIRECT: Direct LDL: 217.2 mg/dL

## 2013-05-26 LAB — TSH: TSH: 1.19 u[IU]/mL (ref 0.35–5.50)

## 2013-06-12 ENCOUNTER — Other Ambulatory Visit (INDEPENDENT_AMBULATORY_CARE_PROVIDER_SITE_OTHER): Payer: Self-pay

## 2013-06-12 ENCOUNTER — Ambulatory Visit (INDEPENDENT_AMBULATORY_CARE_PROVIDER_SITE_OTHER): Payer: 59 | Admitting: Surgery

## 2013-06-12 DIAGNOSIS — E1165 Type 2 diabetes mellitus with hyperglycemia: Secondary | ICD-10-CM

## 2013-06-12 DIAGNOSIS — IMO0001 Reserved for inherently not codable concepts without codable children: Secondary | ICD-10-CM

## 2013-06-12 NOTE — Addendum Note (Signed)
Addended byIvory Broad: Chaeli Judy on: 06/12/2013 12:00 PM   Modules accepted: Orders

## 2013-06-12 NOTE — Patient Instructions (Signed)
Sleeve Gastrectomy A sleeve gastrectomy is a surgery in which a large portion of the stomach is removed. After the surgery, the stomach will be a narrow tube about the size of a banana. This surgery is performed to help a person lose weight. The person loses weight because the reduced size of the stomach restricts the amount of food that the person can eat. The stomach will hold much less food than before the surgery. Also, the part of the stomach that is removed produces a hormone that causes hunger.  This surgery is done for people who have morbid obesity, defined as a body mass index (BMI) greater than 40. BMI is an estimate of body fat and is calculated from the height and weight of a person. This surgery may also be done for people with a BMI between 35 and 40 if they have other diseases, such as type 2 diabetes mellitus, obstructive sleep apnea, or heart and lung disorders (cardiopulmonary diseases).  LET YOUR HEALTH CARE PROVIDER KNOW ABOUT:  Any allergies you have.   All medicines you are taking, including vitamins, herbs, eyedrops, creams, and over-the-counter medicines.   Use of steroids (by mouth or creams).   Previous problems you or members of your family have had with the use of anesthetics.   Any blood disorders you have.   Previous surgeries you have had.   Possibility of pregnancy, if this applies.   Other health problems you have. RISKS AND COMPLICATIONS Generally, sleeve gastrectomy is a safe procedure. However, as with any procedure, complications can occur. Possible complications include:  Infection.  Bleeding.  Blood clots.  Damage to other organs or tissue.  Leakage of fluid from the stomach into the abdominal cavity (rare). BEFORE THE PROCEDURE  You may need to have blood tests and imaging tests (such as X-rays or ultrasonography) done before the day of surgery. A test to evaluate your esophagus and how it moves (esophageal manometry) may also be  done.  You may be placed on a liquid diet 2 3 weeks before the surgery.  Ask your health care provider about changing or stopping your regular medicines.  Do not eat or drink anything for at least 8 hours before the procedure.   Make plans to have someone drive you home after your hospital stay. Also arrange for someone to help you with activities during recovery. PROCEDURE  A laparoscopic technique is usually used for this surgery:  You will be given medicine to make you sleep through the procedure (general anesthetic). This medicine will be given through an intravenous (IV) access tube that is put into one of your veins.  Once you are asleep, your abdomen will be cleaned and sterilized.  Several small incisions will be made in your abdomen.  Your abdomen will be filled with air so that it expands. This gives the surgeon more room to operate and makes your organs easier to see.  A thin, lighted tube with a tiny camera on the end (laparoscope) is put through a small incision in your abdomen. The camera on the laparoscope sends a picture to a TV screen in the operating room. This gives the surgeon a good view inside the abdomen.  Hollow tubes are put through the other small incisions in your abdomen. The tools needed for the procedure are put through these tubes.  The surgeon uses staples to divide part of the stomach and then removes it through one of the incisions.  The remaining stomach may be reinforced using   stitches or surgical glue or both to prevent leakage of the stomach contents. A small tube (drain) may be placed through one of the incisions to allow extra fluid to flow from the area.  The incisions are closed with stitches, staples, or glue. AFTER THE PROCEDURE  You will be monitored closely in a recovery area. Once the anesthetic has worn off, you will likely be moved to a regular hospital room.  You will be given medicine for pain and nausea.   You may have a drain  from one of the incisions in your abdomen. If a drain is used, it may stay in place after you go home from the hospital and be removed at a follow-up appointment.   You will be encouraged to walk around several times a day. This helps prevent blood clots.  You will be started on a liquid diet the first day after your surgery. Sometimes a test is done to check for leaking before you can eat.  You will be urged to cough and do deep breathing exercises. This helps prevent a lung infection after a surgery.  You will likely need to stay in the hospital for a few days.  Document Released: 03/05/2009 Document Revised: 01/08/2013 Document Reviewed: 09/20/2012 [employer] Patient Information 2014 [employer].  

## 2013-06-12 NOTE — Progress Notes (Signed)
Chief Complaint:  Morbid obesity BMI 53  History of Present Illness:  Janice Huang is an 43 y.o. female who has been to one of our seminars and 2 his prediabetic, has got her right foot, and his hypertension and desires bariatric surgery. She has friends who had various procedures and she is interested in a sleeve gastrectomy.  I reviewed all of the procedures with her using the flip chart and she is still most interested in a sleeve gastrectomy. Since she does have gout she might occasionally needs NSAIDS and that would be a better procedure for that.  She's never had any problems or DVT. She has had a hysterectomy.  Past Medical History  Diagnosis Date  . Hypertension   . Hyperlipidemia     Past Surgical History  Procedure Laterality Date  . Abdominal hysterectomy      1 ovary removed    Current Outpatient Prescriptions  Medication Sig Dispense Refill  . acetaminophen (TYLENOL) 325 MG tablet Take 650 mg by mouth every 6 (six) hours as needed. Pain      . colchicine (COLCRYS) 0.6 MG tablet Take 1 tablet (0.6 mg total) by mouth 2 (two) times daily.  180 tablet  3  . lisinopril-hydrochlorothiazide (PRINZIDE,ZESTORETIC) 20-25 MG per tablet Take 1 tablet by mouth daily.  90 tablet  2  . rosuvastatin (CRESTOR) 20 MG tablet Take 1 tablet (20 mg total) by mouth daily.  90 tablet  3  . atorvastatin (LIPITOR) 40 MG tablet Take 1 tablet (40 mg total) by mouth daily.  90 tablet  3   No current facility-administered medications for this visit.   Codeine and Penicillins Family History  Problem Relation Age of Onset  . Hypertension Other   . Diabetes Other     type 2  . Cancer Neg Hx   . Heart disease Neg Hx   . Hyperlipidemia Neg Hx   . Stroke Neg Hx    Social History:   reports that she has never smoked. She does not have any smokeless tobacco history on file. She reports that she does not drink alcohol or use illicit drugs.   REVIEW OF SYSTEMS - PERTINENT POSITIVES ONLY: Review  of systems was negative as listed on the sheet.  Physical Exam:   Blood pressure 124/82, pulse 72, temperature 98.3 F (36.8 C), temperature source Oral, resp. rate 18, height 5' 7.5" (1.715 m), weight 340 lb 8 oz (154.45 kg). Body mass index is 52.51 kg/(m^2).  Gen:  WDWN African American female NAD  Neurological: Alert and oriented to person, place, and time. Motor and sensory function is grossly intact  Head: Normocephalic and atraumatic.  Eyes: Conjunctivae are normal. Pupils are equal, round, and reactive to light. No scleral icterus.  Neck: Normal range of motion. Neck supple. No tracheal deviation or thyromegaly present.  Cardiovascular:  SR without murmurs or gallops.  No carotid bruits Respiratory: Effort normal.  No respiratory distress. No chest wall tenderness. Breath sounds normal.  No wheezes, rales or rhonchi.  Abdomen:  Obese nontender GU: Musculoskeletal: Normal range of motion. Extremities are nontender. No cyanosis, edema or clubbing noted Lymphadenopathy: No cervical, preauricular, postauricular or axillary adenopathy is present Skin: Skin is warm and dry. No rash noted. No diaphoresis. No erythema. No pallor. Pscyh: Normal mood and affect. Behavior is normal. Judgment and thought content normal.   LABORATORY RESULTS: No results found for this or any previous visit (from the past 48 hour(s)).  RADIOLOGY RESULTS: No results found.  Problem List: Patient Active Problem List   Diagnosis Date Noted  . Type II or unspecified type diabetes mellitus without mention of complication, uncontrolled 05/06/2012  . Gout 05/06/2012  . Morbid obesity 06/15/2011  . Essential hypertension, benign 12/16/2010  . Hyperlipidemia LDL goal < 100 12/16/2010  . Other screening mammogram 12/16/2010  . ALLERGIC RHINITIS CAUSE UNSPECIFIED 04/16/2010    Assessment & Plan: 1 obesity BMI 53 with pre-diabetes,  Hypertension,  and gout.    Matt B. Daphine Deutscher, MD, [facility] [phone] beeper [phone]  06/12/2013 11:33 AM

## 2013-06-18 ENCOUNTER — Telehealth (INDEPENDENT_AMBULATORY_CARE_PROVIDER_SITE_OTHER): Payer: Self-pay

## 2013-06-18 ENCOUNTER — Ambulatory Visit (INDEPENDENT_AMBULATORY_CARE_PROVIDER_SITE_OTHER): Payer: 59 | Admitting: Surgery

## 2013-06-18 ENCOUNTER — Other Ambulatory Visit (INDEPENDENT_AMBULATORY_CARE_PROVIDER_SITE_OTHER): Payer: Self-pay | Admitting: Surgery

## 2013-06-18 ENCOUNTER — Encounter (INDEPENDENT_AMBULATORY_CARE_PROVIDER_SITE_OTHER): Payer: Self-pay | Admitting: Surgery

## 2013-06-18 NOTE — Patient Instructions (Signed)
Gastric Banding Surgery Care After Refer to this sheet in the next few weeks. These discharge instructions provide you with general information on caring for yourself after you leave the hospital. Your caregiver may also give you specific instructions. Your treatment has been planned according to the most current medical practices available, but unavoidable complications sometimes occur. If you have any problems or questions after discharge, call your caregiver. HOME CARE INSTRUCTIONS  Activity  Take frequent walks throughout the day. This will help to prevent blood clots. Do not sit for longer than 45 minutes to 1 hour while awake for 4 to 6 weeks after surgery.  Continue to do coughing and deep breathing exercises once you get home. This will help to prevent pneumonia.  Do not do strenuous activities, such as heavy lifting, pushing, or pulling, until after your follow-up visit with your caregiver. Do not lift anything heavier than 10 lb (4.5 kg).  Talk with your caregiver about when you may return to work and your exercise routine.  Do not drive while taking prescription pain medicine. Nutrition  It is very important that you drink at least 80 oz (2,400 mL) of fluid a day.  You should stay on a liquid diet until your follow-up visit with your caregiver. Keep sugar-free, liquid items on hand, including:  Tea: hot or cold. Drink only decaffeinated for the first month.  Broths: beef, chicken, vegetable.  Others: water, sugar-free frozen ice pops, flavored water, gelatin (after 1 week).  Do not consume caffeine for 1 month. Large amounts of caffeine can cause dehydration.  A dietician may also give you specific instructions.  Follow your caregiver's recommendations about vitamins and protein requirements after surgery. Hygiene  You may shower and wash your hair 2 days after surgery. Pat incisions dry. Do not rub incisions with a washcloth or towel.  Follow your caregiver's  recommendations about baths and pools following surgery. Pain control  If a prescription medicine was given, follow your caregiver's directions.  You may feel some gas pain caused by the carbon dioxide used to inflate your abdomen during surgery. This pain can be felt in your chest, shoulder, back, or abdominal area. Moving around often is advised. Incision care You may have 4 or more small incisions. They are closed with skin adhesive strips and have a clear plastic covering over them. You may remove your dressings the number of days directed by your caregiver after surgery. Check your incisions and surrounding area daily for any redness, swelling, discoloration, fluid (drainage), or bleeding. Dark red, dried blood may appear under these coverings. This is normal. SEEK MEDICAL CARE IF:   You develop persistent nausea and vomiting.  You have pain and discomfort with swallowing.  You have pain, swelling, or warmth in the lower extremities.  You have an oral temperature above 102 F (38.9 C).  You develop chills.  Your incision sites look red, swollen, or have drainage.  Your stool is black, tarry, or maroon in color.  You are lightheaded when standing.  You have any questions or concerns. SEEK IMMEDIATE MEDICAL CARE IF:   You have chest pain.  You have severe calf pain or pain not relieved by medicine.  You develop shortness of breath or difficulty breathing.  You feel confused.  You have slurred speech.  You suddenly feel weak. MAKE SURE YOU:   Understand these instructions.  Will watch your condition.  Will get help right away if you are not doing well or get worse. Document Released:  12/21/2003 Document Revised: 09/02/2012 Document Reviewed: 09/28/2009 ExitCare Patient Information 2014 [place] [place].

## 2013-06-18 NOTE — Telephone Encounter (Signed)
Message copied by Maryan PulsMOORE, Trayvion Embleton on Wed Jun 18, 2013 10:59 AM ------      Message from: Mervin KungPEGRAM, SONYA      Created: Tue Jun 17, 2013  9:48 AM      Contact: [phone]       Pt called she has a questions about the Gastric sleeve she wants to get answered.       She wants to know if we will do the mini gastric where part of the stomach is left.       sonya ------

## 2013-06-18 NOTE — Progress Notes (Signed)
Return visit to discuss bariatric options. Again discussed the mini gastric bypass, Roux-en-Y gastric bypass, the sleeve gastrectomy, and the lap band. I explained to her that the LAP-BAND would be the least invasive and lowest complication right but would probably offer the lowest percent excess weight loss. She is uncomfortable with having 80% of her stomach removed in a sleeve. It is no longer intestines rerouting any form of gastric bypass.  Therefore the patient is changed her mind and would like to be considered for laparoscopic adjustable gastric banding

## 2013-06-18 NOTE — Telephone Encounter (Signed)
Called and left message for patient to call our office.  Patient called with questions.

## 2013-06-19 LAB — CBC WITH DIFFERENTIAL/PLATELET
BASOS ABS: 0 10*3/uL (ref 0.0–0.2)
Basos: 0 %
EOS: 1 %
Eosinophils Absolute: 0.1 10*3/uL (ref 0.0–0.4)
HEMATOCRIT: 37.2 % (ref 34.0–46.6)
Hemoglobin: 12.8 g/dL (ref 11.1–15.9)
LYMPHS ABS: 4.1 10*3/uL — AB (ref 0.7–3.1)
LYMPHS: 45 %
MCH: 28.6 pg (ref 26.6–33.0)
MCHC: 34.4 g/dL (ref 31.5–35.7)
MCV: 83 fL (ref 79–97)
MONOCYTES: 11 %
Monocytes Absolute: 1 10*3/uL — ABNORMAL HIGH (ref 0.1–0.9)
NEUTROS PCT: 43 %
Neutrophils Absolute: 4 10*3/uL (ref 1.4–7.0)
RBC: 4.48 x10E6/uL (ref 3.77–5.28)
RDW: 14 % (ref 12.3–15.4)
WBC: 9.2 10*3/uL (ref 3.4–10.8)

## 2013-06-19 LAB — T4: T4 TOTAL: 9.3 ug/dL (ref 4.5–12.0)

## 2013-06-24 NOTE — Addendum Note (Signed)
Addended by: Maryan PulsMOORE, Jlon Betker on: 06/24/2013 02:30 PM   Modules accepted: Orders

## 2013-07-08 ENCOUNTER — Ambulatory Visit (HOSPITAL_COMMUNITY): Payer: 59

## 2013-07-08 ENCOUNTER — Inpatient Hospital Stay (HOSPITAL_COMMUNITY): Admission: RE | Admit: 2013-07-08 | Payer: 59 | Source: Ambulatory Visit

## 2013-07-08 ENCOUNTER — Other Ambulatory Visit (HOSPITAL_COMMUNITY): Payer: 59

## 2013-07-08 ENCOUNTER — Ambulatory Visit: Payer: 59 | Admitting: Dietician

## 2013-07-11 ENCOUNTER — Encounter: Payer: 59 | Attending: Surgery | Admitting: Dietician

## 2013-07-11 ENCOUNTER — Encounter: Payer: Self-pay | Admitting: Dietician

## 2013-07-11 DIAGNOSIS — Z01818 Encounter for other preprocedural examination: Secondary | ICD-10-CM | POA: Insufficient documentation

## 2013-07-11 DIAGNOSIS — Z6841 Body Mass Index (BMI) 40.0 and over, adult: Secondary | ICD-10-CM | POA: Insufficient documentation

## 2013-07-11 NOTE — Patient Instructions (Signed)
Patient to call the [facility] to enroll in Pre-Op and Post-Op Nutrition Education when surgery date is scheduled. 

## 2013-07-11 NOTE — Progress Notes (Signed)
  Pre-Op Assessment Visit:  Pre-Operative LAGB Surgery  Medical Nutrition Therapy:  Appt start time: 1545   End time:  1615.  Patient was seen on 07/11/2013 for Pre-Operative LAGB Nutrition Assessment. [facility] on 07/11/2013.   Preferred Learning Style:   No preference indicated   Learning Readiness:  Ready   Handouts given during visit include:  Pre-Op Goals Bariatric Surgery Protein Shakes  Teaching Method Utilized:  Visual Auditory Hands on  Barriers to learning/adherence to lifestyle change: none  Demonstrated degree of understanding via:  Teach Back   Patient to call the [facility] to enroll in Pre-Op and Post-Op Nutrition Education when surgery date is scheduled.

## 2013-07-17 ENCOUNTER — Other Ambulatory Visit (INDEPENDENT_AMBULATORY_CARE_PROVIDER_SITE_OTHER): Payer: Self-pay | Admitting: Surgery

## 2013-07-17 ENCOUNTER — Ambulatory Visit (HOSPITAL_COMMUNITY): Payer: 59

## 2013-07-17 ENCOUNTER — Other Ambulatory Visit (HOSPITAL_COMMUNITY): Payer: 59

## 2013-07-17 ENCOUNTER — Other Ambulatory Visit: Payer: Self-pay

## 2013-07-17 ENCOUNTER — Ambulatory Visit (HOSPITAL_COMMUNITY)
Admission: RE | Admit: 2013-07-17 | Discharge: 2013-07-17 | Disposition: A | Discharge status: Home / Self Care | Payer: 59 | Source: Ambulatory Visit | Attending: Surgery | Admitting: Surgery

## 2013-07-17 DIAGNOSIS — Z1231 Encounter for screening mammogram for malignant neoplasm of breast: Secondary | ICD-10-CM | POA: Insufficient documentation

## 2013-07-18 LAB — HM MAMMOGRAPHY: HM MAMMO: NORMAL

## 2013-07-28 ENCOUNTER — Ambulatory Visit (HOSPITAL_COMMUNITY)
Admission: RE | Admit: 2013-07-28 | Discharge: 2013-07-28 | Disposition: A | Discharge status: Home / Self Care | Payer: 59 | Source: Ambulatory Visit | Attending: Surgery | Admitting: Surgery

## 2013-07-28 DIAGNOSIS — Z6841 Body Mass Index (BMI) 40.0 and over, adult: Secondary | ICD-10-CM | POA: Insufficient documentation

## 2013-07-28 DIAGNOSIS — Z01811 Encounter for preprocedural respiratory examination: Secondary | ICD-10-CM | POA: Insufficient documentation

## 2013-07-28 DIAGNOSIS — E119 Type 2 diabetes mellitus without complications: Secondary | ICD-10-CM | POA: Insufficient documentation

## 2013-07-28 DIAGNOSIS — M109 Gout, unspecified: Secondary | ICD-10-CM | POA: Insufficient documentation

## 2013-07-28 DIAGNOSIS — E785 Hyperlipidemia, unspecified: Secondary | ICD-10-CM | POA: Insufficient documentation

## 2013-07-28 DIAGNOSIS — K449 Diaphragmatic hernia without obstruction or gangrene: Secondary | ICD-10-CM | POA: Insufficient documentation

## 2013-07-28 DIAGNOSIS — I10 Essential (primary) hypertension: Secondary | ICD-10-CM | POA: Insufficient documentation

## 2013-07-28 DIAGNOSIS — K7689 Other specified diseases of liver: Secondary | ICD-10-CM | POA: Insufficient documentation

## 2013-08-19 ENCOUNTER — Other Ambulatory Visit: Payer: Self-pay | Admitting: Internal Medicine

## 2013-09-25 ENCOUNTER — Ambulatory Visit: Payer: 59 | Admitting: Internal Medicine

## 2013-10-09 ENCOUNTER — Encounter: Payer: Self-pay | Admitting: Internal Medicine

## 2013-10-09 ENCOUNTER — Other Ambulatory Visit (INDEPENDENT_AMBULATORY_CARE_PROVIDER_SITE_OTHER): Payer: 59

## 2013-10-09 ENCOUNTER — Ambulatory Visit (INDEPENDENT_AMBULATORY_CARE_PROVIDER_SITE_OTHER): Payer: 59 | Admitting: Internal Medicine

## 2013-10-09 VITALS — BP 120/84 | HR 84 | Temp 98.5°F | Resp 16 | Ht 67.5 in | Wt 344.4 lb

## 2013-10-09 DIAGNOSIS — E785 Hyperlipidemia, unspecified: Secondary | ICD-10-CM

## 2013-10-09 DIAGNOSIS — I10 Essential (primary) hypertension: Secondary | ICD-10-CM

## 2013-10-09 DIAGNOSIS — IMO0001 Reserved for inherently not codable concepts without codable children: Secondary | ICD-10-CM

## 2013-10-09 DIAGNOSIS — E1165 Type 2 diabetes mellitus with hyperglycemia: Secondary | ICD-10-CM

## 2013-10-09 LAB — BASIC METABOLIC PANEL
BUN: 13 mg/dL (ref 6–23)
CALCIUM: 9.6 mg/dL (ref 8.4–10.5)
CHLORIDE: 97 meq/L (ref 96–112)
CO2: 30 mEq/L (ref 19–32)
CREATININE: 0.9 mg/dL (ref 0.4–1.2)
GFR: 88.98 mL/min (ref >60.00)
Glucose, Bld: 125 mg/dL — ABNORMAL HIGH (ref 70–99)
Potassium: 3.7 mEq/L (ref 3.5–5.1)
Sodium: 136 mEq/L (ref 135–145)

## 2013-10-09 LAB — HEMOGLOBIN A1C: HEMOGLOBIN A1C: 6.5 % (ref 4.6–6.5)

## 2013-10-09 MED ORDER — ATORVASTATIN CALCIUM 40 MG PO TABS: 40.0000 mg | ORAL_TABLET | Freq: Every day | ORAL | Status: DC

## 2013-10-09 NOTE — Assessment & Plan Note (Signed)
Her BP is well controlled Her lytes and renal function are stable 

## 2013-10-09 NOTE — Progress Notes (Signed)
Pre visit review using our clinic review tool, if applicable. No additional management support is needed unless otherwise documented below in the visit note. 

## 2013-10-09 NOTE — Assessment & Plan Note (Signed)
She is doing well on lipitor 

## 2013-10-09 NOTE — Assessment & Plan Note (Signed)
Her blood sugars are well controlled 

## 2013-10-09 NOTE — Patient Instructions (Signed)
Type 2 Diabetes Mellitus, Adult Type 2 diabetes mellitus, often simply referred to as type 2 diabetes, is a long-lasting (chronic) disease. In type 2 diabetes, the pancreas does not make enough insulin (a hormone), the cells are less responsive to the insulin that is made (insulin resistance), or both. Normally, insulin moves sugars from food into the tissue cells. The tissue cells use the sugars for energy. The lack of insulin or the lack of normal response to insulin causes excess sugars to build up in the blood instead of going into the tissue cells. As a result, high blood sugar (hyperglycemia) develops. The effect of high sugar (glucose) levels can cause many complications. Type 2 diabetes was also previously called adult-onset diabetes but it can occur at any age.  RISK FACTORS  A person is predisposed to developing type 2 diabetes if someone in the family has the disease and also has one or more of the following primary risk factors:  Overweight.  An inactive lifestyle.  A history of consistently eating high-calorie foods. Maintaining a normal weight and regular physical activity can reduce the chance of developing type 2 diabetes. SYMPTOMS  A person with type 2 diabetes may not show symptoms initially. The symptoms of type 2 diabetes appear slowly. The symptoms include:  Increased thirst (polydipsia).  Increased urination (polyuria).  Increased urination during the night (nocturia).  Weight loss. This weight loss may be rapid.  Frequent, recurring infections.  Tiredness (fatigue).  Weakness.  Vision changes, such as blurred vision.  Fruity smell to your breath.  Abdominal pain.  Nausea or vomiting.  Cuts or bruises which are slow to heal.  Tingling or numbness in the hands or feet. DIAGNOSIS Type 2 diabetes is frequently not diagnosed until complications of diabetes are present. Type 2 diabetes is diagnosed when symptoms or complications are present and when blood  glucose levels are increased. Your blood glucose level may be checked by one or more of the following blood tests:  A fasting blood glucose test. You will not be allowed to eat for at least 8 hours before a blood sample is taken.  A random blood glucose test. Your blood glucose is checked at any time of the day regardless of when you ate.  A hemoglobin A1c blood glucose test. A hemoglobin A1c test provides information about blood glucose control over the previous 3 months.  An oral glucose tolerance test (OGTT). Your blood glucose is measured after you have not eaten (fasted) for 2 hours and then after you drink a glucose-containing beverage. TREATMENT   You may need to take insulin or diabetes medicine daily to keep blood glucose levels in the desired range.  You will need to match insulin dosing with exercise and healthy food choices. The treatment goal is to maintain the before meal blood sugar (preprandial glucose) level at 70 130 mg/dL. HOME CARE INSTRUCTIONS   Have your hemoglobin A1c level checked twice a year.  Perform daily blood glucose monitoring as directed by your caregiver.  Monitor urine ketones when you are ill and as directed by your caregiver.  Take your diabetes medicine or insulin as directed by your caregiver to maintain your blood glucose levels in the desired range.  Never run out of diabetes medicine or insulin. It is needed every day.  Adjust insulin based on your intake of carbohydrates. Carbohydrates can raise blood glucose levels but need to be included in your diet. Carbohydrates provide vitamins, minerals, and fiber which are an essential part of   a healthy diet. Carbohydrates are found in fruits, vegetables, whole grains, dairy products, legumes, and foods containing added sugars.    Eat healthy foods. Alternate 3 meals with 3 snacks.  Lose weight if overweight.  Carry a medical alert card or wear your medical alert jewelry.  Carry a 15 gram  carbohydrate snack with you at all times to treat low blood glucose (hypoglycemia). Some examples of 15 gram carbohydrate snacks include:  Glucose tablets, 3 or 4   Glucose gel, 15 gram tube  Raisins, 2 tablespoons (24 grams)  Jelly beans, 6  Animal crackers, 8  Regular pop, 4 ounces (120 mL)  Gummy treats, 9  Recognize hypoglycemia. Hypoglycemia occurs with blood glucose levels of 70 mg/dL and below. The risk for hypoglycemia increases when fasting or skipping meals, during or after intense exercise, and during sleep. Hypoglycemia symptoms can include:  Tremors or shakes.  Decreased ability to concentrate.  Sweating.  Increased heart rate.  Headache.  Dry mouth.  Hunger.  Irritability.  Anxiety.  Restless sleep.  Altered speech or coordination.  Confusion.  Treat hypoglycemia promptly. If you are alert and able to safely swallow, follow the 15:15 rule:  Take 15 20 grams of rapid-acting glucose or carbohydrate. Rapid-acting options include glucose gel, glucose tablets, or 4 ounces (120 mL) of fruit juice, regular soda, or low fat milk.  Check your blood glucose level 15 minutes after taking the glucose.  Take 15 20 grams more of glucose if the repeat blood glucose level is still 70 mg/dL or below.  Eat a meal or snack within 1 hour once blood glucose levels return to normal.    Be alert to polyuria and polydipsia which are early signs of hyperglycemia. An early awareness of hyperglycemia allows for prompt treatment. Treat hyperglycemia as directed by your caregiver.  Engage in at least 150 minutes of moderate-intensity physical activity a week, spread over at least 3 days of the week or as directed by your caregiver. In addition, you should engage in resistance exercise at least 2 times a week or as directed by your caregiver.  Adjust your medicine and food intake as needed if you start a new exercise or sport.  Follow your sick day plan at any time you  are unable to eat or drink as usual.  Avoid tobacco use.  Limit alcohol intake to no more than 1 drink per day for nonpregnant women and 2 drinks per day for men. You should drink alcohol only when you are also eating food. Talk with your caregiver whether alcohol is safe for you. Tell your caregiver if you drink alcohol several times a week.  Follow up with your caregiver regularly.  Schedule an eye exam soon after the diagnosis of type 2 diabetes and then annually.  Perform daily skin and foot care. Examine your skin and feet daily for cuts, bruises, redness, nail problems, bleeding, blisters, or sores. A foot exam by a caregiver should be done annually.  Brush your teeth and gums at least twice a day and floss at least once a day. Follow up with your dentist regularly.  Share your diabetes management plan with your workplace or school.  Stay up-to-date with immunizations.  Learn to manage stress.  Obtain ongoing diabetes education and support as needed.  Participate in, or seek rehabilitation as needed to maintain or improve independence and quality of life. Request a physical or occupational therapy referral if you are having foot or hand numbness or difficulties with grooming,   dressing, eating, or physical activity. SEEK MEDICAL CARE IF:   You are unable to eat food or drink fluids for more than 6 hours.  You have nausea and vomiting for more than 6 hours.  Your blood glucose level is over 240 mg/dL.  There is a change in mental status.  You develop an additional serious illness.  You have diarrhea for more than 6 hours.  You have been sick or have had a fever for a couple of days and are not getting better.  You have pain during any physical activity.  SEEK IMMEDIATE MEDICAL CARE IF:  You have difficulty breathing.  You have moderate to large ketone levels. MAKE SURE YOU:  Understand these instructions.  Will watch your condition.  Will get help right away if  you are not doing well or get worse. Document Released: 05/08/2005 Document Revised: 01/31/2012 Document Reviewed: 12/05/2011 [employer] Patient Information 2014 [employer].  

## 2013-10-09 NOTE — Progress Notes (Signed)
   Subjective:    Patient ID: Janice Huang, female    DOB: 08-20-1970, 43 y.o.   MRN: [id number]  Hypertension This is a chronic problem. The current episode started more than 1 year ago. The problem is unchanged. The problem is controlled. Pertinent negatives include no anxiety, chest pain, headaches, malaise/fatigue, neck pain, orthopnea, palpitations, peripheral edema, PND or shortness of breath. There are no associated agents to hypertension. Past treatments include ACE inhibitors and diuretics. The current treatment provides significant improvement. Compliance problems include diet and exercise.       Review of Systems  Constitutional: Negative.  Negative for malaise/fatigue.  HENT: Negative.   Eyes: Negative.   Respiratory: Negative.  Negative for choking, chest tightness, shortness of breath and stridor.   Cardiovascular: Negative.  Negative for chest pain, palpitations, orthopnea, leg swelling and PND.  Gastrointestinal: Negative.  Negative for nausea, vomiting, abdominal pain, diarrhea, constipation and blood in stool.  Endocrine: Negative.  Negative for polydipsia, polyphagia and polyuria.  Genitourinary: Negative.   Musculoskeletal: Negative.  Negative for arthralgias, myalgias and neck pain.  Skin: Negative.   Allergic/Immunologic: Negative.   Neurological: Negative.  Negative for dizziness, syncope, speech difficulty, weakness, light-headedness, numbness and headaches.  Hematological: Negative.  Negative for adenopathy. Does not bruise/bleed easily.  Psychiatric/Behavioral: Negative.        Objective:   Physical Exam  Vitals reviewed. Constitutional: She is oriented to person, place, and time. She appears well-developed and well-nourished. No distress.  HENT:  Head: Normocephalic and atraumatic.  Mouth/Throat: No oropharyngeal exudate.  Eyes: Conjunctivae are normal. Right eye exhibits no discharge. Left eye exhibits no discharge. No scleral icterus.  Neck:  Normal range of motion. Neck supple. No JVD present. No tracheal deviation present. No thyromegaly present.  Cardiovascular: Normal rate, regular rhythm, normal heart sounds and intact distal pulses.  Exam reveals no gallop.   No murmur heard. Pulmonary/Chest: Effort normal and breath sounds normal. No stridor. No respiratory distress. She has no wheezes. She has no rales. She exhibits no tenderness.  Abdominal: Soft. Bowel sounds are normal. She exhibits no distension and no mass. There is no tenderness. There is no rebound and no guarding.  Musculoskeletal: Normal range of motion. She exhibits no edema and no tenderness.  Lymphadenopathy:    She has no cervical adenopathy.  Neurological: She is oriented to person, place, and time.  Skin: Skin is warm and dry. No rash noted. She is not diaphoretic. No erythema. No pallor.  Psychiatric: She has a normal mood and affect. Her behavior is normal. Judgment and thought content normal.          Assessment & Plan:

## 2013-11-11 ENCOUNTER — Telehealth: Payer: Self-pay | Admitting: *Deleted

## 2013-11-11 NOTE — Telephone Encounter (Signed)
Left msg on triage have a sore under her (L) breast its starting to ooze & have an odor to it. Called pt back inform her she need to make an appt to be evaluated. Pt wanted to see someone today inform her no appt available. Can make appt for tomorrow @ 11:15 with Dr. Posey ReaPlotnikov. Pt states she will go to [facility] urgent [facility] because she want to be seen today and she is starting to feel nauseas...Raechel Chute/lmb

## 2014-02-21 ENCOUNTER — Other Ambulatory Visit: Payer: Self-pay | Admitting: Internal Medicine

## 2014-04-13 ENCOUNTER — Ambulatory Visit: Payer: 59 | Admitting: Internal Medicine

## 2014-05-11 ENCOUNTER — Other Ambulatory Visit (INDEPENDENT_AMBULATORY_CARE_PROVIDER_SITE_OTHER): Payer: 59

## 2014-05-11 ENCOUNTER — Encounter: Payer: Self-pay | Admitting: Internal Medicine

## 2014-05-11 ENCOUNTER — Ambulatory Visit (INDEPENDENT_AMBULATORY_CARE_PROVIDER_SITE_OTHER): Payer: 59 | Admitting: Internal Medicine

## 2014-05-11 VITALS — BP 140/90 | HR 70 | Temp 97.8°F | Ht 67.34 in | Wt 341.0 lb

## 2014-05-11 DIAGNOSIS — E669 Obesity, unspecified: Secondary | ICD-10-CM

## 2014-05-11 DIAGNOSIS — E785 Hyperlipidemia, unspecified: Secondary | ICD-10-CM

## 2014-05-11 DIAGNOSIS — E1169 Type 2 diabetes mellitus with other specified complication: Secondary | ICD-10-CM

## 2014-05-11 DIAGNOSIS — E119 Type 2 diabetes mellitus without complications: Secondary | ICD-10-CM

## 2014-05-11 DIAGNOSIS — M1000 Idiopathic gout, unspecified site: Secondary | ICD-10-CM

## 2014-05-11 DIAGNOSIS — I10 Essential (primary) hypertension: Secondary | ICD-10-CM

## 2014-05-11 LAB — CBC WITH DIFFERENTIAL/PLATELET
BASOS PCT: 0.7 % (ref 0.0–3.0)
Basophils Absolute: 0 10*3/uL (ref 0.0–0.1)
EOS ABS: 0.1 10*3/uL (ref 0.0–0.7)
Eosinophils Relative: 2 % (ref 0.0–5.0)
HEMATOCRIT: 39.4 % (ref 36.0–46.0)
HEMOGLOBIN: 12.8 g/dL (ref 12.0–15.0)
Lymphocytes Relative: 42.4 % (ref 12.0–46.0)
Lymphs Abs: 3 10*3/uL (ref 0.7–4.0)
MCHC: 32.6 g/dL (ref 30.0–36.0)
MCV: 87.5 fl (ref 78.0–100.0)
MONO ABS: 0.6 10*3/uL (ref 0.1–1.0)
Monocytes Relative: 8.6 % (ref 3.0–12.0)
NEUTROS ABS: 3.3 10*3/uL (ref 1.4–7.7)
NEUTROS PCT: 46.3 % (ref 43.0–77.0)
Platelets: 246 10*3/uL (ref 150.0–400.0)
RBC: 4.5 Mil/uL (ref 3.87–5.11)
RDW: 13.4 % (ref 11.5–15.5)
WBC: 7.1 10*3/uL (ref 4.0–10.5)

## 2014-05-11 LAB — BASIC METABOLIC PANEL
BUN: 13 mg/dL (ref 6–23)
CO2: 29 meq/L (ref 19–32)
Calcium: 8.9 mg/dL (ref 8.4–10.5)
Chloride: 100 mEq/L (ref 96–112)
Creatinine, Ser: 0.9 mg/dL (ref 0.4–1.2)
GFR: 93.57 mL/min (ref >60.00)
Glucose, Bld: 97 mg/dL (ref 70–99)
POTASSIUM: 3.6 meq/L (ref 3.5–5.1)
SODIUM: 136 meq/L (ref 135–145)

## 2014-05-11 LAB — LIPID PANEL
CHOL/HDL RATIO: 6
Cholesterol: 249 mg/dL — ABNORMAL HIGH (ref 0–200)
HDL: 39.6 mg/dL (ref >39.00)
LDL Cholesterol: 184 mg/dL — ABNORMAL HIGH (ref 0–99)
NonHDL: 209.4
Triglycerides: 128 mg/dL (ref 0.0–149.0)
VLDL: 25.6 mg/dL (ref 0.0–40.0)

## 2014-05-11 LAB — HEMOGLOBIN A1C: Hgb A1c MFr Bld: 6.6 % — ABNORMAL HIGH (ref 4.6–6.5)

## 2014-05-11 LAB — HM DIABETES EYE EXAM

## 2014-05-11 LAB — URIC ACID: Uric Acid, Serum: 10.9 mg/dL — ABNORMAL HIGH (ref 2.4–7.0)

## 2014-05-11 MED ORDER — LISINOPRIL-HYDROCHLOROTHIAZIDE 20-25 MG PO TABS: 1.0000 | ORAL_TABLET | Freq: Every day | ORAL | Status: DC

## 2014-05-11 NOTE — Patient Instructions (Signed)

## 2014-05-11 NOTE — Assessment & Plan Note (Signed)
Her BP is well controlled I will monitor her lytes and renal function 

## 2014-05-11 NOTE — Progress Notes (Signed)
   Subjective:    Patient ID: Janice Huang, female    DOB: 04/07/1971, 43 y.o.   MRN: [id number]  Hypertension This is a chronic problem. The current episode started more than 1 year ago. The problem is unchanged. The problem is controlled. Pertinent negatives include no anxiety, blurred vision, chest pain, headaches, malaise/fatigue, neck pain, orthopnea, palpitations, peripheral edema, PND, shortness of breath or sweats. There are no associated agents to hypertension. Past treatments include ACE inhibitors and diuretics. The current treatment provides moderate improvement. Compliance problems include diet and exercise.       Review of Systems  Constitutional: Negative.  Negative for fever, chills, malaise/fatigue, diaphoresis, appetite change and fatigue.  HENT: Negative.   Eyes: Negative.  Negative for blurred vision.  Respiratory: Negative.  Negative for cough, choking, chest tightness, shortness of breath and stridor.   Cardiovascular: Negative.  Negative for chest pain, palpitations, orthopnea, leg swelling and PND.  Gastrointestinal: Negative.  Negative for nausea, vomiting, abdominal pain, constipation and blood in stool.  Endocrine: Negative.  Negative for polydipsia, polyphagia and polyuria.  Genitourinary: Negative.   Musculoskeletal: Negative.  Negative for back pain, arthralgias and neck pain.  Skin: Negative.  Negative for rash.  Allergic/Immunologic: Negative.   Neurological: Negative.  Negative for dizziness, tremors, light-headedness and headaches.  Hematological: Negative.  Negative for adenopathy. Does not bruise/bleed easily.  Psychiatric/Behavioral: Negative.        Objective:   Physical Exam  Constitutional: She is oriented to person, place, and time. She appears well-developed and well-nourished. No distress.  HENT:  Head: Normocephalic and atraumatic.  Mouth/Throat: Oropharynx is clear and moist. No oropharyngeal exudate.  Eyes: Conjunctivae are  normal. Right eye exhibits no discharge. Left eye exhibits no discharge. No scleral icterus.  Neck: Normal range of motion. Neck supple. No JVD present. No tracheal deviation present. No thyromegaly present.  Cardiovascular: Normal rate, regular rhythm, normal heart sounds and intact distal pulses.  Exam reveals no gallop and no friction rub.   No murmur heard. Pulmonary/Chest: Effort normal and breath sounds normal. No stridor. No respiratory distress. She has no wheezes. She has no rales. She exhibits no tenderness.  Abdominal: Soft. Bowel sounds are normal. She exhibits no distension and no mass. There is no tenderness. There is no rebound and no guarding.  Musculoskeletal: Normal range of motion. She exhibits no edema or tenderness.  Lymphadenopathy:    She has no cervical adenopathy.  Neurological: She is oriented to person, place, and time.  Skin: Skin is warm and dry. No rash noted. She is not diaphoretic. No erythema. No pallor.  Vitals reviewed.    Lab Results  Component Value Date   WBC 9.2 06/18/2013   HGB 12.8 06/18/2013   HCT 37.2 06/18/2013   PLT 261.0 01/17/2013   GLUCOSE 125* 10/09/2013   CHOL 265* 05/23/2013   TRIG 129.0 05/23/2013   HDL 38.80* 05/23/2013   LDLDIRECT 217.2 05/23/2013   ALT 24 05/23/2013   AST 24 05/23/2013   NA 136 10/09/2013   K 3.7 10/09/2013   CL 97 10/09/2013   CREATININE 0.9 10/09/2013   BUN 13 10/09/2013   CO2 30 10/09/2013   TSH 1.19 05/23/2013   HGBA1C 6.5 10/09/2013       Assessment & Plan:

## 2014-05-11 NOTE — Assessment & Plan Note (Signed)
I will recheck her A1C and will treat if needed 

## 2014-05-11 NOTE — Assessment & Plan Note (Signed)
She is doing well on lipitor Will recheck her FLP, may make changes if she has not met her LDL goal but she is having bariatric surgery in one month so will likely wait until after that to make changes 

## 2014-05-11 NOTE — Assessment & Plan Note (Signed)
Will recheck her uric acid and will monitor her renal function She has not had any gout flares in months

## 2014-05-13 ENCOUNTER — Other Ambulatory Visit: Payer: Self-pay | Admitting: Internal Medicine

## 2014-05-13 ENCOUNTER — Encounter: Payer: Self-pay | Admitting: Internal Medicine

## 2014-05-13 DIAGNOSIS — M1000 Idiopathic gout, unspecified site: Secondary | ICD-10-CM

## 2014-05-13 LAB — TSH: TSH: 1.28 u[IU]/mL (ref 0.35–4.50)

## 2014-05-13 MED ORDER — ALLOPURINOL 100 MG PO TABS: 100.0000 mg | ORAL_TABLET | Freq: Every day | ORAL | Status: DC

## 2014-05-21 ENCOUNTER — Telehealth: Payer: Self-pay | Admitting: Internal Medicine

## 2014-05-21 DIAGNOSIS — I10 Essential (primary) hypertension: Secondary | ICD-10-CM

## 2014-05-21 MED ORDER — LISINOPRIL-HYDROCHLOROTHIAZIDE 20-25 MG PO TABS: 1.0000 | ORAL_TABLET | Freq: Every day | ORAL | Status: DC

## 2014-05-21 NOTE — Telephone Encounter (Signed)
Notified pt will resend rx for to [facility]...Raechel Chute/lmb

## 2014-05-21 NOTE — Telephone Encounter (Signed)
Called in said that [facility] did not have her lisinopril-hydrochlorothiazide (PRINZIDE,ZESTORETIC) 20-25 MG per tablet [[id number]][[id number]]  It looked like to me that it was sent on the 21st but she said that [facility] does not have it.

## 2014-06-01 ENCOUNTER — Telehealth: Payer: Self-pay | Admitting: Internal Medicine

## 2014-06-01 MED ORDER — COLCHICINE 0.6 MG PO TABS: 0.6000 mg | ORAL_TABLET | Freq: Two times a day (BID) | ORAL | Status: DC

## 2014-06-01 NOTE — Telephone Encounter (Signed)
Notified pt sent colchicine to [facility]...Raechel Chute/lmb

## 2014-06-01 NOTE — Telephone Encounter (Signed)
Pt states her gout medicine, Colchicine has expired and to call PCP office. Pt has enough through tomorrow then will need more.

## 2014-06-25 ENCOUNTER — Telehealth: Payer: Self-pay | Admitting: Internal Medicine

## 2014-06-25 NOTE — Telephone Encounter (Signed)
Pt is needing letter that says she has tried different diets and medications for atleast 6 months to a year. Pt trying to get approval for surgery   Dr Luretha MurphyMatthew Martin [facility]

## 2014-06-26 NOTE — Telephone Encounter (Signed)
Pt called back in about letter .

## 2014-06-29 ENCOUNTER — Telehealth: Payer: Self-pay | Admitting: Internal Medicine

## 2014-06-29 NOTE — Telephone Encounter (Signed)
Pt called back and stated that the letter we got is good but insurance require these words include int he letter as well, the letter need to state that pt has been follow up on her weight loss by Dr. Yetta BarreJones for at least 6 month or longer. Please call pt [phone].

## 2014-06-29 NOTE — Telephone Encounter (Signed)
Pt called back in about letter .   °

## 2014-06-29 NOTE — Telephone Encounter (Signed)
Per Epic, letter faxed 06/05/2013, unsure if another letter is needed. Called patient at phone number provided, unable to leave VM per voice prompt. A new letter with a new date written and placed upfront.

## 2014-06-30 NOTE — Telephone Encounter (Signed)
Pt has not been seen since 04/2014. Will need appointment and bring along paperwork (etc) with exact information needed for letter. Thanks

## 2014-06-30 NOTE — Telephone Encounter (Signed)
appt is set.  °

## 2014-07-01 ENCOUNTER — Encounter: Payer: Self-pay | Admitting: Internal Medicine

## 2014-07-01 ENCOUNTER — Ambulatory Visit (INDEPENDENT_AMBULATORY_CARE_PROVIDER_SITE_OTHER): Payer: 59 | Admitting: Internal Medicine

## 2014-07-01 VITALS — BP 126/80 | HR 74 | Temp 98.6°F | Resp 16 | Ht 67.34 in | Wt 339.0 lb

## 2014-07-01 DIAGNOSIS — E669 Obesity, unspecified: Secondary | ICD-10-CM

## 2014-07-01 DIAGNOSIS — E1169 Type 2 diabetes mellitus with other specified complication: Secondary | ICD-10-CM

## 2014-07-01 DIAGNOSIS — I10 Essential (primary) hypertension: Secondary | ICD-10-CM

## 2014-07-01 DIAGNOSIS — E119 Type 2 diabetes mellitus without complications: Secondary | ICD-10-CM

## 2014-07-01 DIAGNOSIS — M1000 Idiopathic gout, unspecified site: Secondary | ICD-10-CM

## 2014-07-01 MED ORDER — ALLOPURINOL 100 MG PO TABS: 100.0000 mg | ORAL_TABLET | Freq: Every day | ORAL | Status: DC

## 2014-07-01 NOTE — Patient Instructions (Signed)

## 2014-07-01 NOTE — Telephone Encounter (Signed)
Patient came in for office visit and supplied forms needed, I have completed all forms which have now been faxed back to the bariatric coordinator. Patient notified

## 2014-07-02 NOTE — Assessment & Plan Note (Signed)
She will start allopurinol

## 2014-07-02 NOTE — Assessment & Plan Note (Signed)
Her BP is well controlled 

## 2014-07-02 NOTE — Assessment & Plan Note (Signed)
Her blood sugars are adequately well controlled 

## 2014-07-02 NOTE — Progress Notes (Signed)
   Subjective:    Patient ID: Janice Huang, female    DOB: 05/11/1971, 44 y.o.   MRN: [id number]  HPI  She returns for f/up, she has not started allopurinol yet - something about a pharmacy issue but she has also not had any pain or swelling. She feels well today. [employer] has denied her bariatric surgery and she asks that we do more documentation for her to get then to cover her surgery (scheduled for 07/20/14.)  Review of Systems  Constitutional: Negative.  Negative for fever, chills, diaphoresis, appetite change and fatigue.  HENT: Negative.   Eyes: Negative.   Respiratory: Negative.  Negative for cough, choking, chest tightness, shortness of breath and stridor.   Cardiovascular: Negative.  Negative for chest pain, palpitations and leg swelling.  Gastrointestinal: Negative.  Negative for nausea, vomiting, abdominal pain, diarrhea, constipation and blood in stool.  Endocrine: Negative.   Genitourinary: Negative.  Negative for difficulty urinating.  Musculoskeletal: Negative.  Negative for myalgias, back pain, joint swelling and arthralgias.  Skin: Negative.  Negative for rash.  Allergic/Immunologic: Negative.   Neurological: Negative.  Negative for dizziness, tremors, weakness, light-headedness and numbness.  Hematological: Negative.  Negative for adenopathy. Does not bruise/bleed easily.  Psychiatric/Behavioral: Negative.        Objective:   Physical Exam  Constitutional: She is oriented to person, place, and time. She appears well-developed and well-nourished. No distress.  HENT:  Head: Normocephalic and atraumatic.  Mouth/Throat: Oropharynx is clear and moist. No oropharyngeal exudate.  Eyes: Conjunctivae are normal. Right eye exhibits no discharge. Left eye exhibits no discharge. No scleral icterus.  Neck: Normal range of motion. Neck supple. No JVD present. No tracheal deviation present. No thyromegaly present.  Cardiovascular: Normal rate, regular rhythm, normal heart  sounds and intact distal pulses.  Exam reveals no gallop and no friction rub.   No murmur heard. Pulmonary/Chest: Effort normal and breath sounds normal. No stridor. No respiratory distress. She has no wheezes. She has no rales. She exhibits no tenderness.  Abdominal: Soft. Bowel sounds are normal. She exhibits no distension and no mass. There is no tenderness. There is no rebound and no guarding.  Musculoskeletal: Normal range of motion. She exhibits no edema or tenderness.  Lymphadenopathy:    She has no cervical adenopathy.  Neurological: She is oriented to person, place, and time.  Skin: Skin is warm and dry. No rash noted. She is not diaphoretic. No erythema. No pallor.  Vitals reviewed.    Lab Results  Component Value Date   WBC 7.1 05/11/2014   HGB 12.8 05/11/2014   HCT 39.4 05/11/2014   PLT 246.0 05/11/2014   GLUCOSE 97 05/11/2014   CHOL 249* 05/11/2014   TRIG 128.0 05/11/2014   HDL 39.60 05/11/2014   LDLDIRECT 217.2 05/23/2013   LDLCALC 184* 05/11/2014   ALT 24 05/23/2013   AST 24 05/23/2013   NA 136 05/11/2014   K 3.6 05/11/2014   CL 100 05/11/2014   CREATININE 0.9 05/11/2014   BUN 13 05/11/2014   CO2 29 05/11/2014   TSH 1.28 05/11/2014   HGBA1C 6.6* 05/11/2014       Assessment & Plan:

## 2014-07-10 IMAGING — CR DG CHEST 2V
2 series · 2 of 2 positions shown · non-contrast
Comparison: None.

CLINICAL DATA: Morbid obesity. Pre-op evaluation for bariatric
surgery.

EXAM:
CHEST  2 VIEW

[view not recorded (1 of 2)]
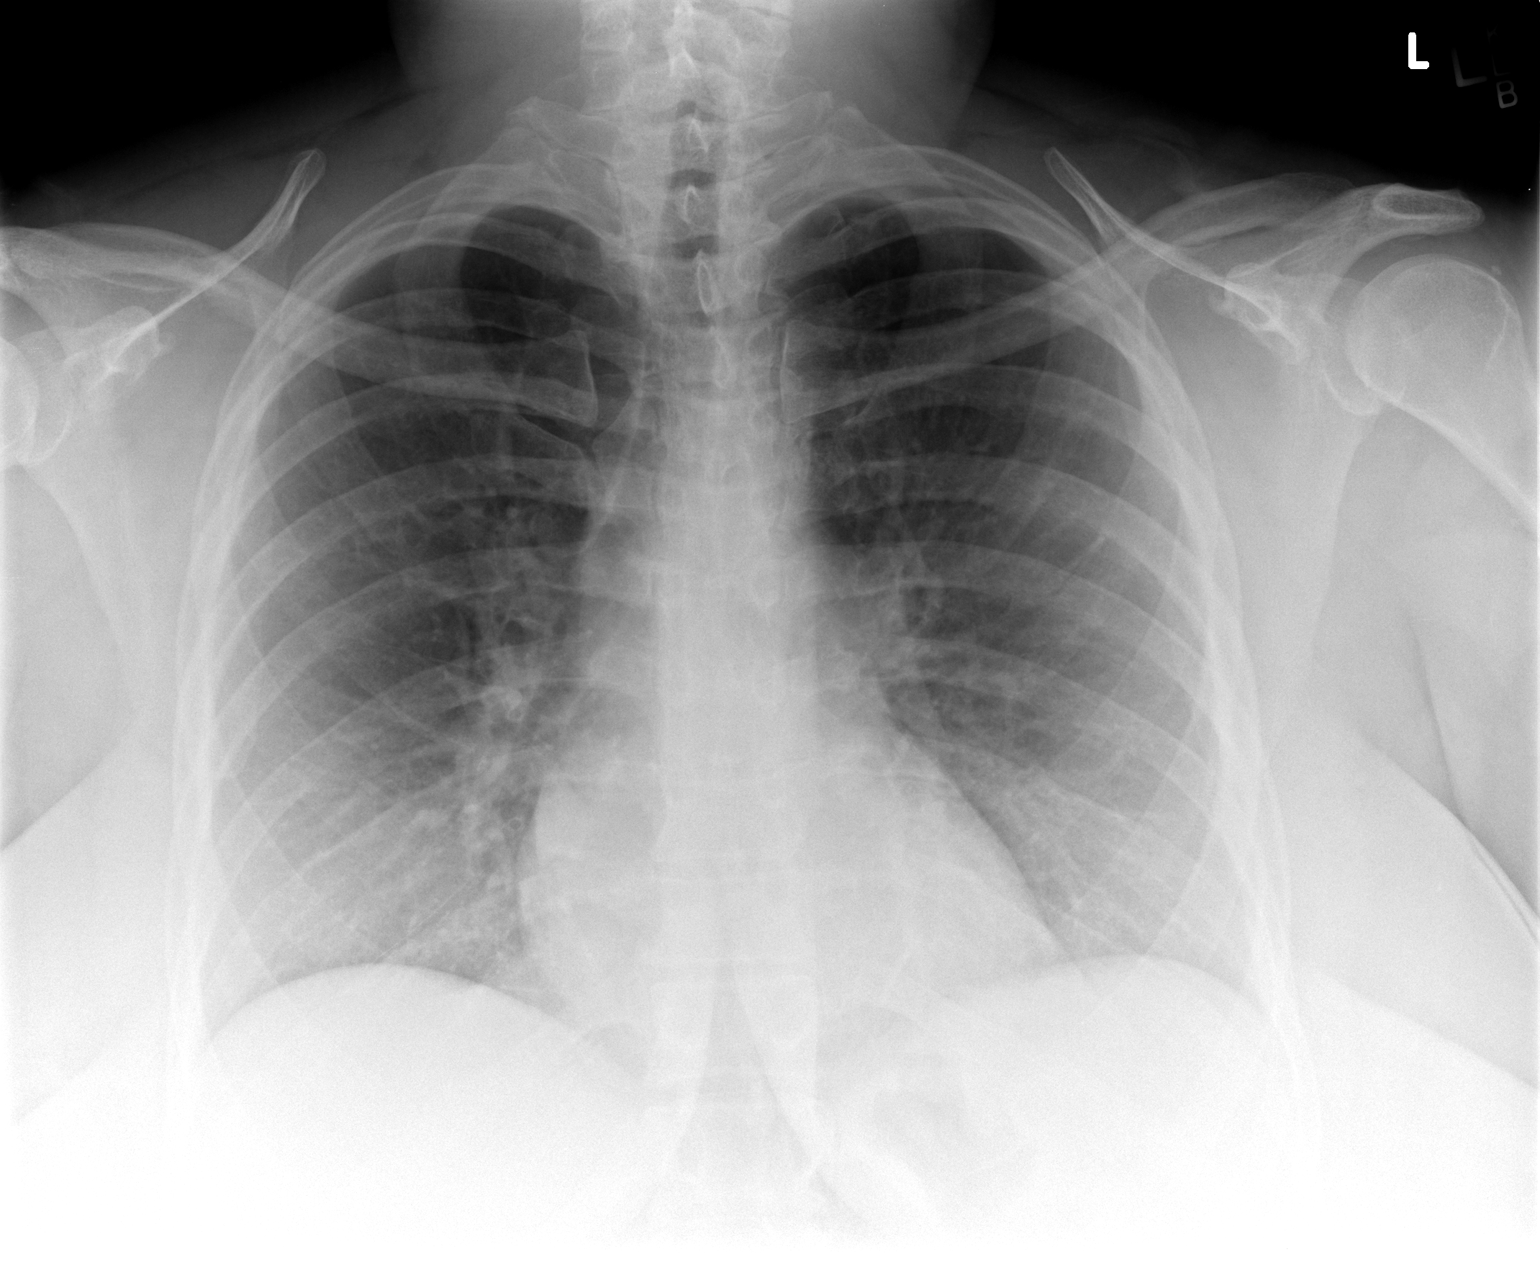

[view not recorded (2 of 2)]
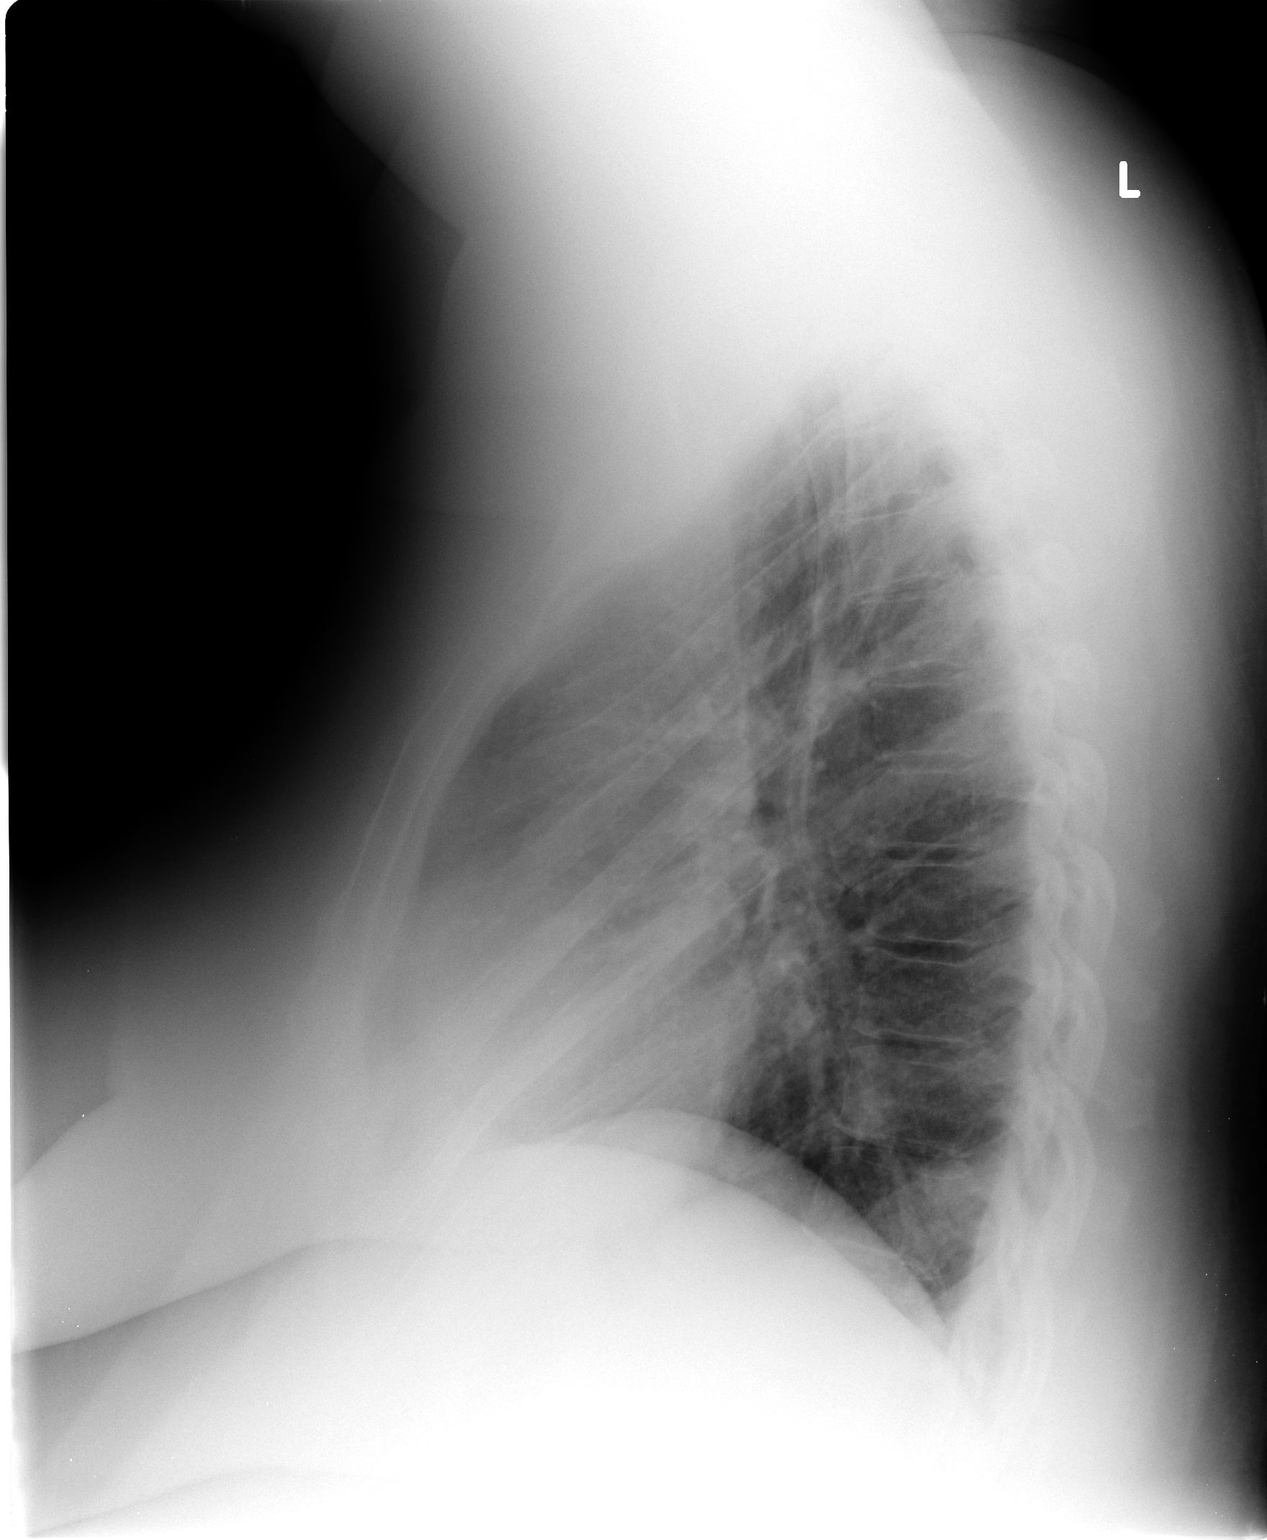

[2 of 2 positions shown; findings below may reference images not displayed]

FINDINGS: The heart size and mediastinal contours are within normal limits.
Both lungs are clear. The visualized skeletal structures are
unremarkable.
IMPRESSION: No active cardiopulmonary disease.

## 2014-08-09 ENCOUNTER — Other Ambulatory Visit: Payer: Self-pay | Admitting: Internal Medicine

## 2014-08-10 ENCOUNTER — Encounter: Payer: 59 | Attending: Surgery

## 2014-08-10 DIAGNOSIS — Z713 Dietary counseling and surveillance: Secondary | ICD-10-CM | POA: Insufficient documentation

## 2014-08-10 DIAGNOSIS — Z6841 Body Mass Index (BMI) 40.0 and over, adult: Secondary | ICD-10-CM | POA: Diagnosis not present

## 2014-08-10 NOTE — Progress Notes (Signed)
  Pre-Operative Nutrition Class:  Appt start time: 830   End time:  930.  Patient was seen on 08/10/14 for Pre-Operative Bariatric Surgery Education at the [facility].   Surgery date: 09/21/14 Surgery type: Gastric sleeve Start weight at [facility]: 346 lbs on 07/11/14 Weight today: 341 lbs  TANITA  BODY COMP RESULTS  08/10/14   BMI (kg/m^2) 52.6   Fat Mass (lbs) 204   Fat Free Mass (lbs) 137   Total Body Water (lbs) 100.5   Samples given per MNT protocol. Patient educated on appropriate usage: Premier protein shake (vanilla - qty 1) Lot #: [id number] Exp: 04/2015  Unjury protein powder (unflavored - qty 1) Lot #: [id number] Exp: 06/2015  PB2 (qty 1) Lot #: [id number] Exp: 12/2014  Bariatric Advantage Calcium Citrate (caramel - qty 1) Lot #: [id number] Exp: 09/2014  Bariatric Advantage Iron Chewy Bite (chocolate raspberry - qty 1) Lot #: [id number] Exp: 12/2014   The following the learning objectives were met by the patient during this course:  Identify Pre-Op Dietary Goals and will begin 2 weeks pre-operatively  Identify appropriate sources of fluids and proteins   State protein recommendations and appropriate sources pre and post-operatively  Identify Post-Operative Dietary Goals and will follow for 2 weeks post-operatively  Identify appropriate multivitamin and calcium sources  Describe the need for physical activity post-operatively and will follow MD recommendations  State when to call healthcare provider regarding medication questions or post-operative complications  Handouts given during class include:  Pre-Op Bariatric Surgery Diet Handout  Protein Shake Handout  Post-Op Bariatric Surgery Nutrition Handout  BELT Program Information Flyer  Support Group Information Flyer  [facility] Bariatric Supplements Price List  Follow-Up Plan: Patient will follow-up at [facility] 2 weeks post operatively for diet advancement per MD.

## 2014-08-21 ENCOUNTER — Telehealth: Payer: Self-pay | Admitting: Internal Medicine

## 2014-08-21 DIAGNOSIS — Z1231 Encounter for screening mammogram for malignant neoplasm of breast: Secondary | ICD-10-CM

## 2014-08-21 NOTE — Telephone Encounter (Signed)
Pt called back and stated that she need this appt before 09/21/14 for her surgery. Please help

## 2014-08-21 NOTE — Telephone Encounter (Signed)
LVM for pt to call back.   RE: referral has been entered per PCP for mammogram.

## 2014-08-21 NOTE — Telephone Encounter (Signed)
done

## 2014-08-21 NOTE — Telephone Encounter (Signed)
Patient needs a referral for her annual mammogram

## 2014-08-24 ENCOUNTER — Other Ambulatory Visit: Payer: Self-pay | Admitting: Internal Medicine

## 2014-08-24 DIAGNOSIS — Z1231 Encounter for screening mammogram for malignant neoplasm of breast: Secondary | ICD-10-CM

## 2014-08-27 NOTE — Telephone Encounter (Signed)
Left message for patient to call office. She can [facility] directly to schedule her screening mammogram. They will call her to schedule, but she may want to call herself if this is time sensitive.

## 2014-09-07 ENCOUNTER — Ambulatory Visit: Payer: 59 | Admitting: Internal Medicine

## 2014-09-08 NOTE — Progress Notes (Signed)
Please put orders in Epic surgery 09-21-14 pre op 09-14-14 Thanks

## 2014-09-09 ENCOUNTER — Ambulatory Visit (INDEPENDENT_AMBULATORY_CARE_PROVIDER_SITE_OTHER): Payer: 59 | Admitting: Internal Medicine

## 2014-09-09 VITALS — BP 118/72 | HR 90 | Temp 98.5°F | Resp 16 | Ht 67.5 in | Wt 336.0 lb

## 2014-09-09 DIAGNOSIS — E119 Type 2 diabetes mellitus without complications: Secondary | ICD-10-CM | POA: Diagnosis not present

## 2014-09-09 DIAGNOSIS — I10 Essential (primary) hypertension: Secondary | ICD-10-CM

## 2014-09-09 DIAGNOSIS — E669 Obesity, unspecified: Secondary | ICD-10-CM

## 2014-09-09 DIAGNOSIS — E1169 Type 2 diabetes mellitus with other specified complication: Secondary | ICD-10-CM

## 2014-09-09 NOTE — Progress Notes (Signed)
Pre visit review using our clinic review tool, if applicable. No additional management support is needed unless otherwise documented below in the visit note. 

## 2014-09-09 NOTE — Progress Notes (Signed)
   Subjective:    Patient ID: Janice RamusJacquetta B Litzinger, female    DOB: Oct 24, 1970, 44 y.o.   MRN: [id number]  Hypertension This is a chronic problem. The current episode started more than 1 year ago. The problem has been gradually improving since onset. The problem is controlled. Pertinent negatives include no anxiety, blurred vision, chest pain, headaches, malaise/fatigue, neck pain, orthopnea, palpitations, peripheral edema, PND, shortness of breath or sweats. There are no associated agents to hypertension. Risk factors for coronary artery disease include obesity. Past treatments include ACE inhibitors and diuretics. The current treatment provides significant improvement. Compliance problems include diet.       Review of Systems  Constitutional: Negative.  Negative for fever, chills, malaise/fatigue, diaphoresis, appetite change and fatigue.  HENT: Negative.   Eyes: Negative.  Negative for blurred vision.  Respiratory: Negative.  Negative for cough, choking, chest tightness, shortness of breath and stridor.   Cardiovascular: Negative.  Negative for chest pain, palpitations, orthopnea, leg swelling and PND.  Gastrointestinal: Negative.  Negative for nausea, vomiting, abdominal pain, diarrhea, constipation and blood in stool.  Endocrine: Negative.  Negative for polydipsia, polyphagia and polyuria.  Genitourinary: Negative.   Musculoskeletal: Negative.  Negative for neck pain.  Skin: Negative.   Allergic/Immunologic: Negative.   Neurological: Negative.  Negative for dizziness and headaches.  Hematological: Negative.  Negative for adenopathy. Does not bruise/bleed easily.  Psychiatric/Behavioral: Negative.        Objective:   Physical Exam  Constitutional: She is oriented to person, place, and time. She appears well-developed and well-nourished. No distress.  HENT:  Head: Normocephalic and atraumatic.  Mouth/Throat: Oropharynx is clear and moist. No oropharyngeal exudate.  Eyes:  Conjunctivae are normal. Right eye exhibits no discharge. Left eye exhibits no discharge. No scleral icterus.  Neck: Normal range of motion. Neck supple. No JVD present. No tracheal deviation present. No thyromegaly present.  Cardiovascular: Normal rate, regular rhythm, normal heart sounds and intact distal pulses.  Exam reveals no gallop and no friction rub.   No murmur heard. Pulmonary/Chest: Effort normal and breath sounds normal. No stridor. No respiratory distress. She has no wheezes. She has no rales. She exhibits no tenderness.  Abdominal: Soft. Bowel sounds are normal. She exhibits no distension and no mass. There is no tenderness. There is no rebound and no guarding.  Musculoskeletal: Normal range of motion. She exhibits no edema or tenderness.  Lymphadenopathy:    She has no cervical adenopathy.  Neurological: She is oriented to person, place, and time.  Skin: Skin is warm and dry. No rash noted. She is not diaphoretic. No erythema. No pallor.  Vitals reviewed.    Lab Results  Component Value Date   WBC 7.1 05/11/2014   HGB 12.8 05/11/2014   HCT 39.4 05/11/2014   PLT 246.0 05/11/2014   GLUCOSE 97 05/11/2014   CHOL 249* 05/11/2014   TRIG 128.0 05/11/2014   HDL 39.60 05/11/2014   LDLDIRECT 217.2 05/23/2013   LDLCALC 184* 05/11/2014   ALT 24 05/23/2013   AST 24 05/23/2013   NA 136 05/11/2014   K 3.6 05/11/2014   CL 100 05/11/2014   CREATININE 0.9 05/11/2014   BUN 13 05/11/2014   CO2 29 05/11/2014   TSH 1.28 05/11/2014   HGBA1C 6.6* 05/11/2014       Assessment & Plan:

## 2014-09-10 ENCOUNTER — Telehealth: Payer: Self-pay | Admitting: Internal Medicine

## 2014-09-12 ENCOUNTER — Encounter: Payer: Self-pay | Admitting: Internal Medicine

## 2014-09-12 NOTE — Assessment & Plan Note (Signed)
Her BP is well controlled She is cleared for bariatric surgery

## 2014-09-12 NOTE — Assessment & Plan Note (Signed)
Blood sugars are well controlled This may resolve after surgery

## 2014-09-14 ENCOUNTER — Encounter (HOSPITAL_COMMUNITY)
Admission: RE | Admit: 2014-09-14 | Discharge: 2014-09-14 | Disposition: A | Discharge status: Home / Self Care | Payer: 59 | Source: Ambulatory Visit | Attending: Surgery | Admitting: Surgery

## 2014-09-14 ENCOUNTER — Telehealth: Payer: Self-pay | Admitting: Internal Medicine

## 2014-09-14 ENCOUNTER — Encounter (HOSPITAL_COMMUNITY): Payer: Self-pay

## 2014-09-14 ENCOUNTER — Other Ambulatory Visit: Payer: Self-pay | Admitting: Internal Medicine

## 2014-09-14 DIAGNOSIS — G4730 Sleep apnea, unspecified: Secondary | ICD-10-CM

## 2014-09-14 DIAGNOSIS — Z01810 Encounter for preprocedural cardiovascular examination: Secondary | ICD-10-CM | POA: Diagnosis not present

## 2014-09-14 DIAGNOSIS — I10 Essential (primary) hypertension: Secondary | ICD-10-CM | POA: Insufficient documentation

## 2014-09-14 DIAGNOSIS — Z01812 Encounter for preprocedural laboratory examination: Secondary | ICD-10-CM | POA: Diagnosis not present

## 2014-09-14 HISTORY — DX: Headache, unspecified: R51.9

## 2014-09-14 HISTORY — DX: Personal history of other diseases of the digestive system: Z87.19

## 2014-09-14 HISTORY — DX: Gout, unspecified: M10.9

## 2014-09-14 HISTORY — DX: Headache: R51

## 2014-09-14 LAB — CBC
HEMATOCRIT: 38.7 % (ref 36.0–46.0)
Hemoglobin: 12.8 g/dL (ref 12.0–15.0)
MCH: 29 pg (ref 26.0–34.0)
MCHC: 33.1 g/dL (ref 30.0–36.0)
MCV: 87.6 fL (ref 78.0–100.0)
Platelets: 287 10*3/uL (ref 150–400)
RBC: 4.42 MIL/uL (ref 3.87–5.11)
RDW: 12.8 % (ref 11.5–15.5)
WBC: 6.2 10*3/uL (ref 4.0–10.5)

## 2014-09-14 LAB — BASIC METABOLIC PANEL
Anion gap: 8 (ref 5–15)
BUN: 19 mg/dL (ref 6–23)
CO2: 28 mmol/L (ref 19–32)
CREATININE: 0.84 mg/dL (ref 0.50–1.10)
Calcium: 9.3 mg/dL (ref 8.4–10.5)
Chloride: 101 mmol/L (ref 96–112)
GFR calc Af Amer: >90 mL/min (ref >90)
GFR, EST NON AFRICAN AMERICAN: 83 mL/min — AB (ref >90)
GLUCOSE: 117 mg/dL — AB (ref 70–99)
Potassium: 3.9 mmol/L (ref 3.5–5.1)
SODIUM: 137 mmol/L (ref 135–145)

## 2014-09-14 NOTE — Telephone Encounter (Signed)
Patient is calling on referral placed today for pulmonary. States that she knew nothing about this referral. She also states that this was not spoken of during her 09/09/2014 visit. The referral is dated 09/14/2014. Is this correct? If so, where did the referral originate from so that I may advise the patient. Thank you in advance!

## 2014-09-14 NOTE — Telephone Encounter (Signed)
Called left vm advising  °

## 2014-09-14 NOTE — Patient Instructions (Addendum)
Janice RamusJacquetta B Huang  09/14/2014   Your procedure is scheduled on: 09-21-2014  [facility]  [facility] and follow signs to               Short Stay Center at 530 AM.  Call this number if you have problems the morning of surgery [phone]   Remember:  Do not eat food or drink liquids :After Midnight.     Take these medicines the morning of surgery with A SIP OF WATER :colcrys                                You may not have any metal on your body including hair pins and              piercings  Do not wear jewelry, make-up, lotions, powders or perfumes.             Do not wear nail polish.  Do not shave  48 hours prior to surgery.              Men may shave face and neck.   Do not bring valuables to the hospital. [facility] IS NOT             RESPONSIBLE   FOR VALUABLES.  Contacts, dentures or bridgework may not be worn into surgery.  Leave suitcase in the car. After surgery it may be brought to your room.     Patients discharged the day of surgery will not be allowed to drive home.  Name and phone number of your driver:  Special Instructions: N/A              Please read over the following fact sheets you were given: _____________________________________________________________________             [facility] Pre[facility] Before surgery, you can play an important role.  Because skin is not sterile, your skin needs to be as free of germs as possible.  You can reduce the number of germs on your skin by washing with CHG (chlorahexidine gluconate) soap before surgery.  CHG is an antiseptic cleaner which kills germs and bonds with the skin to continue killing germs even after washing. Please DO NOT use if you have an allergy to CHG or antibacterial soaps.  If your skin becomes reddened/irritated stop using the CHG and inform your nurse when you arrive at Short Stay. Do not shave (including legs and underarms) for at least 48 hours prior  to the first CHG shower.  You may shave your face/neck. Please follow these instructions carefully:  1.  Shower with CHG Soap the night before surgery and the  morning of Surgery.  2.  If you choose to wash your hair, wash your hair first as usual with your  normal  shampoo.  3.  After you shampoo, rinse your hair and body thoroughly to remove the  shampoo.                           4.  Use CHG as you would any other liquid soap.  You can apply chg directly  to the skin and wash                       Gently  with a scrungie or clean washcloth.  5.  Apply the CHG Soap to your body ONLY FROM THE NECK DOWN.   Do not use on face/ open                           Wound or open sores. Avoid contact with eyes, ears mouth and genitals (private parts).                       Wash face,  Genitals (private parts) with your normal soap.             6.  Wash thoroughly, paying special attention to the area where your surgery  will be performed.  7.  Thoroughly rinse your body with warm water from the neck down.  8.  DO NOT shower/wash with your normal soap after using and rinsing off  the CHG Soap.                9.  Pat yourself dry with a clean towel.            10.  Wear clean pajamas.            11.  Place clean sheets on your bed the night of your first shower and do not  sleep with pets. Day of Surgery : Do not apply any lotions/deodorants the morning of surgery.  Please wear clean clothes to the hospital/surgery center.  FAILURE TO FOLLOW THESE INSTRUCTIONS MAY RESULT IN THE CANCELLATION OF YOUR SURGERY PATIENT SIGNATURE_________________________________  NURSE SIGNATURE__________________________________  ________________________________________________________________________

## 2014-09-14 NOTE — Progress Notes (Signed)
   09/14/14 0817  OBSTRUCTIVE SLEEP APNEA  Have you ever been diagnosed with sleep apnea through a sleep study? No  Do you snore loudly (loud enough to be heard through closed doors)?  1  Do you often feel tired, fatigued, or sleepy during the daytime? 0  Has anyone observed you stop breathing during your sleep? 0  Do you have, or are you being treated for high blood pressure? 1  BMI more than 35 kg/m2? 1  Age over 44 years old? 0  Neck circumference greater than 40 cm/16 inches? 1 (18 inches)  Gender: 0  Obstructive Sleep Apnea Score 4

## 2014-09-14 NOTE — Telephone Encounter (Signed)
I was notified today that her pre-op testing was + for sleep apnea, hence the referral was generated

## 2014-09-18 ENCOUNTER — Ambulatory Visit (HOSPITAL_COMMUNITY)
Admission: RE | Admit: 2014-09-18 | Discharge: 2014-09-18 | Disposition: A | Discharge status: Home / Self Care | Payer: 59 | Source: Ambulatory Visit | Attending: Internal Medicine | Admitting: Internal Medicine

## 2014-09-18 DIAGNOSIS — Z1231 Encounter for screening mammogram for malignant neoplasm of breast: Secondary | ICD-10-CM | POA: Insufficient documentation

## 2014-09-20 ENCOUNTER — Ambulatory Visit: Payer: Self-pay | Admitting: Surgery

## 2014-09-20 NOTE — H&P (Signed)
Janice Huang 08/26/2014 4:28 PM Location: [facility] Patient #: [id number] DOB: 12/23/1970 Married / Language: English / Race: Black or African American Female  History of Present Illness (Janice Botkins B. Corliss Coggeshall MD; 08/26/2014 5:01 PM) Patient words: pre-op sleeve gastrectomy  Preoperative visit for Janice Huang who is slated to undergo a sleeve gastrectomy on May 2. Although her upper GI since she had a small hiatal hernia she has no symptoms of GERD. She is excited about the surgery. She had no further questions. She just received some uncertainties about the health status of her father and was anxious about that. We gave her prescription for pain medications.  The patient is a 43 year old female    Other Problems (Janice Huang, CMA; 08/26/2014 4:29 PM) High blood pressure Hypercholesterolemia  Past Surgical History (Janice Huang, CMA; 08/26/2014 4:29 PM) Cesarean Section - Multiple  Diagnostic Studies History (Janice Huang, CMA; 08/26/2014 4:29 PM) Colonoscopy never Mammogram within last year  Allergies (Janice Huang, CMA; 08/26/2014 4:29 PM) No Known Drug Allergies04/10/2014  Medication History (Janice Huang, CMA; 08/26/2014 4:29 PM) Colcrys (0.6MG Tablet, Oral) Active. Medications Reconciled  Social History (Janice Huang, CMA; 08/26/2014 4:29 PM) Alcohol use Occasional alcohol use. Caffeine use Carbonated beverages. No drug use Tobacco use Never smoker.  Family History (Janice Huang, CMA; 08/26/2014 4:29 PM) Family history unknown First Degree Relatives  Pregnancy / Birth History (Janice Huang, CMA; 08/26/2014 4:29 PM) Age at menarche 12 years. Gravida 4 Irregular periods Maternal age 15-20 Para 3  Review of Systems (Janice Huang CMA; 08/26/2014 4:29 PM) General Not Present- Appetite Loss, Chills, Fatigue, Fever, Night Sweats, Weight Gain and Weight Loss. Skin Not Present- Change in Wart/Mole, Dryness, Hives, Jaundice, New Lesions, Non-Healing Wounds,  Rash and Ulcer. HEENT Not Present- Earache, Hearing Loss, Hoarseness, Nose Bleed, Oral Ulcers, Ringing in the Ears, Seasonal Allergies, Sinus Pain, Sore Throat, Visual Disturbances, Wears glasses/contact lenses and Yellow Eyes. Respiratory Not Present- Bloody sputum, Chronic Cough, Difficulty Breathing, Snoring and Wheezing. Breast Not Present- Breast Mass, Breast Pain, Nipple Discharge and Skin Changes. Cardiovascular Not Present- Chest Pain, Difficulty Breathing Lying Down, Leg Cramps, Palpitations, Rapid Heart Rate, Shortness of Breath and Swelling of Extremities. Gastrointestinal Not Present- Abdominal Pain, Bloating, Bloody Stool, Change in Bowel Habits, Chronic diarrhea, Constipation, Difficulty Swallowing, Excessive gas, Gets full quickly at meals, Hemorrhoids, Indigestion, Nausea, Rectal Pain and Vomiting. Female Genitourinary Not Present- Frequency, Nocturia, Painful Urination, Pelvic Pain and Urgency. Musculoskeletal Not Present- Back Pain, Joint Pain, Joint Stiffness, Muscle Pain, Muscle Weakness and Swelling of Extremities. Neurological Not Present- Decreased Memory, Fainting, Headaches, Numbness, Seizures, Tingling, Tremor, Trouble walking and Weakness. Psychiatric Not Present- Anxiety, Bipolar, Change in Sleep Pattern, Depression, Fearful and Frequent crying. Endocrine Not Present- Cold Intolerance, Excessive Hunger, Hair Changes, Heat Intolerance, Hot flashes and New Diabetes. Hematology Not Present- Easy Bruising, Excessive bleeding, Gland problems, HIV and Persistent Infections.   Vitals (Janice Huang CMA; 08/26/2014 4:30 PM) 08/26/2014 4:29 PM Weight: 345 lb Height: 67in Body Surface Area: 2.72 m Body Mass Index: 54.03 kg/m Temp.: 98.3F(Oral)  Pulse: 89 (Regular)  Resp.: 17 (Unlabored)  BP: 140/80 (Sitting, Left Arm, Standard) HEENT unremarkable Chest clear Heart SR without murmurs    Physical Exam (Janice Muhlbauer B. Tammra Pressman MD; 08/26/2014 5:01  PM) Cardiovascular Note: SR without murmurs     Assessment & Plan (Janice Pascual B. Zenon Leaf MD; 08/26/2014 5:02 PM) MORBID OBESITY (278.01  E66.01) Impression: sleeve gastrectomy on May 2  

## 2014-09-21 ENCOUNTER — Inpatient Hospital Stay (HOSPITAL_COMMUNITY): Payer: 59 | Admitting: Anesthesiology

## 2014-09-21 ENCOUNTER — Inpatient Hospital Stay (HOSPITAL_COMMUNITY)
Admission: RE | Admit: 2014-09-21 | Discharge: 2014-09-23 | DRG: 621 | Disposition: A | Discharge status: Home / Self Care | Payer: 59 | Source: Ambulatory Visit | Attending: Surgery | Admitting: Surgery

## 2014-09-21 ENCOUNTER — Encounter (HOSPITAL_COMMUNITY)
Admission: RE | Disposition: A | Discharge status: Home / Self Care | Payer: Self-pay | Source: Ambulatory Visit | Attending: Surgery

## 2014-09-21 ENCOUNTER — Encounter (HOSPITAL_COMMUNITY): Payer: Self-pay | Admitting: Anesthesiology

## 2014-09-21 DIAGNOSIS — Z6841 Body Mass Index (BMI) 40.0 and over, adult: Secondary | ICD-10-CM

## 2014-09-21 DIAGNOSIS — K660 Peritoneal adhesions (postprocedural) (postinfection): Secondary | ICD-10-CM | POA: Diagnosis present

## 2014-09-21 DIAGNOSIS — I10 Essential (primary) hypertension: Secondary | ICD-10-CM | POA: Diagnosis present

## 2014-09-21 DIAGNOSIS — N926 Irregular menstruation, unspecified: Secondary | ICD-10-CM | POA: Diagnosis present

## 2014-09-21 DIAGNOSIS — Z9884 Bariatric surgery status: Secondary | ICD-10-CM

## 2014-09-21 DIAGNOSIS — E1169 Type 2 diabetes mellitus with other specified complication: Secondary | ICD-10-CM | POA: Diagnosis present

## 2014-09-21 DIAGNOSIS — K449 Diaphragmatic hernia without obstruction or gangrene: Secondary | ICD-10-CM | POA: Diagnosis present

## 2014-09-21 DIAGNOSIS — E785 Hyperlipidemia, unspecified: Secondary | ICD-10-CM | POA: Diagnosis present

## 2014-09-21 HISTORY — PX: LAPAROSCOPIC GASTRIC SLEEVE RESECTION: SHX5895

## 2014-09-21 LAB — CBC
HCT: 38 % (ref 36.0–46.0)
Hemoglobin: 12.5 g/dL (ref 12.0–15.0)
MCH: 28.5 pg (ref 26.0–34.0)
MCHC: 32.9 g/dL (ref 30.0–36.0)
MCV: 86.8 fL (ref 78.0–100.0)
PLATELETS: 289 10*3/uL (ref 150–400)
RBC: 4.38 MIL/uL (ref 3.87–5.11)
RDW: 12.8 % (ref 11.5–15.5)
WBC: 8.7 10*3/uL (ref 4.0–10.5)

## 2014-09-21 LAB — CREATININE, SERUM
CREATININE: 1.1 mg/dL — AB (ref 0.44–1.00)
GFR calc Af Amer: >60 mL/min (ref >60)

## 2014-09-21 LAB — GLUCOSE, CAPILLARY
GLUCOSE-CAPILLARY: 132 mg/dL — AB (ref 70–99)
GLUCOSE-CAPILLARY: 172 mg/dL — AB (ref 70–99)
Glucose-Capillary: 184 mg/dL — ABNORMAL HIGH (ref 70–99)

## 2014-09-21 LAB — HEMOGLOBIN AND HEMATOCRIT, BLOOD

## 2014-09-21 SURGERY — GASTRECTOMY, SLEEVE, LAPAROSCOPIC
Anesthesia: General | Site: Abdomen

## 2014-09-21 MED ORDER — MORPHINE SULFATE 2 MG/ML IJ SOLN
2.0000 mg | INTRAMUSCULAR | Status: DC | PRN
  Administered 2014-09-21: 2 mg via INTRAVENOUS
  Administered 2014-09-21 – 2014-09-22 (×3): 4 mg via INTRAVENOUS
  Filled 2014-09-21 (×3): qty 2
  Filled 2014-09-21: qty 1

## 2014-09-21 MED ORDER — GLYCOPYRROLATE 0.2 MG/ML IJ SOLN
INTRAMUSCULAR | Status: AC
  Filled 2014-09-21: qty 3

## 2014-09-21 MED ORDER — CISATRACURIUM BESYLATE (PF) 10 MG/5ML IV SOLN
INTRAVENOUS | Status: DC | PRN
  Administered 2014-09-21: 2 mg via INTRAVENOUS
  Administered 2014-09-21: 10 mg via INTRAVENOUS
  Administered 2014-09-21 (×2): 4 mg via INTRAVENOUS
  Administered 2014-09-21: 2 mg via INTRAVENOUS

## 2014-09-21 MED ORDER — CHLORHEXIDINE GLUCONATE CLOTH 2 % EX PADS: 6.0000 | MEDICATED_PAD | Freq: Once | CUTANEOUS | Status: DC

## 2014-09-21 MED ORDER — LIDOCAINE HCL (CARDIAC) 20 MG/ML IV SOLN
INTRAVENOUS | Status: AC
  Filled 2014-09-21: qty 5

## 2014-09-21 MED ORDER — NEOSTIGMINE METHYLSULFATE 10 MG/10ML IV SOLN
INTRAVENOUS | Status: AC
  Filled 2014-09-21: qty 1

## 2014-09-21 MED ORDER — ONDANSETRON HCL 4 MG/2ML IJ SOLN
INTRAMUSCULAR | Status: DC | PRN
  Administered 2014-09-21: 4 mg via INTRAVENOUS

## 2014-09-21 MED ORDER — TISSEEL VH 10 ML EX KIT
PACK | CUTANEOUS | Status: AC
  Filled 2014-09-21: qty 1

## 2014-09-21 MED ORDER — BUPIVACAINE LIPOSOME 1.3 % IJ SUSP
20.0000 mL | Freq: Once | INTRAMUSCULAR | Status: AC
  Administered 2014-09-21: 20 mL
  Filled 2014-09-21: qty 20

## 2014-09-21 MED ORDER — DEXTROSE 5 % IV SOLN
INTRAVENOUS | Status: AC
  Filled 2014-09-21: qty 2

## 2014-09-21 MED ORDER — ONDANSETRON HCL 4 MG/2ML IJ SOLN
4.0000 mg | INTRAMUSCULAR | Status: DC | PRN
  Administered 2014-09-21 (×3): 4 mg via INTRAVENOUS
  Filled 2014-09-21 (×4): qty 2

## 2014-09-21 MED ORDER — SUFENTANIL CITRATE 50 MCG/ML IV SOLN
INTRAVENOUS | Status: DC | PRN
  Administered 2014-09-21 (×2): 10 ug via INTRAVENOUS
  Administered 2014-09-21: 20 ug via INTRAVENOUS
  Administered 2014-09-21: 10 ug via INTRAVENOUS

## 2014-09-21 MED ORDER — PHENOL 1.4 % MT LIQD
1.0000 | OROMUCOSAL | Status: DC | PRN
  Administered 2014-09-21: 1 via OROMUCOSAL
  Filled 2014-09-21: qty 177

## 2014-09-21 MED ORDER — SODIUM CHLORIDE 0.9 % IJ SOLN
INTRAMUSCULAR | Status: AC
  Filled 2014-09-21: qty 10

## 2014-09-21 MED ORDER — UNJURY CHOCOLATE CLASSIC POWDER
2.0000 [oz_av] | Freq: Four times a day (QID) | ORAL | Status: DC
  Administered 2014-09-23: 2 [oz_av] via ORAL

## 2014-09-21 MED ORDER — ONDANSETRON HCL 4 MG/2ML IJ SOLN
INTRAMUSCULAR | Status: AC
Start: 2014-09-21 — End: 2014-09-21
  Filled 2014-09-21: qty 2

## 2014-09-21 MED ORDER — UNJURY CHICKEN SOUP POWDER: 2.0000 [oz_av] | Freq: Four times a day (QID) | ORAL | Status: DC

## 2014-09-21 MED ORDER — 0.9 % SODIUM CHLORIDE (POUR BTL) OPTIME
TOPICAL | Status: DC | PRN
  Administered 2014-09-21: 1000 mL

## 2014-09-21 MED ORDER — HYDROMORPHONE HCL 1 MG/ML IJ SOLN
INTRAMUSCULAR | Status: DC | PRN
  Administered 2014-09-21 (×3): .4 mg via INTRAVENOUS

## 2014-09-21 MED ORDER — ONDANSETRON HCL 4 MG/2ML IJ SOLN: 4.0000 mg | Freq: Once | INTRAMUSCULAR | Status: DC | PRN

## 2014-09-21 MED ORDER — INSULIN ASPART 100 UNIT/ML ~~LOC~~ SOLN
0.0000 [IU] | SUBCUTANEOUS | Status: DC
  Administered 2014-09-21 (×2): 4 [IU] via SUBCUTANEOUS
  Administered 2014-09-22 (×3): 3 [IU] via SUBCUTANEOUS

## 2014-09-21 MED ORDER — DEXAMETHASONE SODIUM PHOSPHATE 10 MG/ML IJ SOLN
INTRAMUSCULAR | Status: AC
  Filled 2014-09-21: qty 1

## 2014-09-21 MED ORDER — EPHEDRINE SULFATE 50 MG/ML IJ SOLN
INTRAMUSCULAR | Status: AC
  Filled 2014-09-21: qty 1

## 2014-09-21 MED ORDER — ACETAMINOPHEN 160 MG/5ML PO SOLN: 650.0000 mg | ORAL | Status: DC | PRN

## 2014-09-21 MED ORDER — FUROSEMIDE 10 MG/ML IJ SOLN
20.0000 mg | Freq: Once | INTRAMUSCULAR | Status: AC
  Administered 2014-09-21: 20 mg via INTRAVENOUS
  Filled 2014-09-21: qty 2

## 2014-09-21 MED ORDER — PANTOPRAZOLE SODIUM 40 MG IV SOLR
40.0000 mg | Freq: Every day | INTRAVENOUS | Status: DC
  Administered 2014-09-21 – 2014-09-22 (×2): 40 mg via INTRAVENOUS
  Filled 2014-09-21 (×3): qty 40

## 2014-09-21 MED ORDER — EPHEDRINE SULFATE 50 MG/ML IJ SOLN
INTRAMUSCULAR | Status: DC | PRN
  Administered 2014-09-21: 10 mg via INTRAVENOUS

## 2014-09-21 MED ORDER — CISATRACURIUM BESYLATE 20 MG/10ML IV SOLN
INTRAVENOUS | Status: AC
  Filled 2014-09-21: qty 10

## 2014-09-21 MED ORDER — SUCCINYLCHOLINE CHLORIDE 20 MG/ML IJ SOLN
INTRAMUSCULAR | Status: DC | PRN
  Administered 2014-09-21: 100 mg via INTRAVENOUS

## 2014-09-21 MED ORDER — LACTATED RINGERS IR SOLN
Status: DC | PRN
  Administered 2014-09-21: 1000 mL

## 2014-09-21 MED ORDER — DEXAMETHASONE SODIUM PHOSPHATE 10 MG/ML IJ SOLN
INTRAMUSCULAR | Status: DC | PRN
  Administered 2014-09-21: 10 mg via INTRAVENOUS

## 2014-09-21 MED ORDER — HEPARIN SODIUM (PORCINE) 5000 UNIT/ML IJ SOLN
5000.0000 [IU] | INTRAMUSCULAR | Status: AC
  Administered 2014-09-21: 5000 [IU] via SUBCUTANEOUS
  Filled 2014-09-21: qty 1

## 2014-09-21 MED ORDER — LACTATED RINGERS IV SOLN: INTRAVENOUS | Status: DC

## 2014-09-21 MED ORDER — SUFENTANIL CITRATE 50 MCG/ML IV SOLN
INTRAVENOUS | Status: AC
  Filled 2014-09-21: qty 1

## 2014-09-21 MED ORDER — SODIUM CHLORIDE 0.9 % IJ SOLN
INTRAMUSCULAR | Status: AC
Start: 2014-09-21 — End: 2014-09-21
  Filled 2014-09-21: qty 20

## 2014-09-21 MED ORDER — CETYLPYRIDINIUM CHLORIDE 0.05 % MT LIQD
7.0000 mL | Freq: Two times a day (BID) | OROMUCOSAL | Status: DC
  Administered 2014-09-21 – 2014-09-22 (×3): 7 mL via OROMUCOSAL

## 2014-09-21 MED ORDER — NEOSTIGMINE METHYLSULFATE 10 MG/10ML IV SOLN
INTRAVENOUS | Status: DC | PRN
  Administered 2014-09-21: 4 mg via INTRAVENOUS

## 2014-09-21 MED ORDER — MIDAZOLAM HCL 5 MG/5ML IJ SOLN
INTRAMUSCULAR | Status: DC | PRN
  Administered 2014-09-21: 2 mg via INTRAVENOUS

## 2014-09-21 MED ORDER — PROPOFOL 10 MG/ML IV BOLUS
INTRAVENOUS | Status: AC
  Filled 2014-09-21: qty 20

## 2014-09-21 MED ORDER — PROPOFOL 10 MG/ML IV BOLUS
INTRAVENOUS | Status: DC | PRN
  Administered 2014-09-21: 200 mg via INTRAVENOUS

## 2014-09-21 MED ORDER — HEPARIN SODIUM (PORCINE) 5000 UNIT/ML IJ SOLN
5000.0000 [IU] | Freq: Three times a day (TID) | INTRAMUSCULAR | Status: DC
  Administered 2014-09-21 – 2014-09-23 (×5): 5000 [IU] via SUBCUTANEOUS
  Filled 2014-09-21 (×9): qty 1

## 2014-09-21 MED ORDER — UNJURY VANILLA POWDER: 2.0000 [oz_av] | Freq: Four times a day (QID) | ORAL | Status: DC

## 2014-09-21 MED ORDER — LACTATED RINGERS IV SOLN
INTRAVENOUS | Status: DC | PRN
  Administered 2014-09-21 (×2): via INTRAVENOUS

## 2014-09-21 MED ORDER — KCL IN DEXTROSE-NACL 20-5-0.45 MEQ/L-%-% IV SOLN
INTRAVENOUS | Status: DC
  Administered 2014-09-21: 12:00:00 via INTRAVENOUS
  Administered 2014-09-21 – 2014-09-23 (×2): 100 mL/h via INTRAVENOUS
  Filled 2014-09-21 (×6): qty 1000

## 2014-09-21 MED ORDER — LIDOCAINE HCL (CARDIAC) 20 MG/ML IV SOLN
INTRAVENOUS | Status: DC | PRN
  Administered 2014-09-21: 100 mg via INTRAVENOUS

## 2014-09-21 MED ORDER — CHLORHEXIDINE GLUCONATE 0.12 % MT SOLN
15.0000 mL | Freq: Two times a day (BID) | OROMUCOSAL | Status: DC
  Administered 2014-09-21 – 2014-09-22 (×3): 15 mL via OROMUCOSAL
  Filled 2014-09-21 (×5): qty 15

## 2014-09-21 MED ORDER — MIDAZOLAM HCL 2 MG/2ML IJ SOLN
INTRAMUSCULAR | Status: AC
  Filled 2014-09-21: qty 2

## 2014-09-21 MED ORDER — DEXTROSE 5 % IV SOLN
2.0000 g | INTRAVENOUS | Status: AC
  Administered 2014-09-21 (×2): 2 g via INTRAVENOUS

## 2014-09-21 MED ORDER — GLYCOPYRROLATE 0.2 MG/ML IJ SOLN
INTRAMUSCULAR | Status: DC | PRN
  Administered 2014-09-21: 0.6 mg via INTRAVENOUS

## 2014-09-21 MED ORDER — ACETAMINOPHEN 160 MG/5ML PO SOLN: 325.0000 mg | ORAL | Status: DC | PRN

## 2014-09-21 MED ORDER — HYDROMORPHONE HCL 2 MG/ML IJ SOLN
INTRAMUSCULAR | Status: AC
  Filled 2014-09-21: qty 1

## 2014-09-21 MED ORDER — OXYCODONE HCL 5 MG/5ML PO SOLN: 5.0000 mg | ORAL | Status: DC | PRN

## 2014-09-21 SURGICAL SUPPLY — 68 items
APL SRG 32X5 SNPLK LF DISP (MISCELLANEOUS)
APPLICATOR COTTON TIP 6IN STRL (MISCELLANEOUS) IMPLANT
APPLIER CLIP 5 13 M/L LIGAMAX5 (MISCELLANEOUS)
APPLIER CLIP ROT 10 11.4 M/L (STAPLE)
APPLIER CLIP ROT 13.4 12 LRG (CLIP)
APR CLP LRG 13.4X12 ROT 20 MLT (CLIP)
APR CLP MED LRG 11.4X10 (STAPLE)
APR CLP MED LRG 5 ANG JAW (MISCELLANEOUS)
BLADE SURG 15 STRL LF DISP TIS (BLADE) ×1 IMPLANT
BLADE SURG 15 STRL SS (BLADE) ×3
CABLE HIGH FREQUENCY MONO STRZ (ELECTRODE) ×2 IMPLANT
CLIP APPLIE 5 13 M/L LIGAMAX5 (MISCELLANEOUS) IMPLANT
CLIP APPLIE ROT 10 11.4 M/L (STAPLE) IMPLANT
CLIP APPLIE ROT 13.4 12 LRG (CLIP) IMPLANT
DEVICE SUT QUICK LOAD TK 5 (STAPLE) ×1 IMPLANT
DEVICE SUT TI-KNOT TK 5X26 (MISCELLANEOUS) IMPLANT
DEVICE SUTURE ENDOST 10MM (ENDOMECHANICALS) IMPLANT
DEVICE TI KNOT TK5 (MISCELLANEOUS)
DEVICE TROCAR PUNCTURE CLOSURE (ENDOMECHANICALS) ×3 IMPLANT
DISSECTOR BLUNT TIP ENDO 5MM (MISCELLANEOUS) ×3 IMPLANT
DRAPE CAMERA CLOSED 9X96 (DRAPES) ×3 IMPLANT
ELECT REM PT RETURN 9FT ADLT (ELECTROSURGICAL) ×3
ELECTRODE REM PT RTRN 9FT ADLT (ELECTROSURGICAL) ×1 IMPLANT
GAUZE SPONGE 4X4 12PLY STRL (GAUZE/BANDAGES/DRESSINGS) IMPLANT
GLOVE BIOGEL M 8.0 STRL (GLOVE) ×3 IMPLANT
GOWN STRL REUS W/TWL XL LVL3 (GOWN DISPOSABLE) ×12 IMPLANT
HANDLE STAPLE EGIA 4 XL (STAPLE) ×3 IMPLANT
HOVERMATT SINGLE USE (MISCELLANEOUS) ×3 IMPLANT
KIT BASIN OR (CUSTOM PROCEDURE TRAY) ×3 IMPLANT
LIQUID BAND (GAUZE/BANDAGES/DRESSINGS) IMPLANT
NDL SPNL 22GX3.5 QUINCKE BK (NEEDLE) ×1 IMPLANT
NEEDLE SPNL 22GX3.5 QUINCKE BK (NEEDLE) ×3 IMPLANT
PACK UNIVERSAL I (CUSTOM PROCEDURE TRAY) ×3 IMPLANT
PEN SKIN MARKING BROAD (MISCELLANEOUS) ×3 IMPLANT
QUICK LOAD TK 5 (STAPLE) ×1
RELOAD TRI 45 ART MED THCK BLK (STAPLE) ×5 IMPLANT
RELOAD TRI 45 ART MED THCK PUR (STAPLE) IMPLANT
RELOAD TRI 60 ART MED THCK BLK (STAPLE) ×7 IMPLANT
RELOAD TRI 60 ART MED THCK PUR (STAPLE) ×3 IMPLANT
SCISSORS LAP 5X45 EPIX DISP (ENDOMECHANICALS) ×2 IMPLANT
SCRUB PCMX 4 OZ (MISCELLANEOUS) ×3 IMPLANT
SEALANT SURGICAL APPL DUAL CAN (MISCELLANEOUS) IMPLANT
SET IRRIG TUBING LAPAROSCOPIC (IRRIGATION / IRRIGATOR) ×3 IMPLANT
SHEARS CURVED HARMONIC AC 45CM (MISCELLANEOUS) ×3 IMPLANT
SLEEVE ADV FIXATION 5X100MM (TROCAR) ×6 IMPLANT
SLEEVE GASTRECTOMY 36FR VISIGI (MISCELLANEOUS) ×3 IMPLANT
SOLUTION ANTI FOG 6CC (MISCELLANEOUS) ×3 IMPLANT
SPONGE LAP 18X18 X RAY DECT (DISPOSABLE) ×3 IMPLANT
STAPLER VISISTAT 35W (STAPLE) ×3 IMPLANT
SUT SURGIDAC NAB ES-9 0 48 120 (SUTURE) ×2 IMPLANT
SUT V-LOC BARB 180 2/0GR6 GS22 (SUTURE) ×3
SUT VIC AB 4-0 SH 18 (SUTURE) ×3 IMPLANT
SUT VICRYL 0 TIES 12 18 (SUTURE) ×3 IMPLANT
SUTURE V-LC BRB 180 2/0GR6GS22 (SUTURE) IMPLANT
SYR 20CC LL (SYRINGE) ×3 IMPLANT
SYR 50ML LL SCALE MARK (SYRINGE) ×3 IMPLANT
TOWEL OR 17X26 10 PK STRL BLUE (TOWEL DISPOSABLE) ×6 IMPLANT
TOWEL OR NON WOVEN STRL DISP B (DISPOSABLE) ×3 IMPLANT
TRAY FOLEY W/METER SILVER 14FR (SET/KITS/TRAYS/PACK) IMPLANT
TROCAR ADV FIXATION 12X100MM (TROCAR) ×3 IMPLANT
TROCAR ADV FIXATION 5X100MM (TROCAR) ×3 IMPLANT
TROCAR BLADELESS 15MM (ENDOMECHANICALS) ×3 IMPLANT
TROCAR BLADELESS OPT 5 100 (ENDOMECHANICALS) ×3 IMPLANT
TUBE CALIBRATION LAPBAND (TUBING) IMPLANT
TUBING CONNECTING 10 (TUBING) ×2 IMPLANT
TUBING CONNECTING 10' (TUBING) ×1
TUBING ENDO SMARTCAP (MISCELLANEOUS) ×3 IMPLANT
TUBING FILTER THERMOFLATOR (ELECTROSURGICAL) ×3 IMPLANT

## 2014-09-21 NOTE — Transfer of Care (Signed)
Immediate Anesthesia Transfer of Care Note  Patient: Janice Huang  Procedure(s) Performed: Procedure(s): LAPAROSCOPIC GASTRIC SLEEVE RESECTION WITH UPPER ENDOSCOPY (N/A)  Patient Location: PACU  Anesthesia Type:General  Level of Consciousness: awake  Airway & Oxygen Therapy: Patient Spontanous Breathing and Patient connected to face mask oxygen  Post-op Assessment: Report given to RN and Post -op Vital signs reviewed and stable  Post vital signs: Reviewed and stable  Last Vitals:  Filed Vitals:   09/21/14 0537  BP: 138/74  Pulse: 77  Temp: 37 C  Resp: 18    Complications: No apparent anesthesia complications

## 2014-09-21 NOTE — Anesthesia Preprocedure Evaluation (Signed)
Anesthesia Evaluation  Patient identified by MRN, date of birth, ID band Patient awake    Reviewed: Allergy & Precautions, NPO status , Patient's Chart, lab work & pertinent test results  Airway Mallampati: II       Dental   Pulmonary    Pulmonary exam normal       Cardiovascular hypertension, Rhythm:Regular Rate:Normal     Neuro/Psych  Headaches,    GI/Hepatic hiatal hernia,   Endo/Other  diabetes, Type obesity  Renal/GU      Musculoskeletal   Abdominal   Peds  Hematology   Anesthesia Other Findings   Reproductive/Obstetrics                             Anesthesia Physical Anesthesia Plan  ASA: III  Anesthesia Plan: General   Post-op Pain Management:    Induction: Intravenous  Airway Management Planned: Oral ETT  Additional Equipment:   Intra-op Plan:   Post-operative Plan: Extubation in OR  Informed Consent: I have reviewed the patients History and Physical, chart, labs and discussed the procedure including the risks, benefits and alternatives for the proposed anesthesia with the patient or authorized representative who has indicated his/her understanding and acceptance.     Plan Discussed with: CRNA, Anesthesiologist and Surgeon  Anesthesia Plan Comments:         Anesthesia Quick Evaluation

## 2014-09-21 NOTE — Brief Op Note (Signed)
09/21/2014  10:33 AM  PATIENT:  Ellamae SiaJacquetta B XXXMcMurray  44 y.o. female  PRE-OPERATIVE DIAGNOSIS:  MORBID OBESITY  POST-OPERATIVE DIAGNOSIS:  MORBID OBESITY  PROCEDURE:  Procedure(s): LAPAROSCOPIC GASTRIC SLEEVE RESECTION WITH UPPER ENDOSCOPY (N/A)  SURGEON:  Surgeon(s) and Role:    * Luretha MurphyMatthew Chace Bisch, MD - Primary    * Gaynelle AduEric Wilson, MD - Assisting  PHYSICIAN ASSISTANT:   ASSISTANTS: Gaynelle AduEric Wilson, MD, FACS   ANESTHESIA:   general  EBL:  Total I/O In: 1500 [I.V.:1500] Out: 50 [Blood:50]  BLOOD ADMINISTERED:none  DRAINS: none   LOCAL MEDICATIONS USED:  BUPIVICAINE   SPECIMEN:  Source of Specimen:  stomach  DISPOSITION OF SPECIMEN:  PATHOLOGY  COUNTS:  YES  TOURNIQUET:  * No tourniquets in log *  DICTATION: .[id number]   PLAN OF CARE: Admit to inpatient   PATIENT DISPOSITION:  PACU - hemodynamically stable.   Delay start of Pharmacological VTE agent (>24hrs) due to surgical blood loss or risk of bleeding: no

## 2014-09-21 NOTE — Progress Notes (Signed)
Patient alert and oriented,Op day.  Provided support and encouragement.  Encouraged pulmonary toilet and ambulation.  All questions answered.  Will continue to monitor.

## 2014-09-21 NOTE — Interval H&P Note (Signed)
History and Physical Interval Note:  09/21/2014 7:06 AM  Janice Huang  has presented today for surgery, with the diagnosis of MORBID OBESITY  The various methods of treatment have been discussed with the patient and family. After consideration of risks, benefits and other options for treatment, the patient has consented to  Procedure(s): LAPAROSCOPIC GASTRIC SLEEVE RESECTION (N/A) as a surgical intervention .  The patient's history has been reviewed, patient examined, no change in status, stable for surgery.  I have reviewed the patient's chart and labs.  Questions were answered to the patient's satisfaction.     Janice Huang   

## 2014-09-21 NOTE — H&P (View-Only) (Signed)
Janice Huang 08/26/2014 4:28 PM Location: [facility] Patient #: [id number] DOB: [birth year] Married / Language: English / Race: Black or African American Female  History of Present Illness Janice Huang(Janice Saline B. Daphine DeutscherMartin MD; 08/26/2014 5:01 PM) Patient words: pre-op sleeve gastrectomy  Preoperative visit for Janice Huang who is slated to undergo a sleeve gastrectomy on May 2. Although her upper GI since she had a small hiatal hernia she has no symptoms of GERD. She is excited about the surgery. She had no further questions. She just received some uncertainties about the health status of her father and was anxious about that. We gave her prescription for pain medications.  The patient is a 44 year old female    Other Problems Janice Huang(Janice Huang, CMA; 08/26/2014 4:29 PM) High blood pressure Hypercholesterolemia  Past Surgical History Janice Huang(Janice Huang, CMA; 08/26/2014 4:29 PM) Cesarean Section - Multiple  Diagnostic Studies History Janice Huang(Janice Huang, CMA; 08/26/2014 4:29 PM) Colonoscopy never Mammogram within last year  Allergies Janice Huang(Janice Huang, CMA; 08/26/2014 4:29 PM) No Known Drug Allergies04/10/2014  Medication History Janice Huang(Janice Huang, CMA; 08/26/2014 4:29 PM) Colcrys (0.6MG  Tablet, Oral) Active. Medications Reconciled  Social History Janice Huang(Jan[place]; 08/26/2014 4:29 PM) Alcohol use Occasional alcohol use. Caffeine use Carbonated beverages. No drug use Tobacco use Never smoker.  Family History Janice Huang(Janice [place], [place]; 08/26/2014 4:29 PM) Family history unknown First Degree Relatives  Pregnancy / Birth History Janice Huang(Janice [place], [place]; 08/26/2014 4:29 PM) Age at menarche 12 years. Gravida 4 Irregular periods Maternal age 44-20 Para 3  Review of Systems Janice Huang(Janice Huang CMA; 08/26/2014 4:29 PM) General Not Present- Appetite Loss, Chills, Fatigue, Fever, Night Sweats, Weight Gain and Weight Loss. Skin Not Present- Change in Wart/Mole, Dryness, Hives, Jaundice, New Lesions, Non-Healing Wounds,  Rash and Ulcer. HEENT Not Present- Earache, Hearing Loss, Hoarseness, Nose Bleed, Oral Ulcers, Ringing in the Ears, Seasonal Allergies, Sinus Pain, Sore Throat, Visual Disturbances, Wears glasses/contact lenses and Yellow Eyes. Respiratory Not Present- Bloody sputum, Chronic Cough, Difficulty Breathing, Snoring and Wheezing. Breast Not Present- Breast Mass, Breast Pain, Nipple Discharge and Skin Changes. Cardiovascular Not Present- Chest Pain, Difficulty Breathing Lying Down, Leg Cramps, Palpitations, Rapid Heart Rate, Shortness of Breath and Swelling of Extremities. Gastrointestinal Not Present- Abdominal Pain, Bloating, Bloody Stool, Change in Bowel Habits, Chronic diarrhea, Constipation, Difficulty Swallowing, Excessive gas, Gets full quickly at meals, Hemorrhoids, Indigestion, Nausea, Rectal Pain and Vomiting. Female Genitourinary Not Present- Frequency, Nocturia, Painful Urination, Pelvic Pain and Urgency. Musculoskeletal Not Present- Back Pain, Joint Pain, Joint Stiffness, Muscle Pain, Muscle Weakness and Swelling of Extremities. Neurological Not Present- Decreased Memory, Fainting, Headaches, Numbness, Seizures, Tingling, Tremor, Trouble walking and Weakness. Psychiatric Not Present- Anxiety, Bipolar, Change in Sleep Pattern, Depression, Fearful and Frequent crying. Endocrine Not Present- Cold Intolerance, Excessive Hunger, Hair Changes, Heat Intolerance, Hot flashes and New Diabetes. Hematology Not Present- Easy Bruising, Excessive bleeding, Gland problems, HIV and Persistent Infections.   Vitals Janice Huang(Janice Huang CMA; 08/26/2014 4:30 PM) 08/26/2014 4:29 PM Weight: 345 lb Height: 67in Body Surface Area: 2.72 m Body Mass Index: 54.03 kg/m Temp.: 98.62F(Oral)  Pulse: 89 (Regular)  Resp.: 17 (Unlabored)  BP: 140/80 (Sitting, Left Arm, Standard) HEENT unremarkable Chest clear Heart SR without murmurs    Physical Exam (Janice Huang B. Daphine DeutscherMartin MD; 08/26/2014 5:01  PM) Cardiovascular Note: SR without murmurs     Assessment & Plan Janice Huang(Janice Sherman B. Daphine DeutscherMartin MD; 08/26/2014 5:02 PM) MORBID OBESITY (278.01  E66.01) Impression: sleeve gastrectomy on May 2

## 2014-09-21 NOTE — Interval H&P Note (Signed)
History and Physical Interval Note:  09/21/2014 7:06 AM  Janice Huang  has presented today for surgery, with the diagnosis of MORBID OBESITY  The various methods of treatment have been discussed with the patient and family. After consideration of risks, benefits and other options for treatment, the patient has consented to  Procedure(s): LAPAROSCOPIC GASTRIC SLEEVE RESECTION (N/A) as a surgical intervention .  The patient's history has been reviewed, patient examined, no change in status, stable for surgery.  I have reviewed the patient's chart and labs.  Questions were answered to the patient's satisfaction.     Azarian Starace B

## 2014-09-21 NOTE — Anesthesia Postprocedure Evaluation (Signed)
  Anesthesia Post-op Note  Patient: Janice Huang  Procedure(s) Performed: Procedure(s): LAPAROSCOPIC GASTRIC SLEEVE RESECTION WITH UPPER ENDOSCOPY (N/A)  Patient Location: PACU  Anesthesia Type:General  Level of Consciousness: awake, oriented, sedated and patient cooperative  Airway and Oxygen Therapy: Patient Spontanous Breathing  Post-op Pain: mild  Post-op Assessment: Post-op Vital signs reviewed, Patient's Cardiovascular Status Stable, Respiratory Function Stable, Patent Airway, No signs of Nausea or vomiting and Pain level controlled  Post-op Vital Signs: stable  Last Vitals:  Filed Vitals:   09/21/14 1122  BP: 144/75  Pulse: 75  Temp: 36.7 C  Resp: 16    Complications: No apparent anesthesia complications

## 2014-09-21 NOTE — Anesthesia Procedure Notes (Signed)
Procedure Name: Intubation Date/Time: 09/21/2014 7:25 AM Performed by: Leroy LibmanEARDON, Jonika Critz L Patient Re-evaluated:Patient Re-evaluated prior to inductionOxygen Delivery Method: Circle system utilized Preoxygenation: Pre-oxygenation with 100% oxygen Intubation Type: IV induction Ventilation: Mask ventilation without difficulty and Oral airway inserted - appropriate to patient size Laryngoscope Size: Hyacinth MeekerMiller and 2 Grade View: Grade I Tube type: Oral Tube size: 7.5 mm Number of attempts: 1 Airway Equipment and Method: Stylet Placement Confirmation: ETT inserted through vocal cords under direct vision,  breath sounds checked- equal and bilateral and positive ETCO2 Secured at: 22 cm Tube secured with: Tape Dental Injury: Teeth and Oropharynx as per pre-operative assessment

## 2014-09-22 ENCOUNTER — Encounter (HOSPITAL_COMMUNITY): Payer: Self-pay | Admitting: Surgery

## 2014-09-22 ENCOUNTER — Inpatient Hospital Stay (HOSPITAL_COMMUNITY): Payer: 59

## 2014-09-22 LAB — HM MAMMOGRAPHY: HM Mammogram: NORMAL

## 2014-09-22 LAB — CBC WITH DIFFERENTIAL/PLATELET
BASOS ABS: 0 10*3/uL (ref 0.0–0.1)
Basophils Relative: 0 % (ref 0–1)
Eosinophils Absolute: 0 10*3/uL (ref 0.0–0.7)
Eosinophils Relative: 0 % (ref 0–5)
HEMATOCRIT: 35.9 % — AB (ref 36.0–46.0)
Hemoglobin: 12.1 g/dL (ref 12.0–15.0)
LYMPHS PCT: 9 % — AB (ref 12–46)
Lymphs Abs: 1.4 10*3/uL (ref 0.7–4.0)
MCH: 29.3 pg (ref 26.0–34.0)
MCHC: 33.7 g/dL (ref 30.0–36.0)
MCV: 86.9 fL (ref 78.0–100.0)
MONOS PCT: 11 % (ref 3–12)
Monocytes Absolute: 1.8 10*3/uL — ABNORMAL HIGH (ref 0.1–1.0)
NEUTROS ABS: 12.9 10*3/uL — AB (ref 1.7–7.7)
NEUTROS PCT: 80 % — AB (ref 43–77)
Platelets: 276 10*3/uL (ref 150–400)
RBC: 4.13 MIL/uL (ref 3.87–5.11)
RDW: 12.9 % (ref 11.5–15.5)
WBC: 16.1 10*3/uL — ABNORMAL HIGH (ref 4.0–10.5)

## 2014-09-22 LAB — GLUCOSE, CAPILLARY
GLUCOSE-CAPILLARY: 130 mg/dL — AB (ref 70–99)
GLUCOSE-CAPILLARY: 113 mg/dL — AB (ref 70–99)
GLUCOSE-CAPILLARY: 128 mg/dL — AB (ref 70–99)
Glucose-Capillary: 108 mg/dL — ABNORMAL HIGH (ref 70–99)
Glucose-Capillary: 113 mg/dL — ABNORMAL HIGH (ref 70–99)
Glucose-Capillary: 113 mg/dL — ABNORMAL HIGH (ref 70–99)

## 2014-09-22 LAB — HEMOGLOBIN AND HEMATOCRIT, BLOOD
HCT: 36.8 % (ref 36.0–46.0)
HEMOGLOBIN: 11.9 g/dL — AB (ref 12.0–15.0)

## 2014-09-22 MED ORDER — IOHEXOL 300 MG/ML  SOLN
50.0000 mL | Freq: Once | INTRAMUSCULAR | Status: AC | PRN
  Administered 2014-09-22: 50 mL via ORAL

## 2014-09-22 MED ORDER — PROMETHAZINE HCL 25 MG/ML IJ SOLN
12.5000 mg | INTRAMUSCULAR | Status: DC | PRN
  Administered 2014-09-22 (×5): 12.5 mg via INTRAVENOUS
  Filled 2014-09-22 (×5): qty 1

## 2014-09-22 NOTE — Plan of Care (Signed)
Problem: Food- and Nutrition-Related Knowledge Deficit (NB-1.1) Goal: Nutrition education Formal process to instruct or train a patient/client in a skill or to impart knowledge to help patients/clients voluntarily manage or modify food choices and eating behavior to maintain or improve health. Outcome: Completed/Met Date Met:  09/22/14 Nutrition Education Note  Received consult for diet education per DROP protocol.   Discussed 2 week post op diet with pt. Emphasized that liquids must be non carbonated, non caffeinated, and sugar free. Fluid goals discussed. Pt to follow up with outpatient bariatric RD for further diet progression after 2 weeks. Multivitamins and minerals also reviewed. Teach back method used, pt expressed understanding, expect good compliance.   Diet: First 2 Weeks  You will see the nutritionist about two (2) weeks after your surgery. The nutritionist will increase the types of foods you can eat if you are handling liquids well:  If you have severe vomiting or nausea and cannot handle clear liquids lasting longer than 1 day, call your surgeon  Protein Shake  Drink at least 2 ounces of shake 5-6 times per day  Each serving of protein shakes (usually 8 - 12 ounces) should have a minimum of:  15 grams of protein  And no more than 5 grams of carbohydrate  Goal for protein each day:  Men = 80 grams per day  Women = 60 grams per day  Protein powder may be added to fluids such as non-fat milk or Lactaid milk or Soy milk (limit to 35 grams added protein powder per serving)   Hydration  Slowly increase the amount of water and other clear liquids as tolerated (See Acceptable Fluids)  Slowly increase the amount of protein shake as tolerated  Sip fluids slowly and throughout the day  May use sugar substitutes in small amounts (no more than 6 - 8 packets per day; i.e. Splenda)   Fluid Goal  The first goal is to drink at least 8 ounces of protein shake/drink per day (or as directed  by the nutritionist); some examples of protein shakes are Syntrax Nectar, Adkins Advantage, EAS Edge HP, and Unjury. See handout from pre-op Bariatric Education Class:  Slowly increase the amount of protein shake you drink as tolerated  You may find it easier to slowly sip shakes throughout the day  It is important to get your proteins in first  Your fluid goal is to drink 64 - 100 ounces of fluid daily  It may take a few weeks to build up to this  32 oz (or more) should be clear liquids  And  32 oz (or more) should be full liquids (see below for examples)  Liquids should not contain sugar, caffeine, or carbonation   Clear Liquids:  Water or Sugar-free flavored water (i.e. Fruit H2O, Propel)  Decaffeinated coffee or tea (sugar-free)  Crystal Lite, Wyler's Lite, Minute Maid Lite  Sugar-free Jell-O  Bouillon or broth  Sugar-free Popsicle: *Less than 20 calories each; Limit 1 per day   Full Liquids:  Protein Shakes/Drinks + 2 choices per day of other full liquids  Full liquids must be:  No More Than 12 grams of Carbs per serving  No More Than 3 grams of Fat per serving  Strained low-fat cream soup  Non-Fat milk  Fat-free Lactaid Milk  Sugar-free yogurt (Dannon Lite & Fit, Greek yogurt)     Doratha Mcswain, MS, RD, LDN Pager: [phone] After Hours Pager: [phone]        

## 2014-09-22 NOTE — Progress Notes (Signed)
Pt was nauseous, dry heaves and emesis, Notified MD on call Phenergan ordered.  Nothing for itching at the bottom of her feet for tonight.  Daijon Wenke Hayford,RN 09/22/14 0133

## 2014-09-22 NOTE — Progress Notes (Signed)
Patient ID: Janice Huang, female   DOB: 1970-08-28, 44 y.o.   MRN: [id number] [facility] Surgery Progress Note:   1 Day Post-Op  Subjective: Mental status is slow but clear.  Complaining of feet itching and reluctant to get up and walk.   Objective: Vital signs in last 24 hours: Temp:  [98.8 F (37.1 C)-99.9 F (37.7 C)] 99.9 F (37.7 C) (05/03 2100) Pulse Rate:  [62-80] 62 (05/03 2100) Resp:  [16-20] 16 (05/03 2100) BP: (122-160)/(63-87) 152/87 mmHg (05/03 2100) SpO2:  [98 %-100 %] 98 % (05/03 2100) Weight:  [152.1 kg (335 lb 5.1 oz)] 152.1 kg (335 lb 5.1 oz) (05/03 0506)  Intake/Output from previous day: 05/02 0701 - 05/03 0700 In: 2948.3 [I.V.:2948.3] Out: 2175 [Urine:2125; Blood:50] Intake/Output this shift: Total I/O In: 300 [I.V.:300] Out: 300 [Urine:300]  Physical Exam: Work of breathing is normal.  Incisions sore.  UGI ok.    Lab Results:  Results for orders placed or performed during the hospital encounter of 09/21/14 (from the past 48 hour(s))  Hemoglobin and hematocrit, blood     Status: None   Collection Time: 09/21/14 11:05 AM  Result Value Ref Range   Hemoglobin DUPLICATE REQUEST 29.4 - 15.0 g/dL    Comment: SEE ACC [id number] CORRECTED ON 05/02 AT 1214: PREVIOUSLY REPORTED AS 03.5    HCT DUPLICATE REQUEST 46.5 - 46.0 %    Comment: SEE ACC [id number] CORRECTED ON 05/02 AT 1214: PREVIOUSLY REPORTED AS 36.8   CBC     Status: None   Collection Time: 09/21/14 11:36 AM  Result Value Ref Range   WBC 8.7 4.0 - 10.5 K/uL   RBC 4.38 3.87 - 5.11 MIL/uL   Hemoglobin 12.5 12.0 - 15.0 g/dL   HCT 38.0 36.0 - 46.0 %   MCV 86.8 78.0 - 100.0 fL   MCH 28.5 26.0 - 34.0 pg   MCHC 32.9 30.0 - 36.0 g/dL   RDW 12.8 11.5 - 15.5 %   Platelets 289 150 - 400 K/uL  Creatinine, serum     Status: Abnormal   Collection Time: 09/21/14 11:36 AM  Result Value Ref Range   Creatinine, Ser 1.10 (H) 0.44 - 1.00 mg/dL   GFR calc non Af Amer >60 >60 mL/min   GFR calc Af Amer >60  >60 mL/min    Comment: (NOTE) The eGFR has been calculated using the CKD EPI equation. This calculation has not been validated in all clinical situations. eGFR's persistently <90 mL/min signify possible Chronic Kidney Disease.   Glucose, capillary     Status: Abnormal   Collection Time: 09/21/14  4:11 PM  Result Value Ref Range   Glucose-Capillary 184 (H) 70 - 99 mg/dL  Glucose, capillary     Status: Abnormal   Collection Time: 09/21/14  7:23 PM  Result Value Ref Range   Glucose-Capillary 172 (H) 70 - 99 mg/dL  Glucose, capillary     Status: Abnormal   Collection Time: 09/21/14 11:37 PM  Result Value Ref Range   Glucose-Capillary 132 (H) 70 - 99 mg/dL  Glucose, capillary     Status: Abnormal   Collection Time: 09/22/14  3:36 AM  Result Value Ref Range   Glucose-Capillary 130 (H) 70 - 99 mg/dL  CBC WITH DIFFERENTIAL     Status: Abnormal   Collection Time: 09/22/14  4:40 AM  Result Value Ref Range   WBC 16.1 (H) 4.0 - 10.5 K/uL    Comment: WHITE COUNT CONFIRMED ON SMEAR   RBC 4.13  3.87 - 5.11 MIL/uL   Hemoglobin 12.1 12.0 - 15.0 g/dL   HCT 35.9 (L) 36.0 - 46.0 %   MCV 86.9 78.0 - 100.0 fL   MCH 29.3 26.0 - 34.0 pg   MCHC 33.7 30.0 - 36.0 g/dL   RDW 12.9 11.5 - 15.5 %   Platelets 276 150 - 400 K/uL    Comment: SPECIMEN CHECKED FOR CLOTS LARGE PLATELETS PRESENT PLATELET COUNT CONFIRMED BY SMEAR    Neutrophils Relative % 80 (H) 43 - 77 %   Lymphocytes Relative 9 (L) 12 - 46 %   Monocytes Relative 11 3 - 12 %   Eosinophils Relative 0 0 - 5 %   Basophils Relative 0 0 - 1 %   Neutro Abs 12.9 (H) 1.7 - 7.7 K/uL   Lymphs Abs 1.4 0.7 - 4.0 K/uL   Monocytes Absolute 1.8 (H) 0.1 - 1.0 K/uL   Eosinophils Absolute 0.0 0.0 - 0.7 K/uL   Basophils Absolute 0.0 0.0 - 0.1 K/uL   Smear Review MORPHOLOGY UNREMARKABLE   Glucose, capillary     Status: Abnormal   Collection Time: 09/22/14  7:58 AM  Result Value Ref Range   Glucose-Capillary 113 (H) 70 - 99 mg/dL  Glucose, capillary      Status: Abnormal   Collection Time: 09/22/14 11:15 AM  Result Value Ref Range   Glucose-Capillary 113 (H) 70 - 99 mg/dL   Comment 1 Notify RN   Hemoglobin and hematocrit, blood     Status: Abnormal   Collection Time: 09/22/14  3:48 PM  Result Value Ref Range   Hemoglobin 11.9 (L) 12.0 - 15.0 g/dL   HCT 36.8 36.0 - 46.0 %  Glucose, capillary     Status: Abnormal   Collection Time: 09/22/14  5:08 PM  Result Value Ref Range   Glucose-Capillary 108 (H) 70 - 99 mg/dL  Glucose, capillary     Status: Abnormal   Collection Time: 09/22/14  7:26 PM  Result Value Ref Range   Glucose-Capillary 128 (H) 70 - 99 mg/dL    Radiology/Results: Dg Ugi W/water Sol Cm  09/22/2014   CLINICAL DATA:  44 year old female status post laparoscopic sleeve gastrectomy yesterday.  EXAM: WATER SOLUBLE UPPER GI SERIES  TECHNIQUE: Single-column upper GI series was performed using water soluble contrast.  CONTRAST:  79m OMNIPAQUE IOHEXOL 300 MG/ML  SOLN  COMPARISON:  07/28/2013.  FLUOROSCOPY TIME:  If the device does not provide the exposure index:  Fluoroscopy Time (in minutes and seconds):  2 minutes and 32 seconds  Number of Acquired Images:  18  FINDINGS: Preprocedure KUB demonstrated a nonobstructive bowel gas pattern, with a suture line projecting over the stomach related to recent sleeve gastrectomy.  Subsequently, following ingestion of Omnipaque, a narrowed gastric lumen was identified, compatible with the reported sleeve gastrectomy. No extravasation of contrast was noted during the examination at any time. Contrast readily traversed the pylorus into the duodenum without evidence of obstruction.  IMPRESSION: 1. Expected postoperative appearance following sleeve gastrectomy, without acute complicating features, as above.   Electronically Signed   By: DVinnie LangtonM.D.   On: 09/22/2014 10:25    Anti-infectives: Anti-infectives    Start     Dose/Rate Route Frequency Ordered Stop   09/21/14 0600  cefOXitin  (MEFOXIN) 2 g in dextrose 5 % 50 mL IVPB     2 g 100 mL/hr over 30 Minutes Intravenous On call to O.R. 09/21/14 0553 09/21/14 0927      Assessment/Plan: Problem  List: Patient Active Problem List   Diagnosis Date Noted  . 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 Diabetes mellitus type 2 in obese 05/06/2012  . Gout 05/06/2012  . Morbid obesity 06/15/2011  . Essential hypertension, benign 12/16/2010  . Hyperlipidemia with target LDL less than 100 12/16/2010  . Visit for screening mammogram 12/16/2010  . ALLERGIC RHINITIS CAUSE UNSPECIFIED 04/16/2010    Advanced to PD 1 liquids.  Continue observation 1 Day Post-Op    LOS: 1 day   Matt B. Hassell Done, MD, [facility] [phone] beeper [phone]  09/22/2014 11:11 PM

## 2014-09-22 NOTE — Op Note (Signed)
Janice Huang, Janice Huang          ACCOUNT NO.:  [id number]  MEDICAL RECORD NO.:  [id number]  LOCATION:  1534                         FACILITY:  [facility]  PHYSICIAN:  Thornton Park. Daphine Deutscher, MD  DATE OF BIRTH:  [birth year]  DATE OF PROCEDURE:  09/21/2014 DATE OF DISCHARGE:                              OPERATIVE REPORT   PREOPERATIVE DIAGNOSIS:  Morbid obesity, multiple comorbidities, BMI of 51.  PROCEDURE:  Laparoscopic sleeve gastrectomy with 1 suture, posterior hiatal hernia repair.  SURGEON:  Thornton Park. Daphine Deutscher, MD  ASSISTANT:  Mary Sella. Andrey Campanile, MD, FACS  DESCRIPTION OF PROCEDURE:  The patient was taken to room 11 and given general anesthesia.  The abdomen was prepped with PCMX and draped sterilely.  Access to the abdomen was achieved through the left upper quadrant using a 5 mm 0 degree Optiview without difficulty.  Following insufflation up with the standard package of 5 mm trocars 1 laterally on the left, 1 medially on the left for the camera, 15 obliquely in the right side and a 5 more lateral and a 5 mm in the upper midline was used for insertion of the Surveyor, mining.  She had genital or some form of a defect or injury of the middle portion of the lateral segment where it looked like it had atrophied and almost looked like a cleft.  This required me to change the medium to the small to really get adequate exposure of the EG junction.  I then measured 5 cm back from the pylorus that we identified and we entered the lesser sac.  I took down the short gastrics all the way up to the left crus and did this with the Harmonic Scalpel.  Bleeding was controlled.  In addition, we attempted to put down the calibration tubing, but she had trouble getting it to pass down into the mouth.  I ended up and then moving over on to the patient's right side opening up the gastrohepatic window and going in and opened along the right crus and dissecting free a reasonable size hiatal hernia.  I  went ahead and closed this with a single stitch of Surgidac held with a tie knot.  We were again unable to pass the calibration tubing.  The patient denied reflux, but on upper GI was found to have a small hiatal hernia.  The physical findings matched the upper GI.  So, with the greater curvature mobilized, I went ahead down to the below and passive ViSiGi into the antrum.  With that in place, we put her on suction and got it oriented properly.  I began the firing with a 4.5 mm black load using Covidien staples with their TRS buttressing material. First firing, I noticed a little pull distally and saw a little slow serosal tear.  We scored this and there was no evidence of a full- thickness injury.  Subsequent firings were with a 6 cm black Covidien load with TRS all the way to the top of the stomach.  The final firing was just lateral to the fat pad.  When we had taken down the adhesions to the back wall, I subsequently noticed that this was actually an opening and through the transverse mesocolon, I  could see small bowel. At that point, I closed this defect with a running V lock suture closing it in its entirety.  I inspected the small bowel in that area and there was no evidence of any injury to that.  The ViSiGi which had been placed as well and put to suction  was then puffed up and we did not see any bubbles or evidence of a leak.  Dr. Andrey CampanileWilson attempted to do open endoscopy, but again just like with the calibration tubing, was unable to get out of the mouth and into the proximal esophagus.  Some Tisseel was applied to the distal part of the staple line.  The specimen was removed.  The 15 mm trocar site was closed with 0 Vicryl using the EndoClose.  The wounds were injected with Exparel down on the fascia level and were irrigated and closed with 4-0 Vicryl and Dermabond.  The patient tolerated the procedure well, was taken to the recovery room in satisfactory  condition.     Thornton ParkMatthew B. Daphine DeutscherMartin, MD     MBM/MEDQ  D:  09/21/2014  T:  09/21/2014  Job:  [id number]

## 2014-09-22 NOTE — Addendum Note (Signed)
Addended by: Etta GrandchildJONES, Brynnan Rodenbaugh L on: 09/22/2014 07:21 AM   Modules accepted: Kipp BroodSmartSet

## 2014-09-22 NOTE — Progress Notes (Signed)
Patient alert and oriented, Post op day 1.  Provided support and encouragement.  Encouraged pulmonary toilet, ambulation and small sips of liquids when swallow study returned satisfactorily.  All questions answered.  Will continue to monitor. 

## 2014-09-23 LAB — CBC WITH DIFFERENTIAL/PLATELET
BASOS PCT: 0 % (ref 0–1)
Basophils Absolute: 0 10*3/uL (ref 0.0–0.1)
Eosinophils Absolute: 0 10*3/uL (ref 0.0–0.7)
Eosinophils Relative: 0 % (ref 0–5)
HCT: 35.9 % — ABNORMAL LOW (ref 36.0–46.0)
Hemoglobin: 11.6 g/dL — ABNORMAL LOW (ref 12.0–15.0)
LYMPHS PCT: 32 % (ref 12–46)
Lymphs Abs: 3.1 10*3/uL (ref 0.7–4.0)
MCH: 28.4 pg (ref 26.0–34.0)
MCHC: 32.3 g/dL (ref 30.0–36.0)
MCV: 87.8 fL (ref 78.0–100.0)
Monocytes Absolute: 1 10*3/uL (ref 0.1–1.0)
Monocytes Relative: 10 % (ref 3–12)
NEUTROS ABS: 5.4 10*3/uL (ref 1.7–7.7)
Neutrophils Relative %: 58 % (ref 43–77)
PLATELETS: 247 10*3/uL (ref 150–400)
RBC: 4.09 MIL/uL (ref 3.87–5.11)
RDW: 13.1 % (ref 11.5–15.5)
WBC: 9.5 10*3/uL (ref 4.0–10.5)

## 2014-09-23 LAB — GLUCOSE, CAPILLARY
Glucose-Capillary: 113 mg/dL — ABNORMAL HIGH (ref 70–99)
Glucose-Capillary: 116 mg/dL — ABNORMAL HIGH (ref 70–99)

## 2014-09-23 NOTE — Discharge Instructions (Signed)

## 2014-09-23 NOTE — Progress Notes (Signed)
Patient alert and oriented, pain is controlled. Patient is tolerating fluids,  advanced to protein shake today, patient tolerated well.  Reviewed Gastric sleeve discharge instructions with patient and patient is able to articulate understanding.  Provided information on BELT program, Support Group and [facility] outpatient pharmacy. All questions answered, will continue to monitor.  

## 2014-09-23 NOTE — Discharge Summary (Signed)
Physician Discharge Summary  Patient ID: Janice Huang MRN: [id number] DOB/AGE: Feb 25, 1971 44 y.o.  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  UGI on PD 1 OK.  Complained of itching feet and this slowed her mobility.   Ready for discharge on PD 2.    Consults: none  Significant Diagnostic Studies: UGI OK    Discharge Exam: Blood pressure 135/74, pulse 59, temperature 99.6 F (37.6 C), temperature source Oral, resp. rate 16, height 5' 7.5" (1.715 m), weight 146.33 kg (322 lb 9.6 oz), SpO2 96 %. Incisions OK  Disposition: 01-Home or Self Care  Discharge Instructions    Ambulate hourly while awake    Complete by:  As directed      Call MD for:  difficulty breathing, headache or visual disturbances    Complete by:  As directed      Call MD for:  persistant dizziness or light-headedness    Complete by:  As directed      Call MD for:  persistant nausea and vomiting    Complete by:  As directed      Call MD for:  redness, tenderness, or signs of infection (pain, swelling, redness, odor or green/yellow discharge around incision site)    Complete by:  As directed      Call MD for:  severe uncontrolled pain    Complete by:  As directed      Call MD for:  temperature >101 F    Complete by:  As directed      Diet bariatric full liquid    Complete by:  As directed      Discharge instructions    Complete by:  As directed   Followup with Dr. Santa Generaommy Jones and let him review your meds for any adjustments.     Incentive spirometry    Complete by:  As directed   Perform hourly while awake            Medication List    TAKE these medications        acetaminophen 500 MG tablet  Commonly known as:  TYLENOL  Take 1,000 mg by mouth  every 6 (six) hours as needed for moderate pain.     allopurinol 100 MG tablet  Commonly known as:  ZYLOPRIM  Take 1 tablet (100 mg total) by mouth daily.     cetirizine-pseudoephedrine 5-120 MG per tablet  Commonly known as:  ZYRTEC-D  Take 1 tablet by mouth every other day.     colchicine 0.6 MG tablet  Commonly known as:  COLCRYS  Take 1 tablet (0.6 mg total) by mouth 2 (two) times daily.     lisinopril-hydrochlorothiazide 20-25 MG per tablet  Commonly known as:  PRINZIDE,ZESTORETIC  Take 1 tablet by mouth daily.     multivitamin with minerals Tabs tablet  Take 1 tablet by mouth every morning.           Follow-up Information    Follow up with Valarie MerinoMARTIN,Millette Halberstam B, MD.   Specialty:  General Surgery   Contact information:   [address] [place] [address] [phone]       Signed: Valarie MerinoMARTIN,Siyah Mault B 09/23/2014, 9:47 AM

## 2014-09-23 NOTE — Progress Notes (Signed)
Patient is refused to ambulate in the hall during the night.  She only took sips of water.   Antiemetic was given.

## 2014-09-24 ENCOUNTER — Telehealth (HOSPITAL_COMMUNITY): Payer: Self-pay

## 2014-09-24 NOTE — Telephone Encounter (Signed)
Attempted DROP discharge phone call, no answer, left message for patient to return call 09/24/14 4:41 PM L.Deaton RN    Made discharge phone call to patient per DROP protocol. Asking the following questions.    1. Do you have someone to care for you now that you are home?   2. Are you having pain now that is not relieved by your pain medication?   3. Are you able to drink the recommended daily amount of fluids (48 ounces minimum/day) and protein (60-80 grams/day) as prescribed by the dietitian or nutritional counselor?   4. Are you taking the vitamins and minerals as prescribed?   5. Do you have the "on call" number to contact your surgeon if you have a problem or question?   6. Are your incisions free of redness, swelling or drainage? (If steri strips, address that these can fall off, shower as tolerated)  7. Have your bowels moved since your surgery?  If not, are you passing gas?   8. Are you up and walking 3-4 times per day?      1. Do you have an appointment made to see your surgeon in the next month?   2. Were you provided your discharge medications before your surgery or before you were discharged from the hospital and are you taking them without problem?   3. Were you provided phone numbers to the clinic/surgeon's office?   4. Did you watch the patient education video module in the (clinic, surgeon's office, etc.) before your surgery?  5. Do you have a discharge checklist that was provided to you in the hospital to reference with instructions on how to take care of yourself after surgery?   6. Did you see a dietitian or nutritional counselor while you were in the hospital?   7. Do you have an appointment to see a dietitian or nutritional counselor in the next month?

## 2014-09-28 ENCOUNTER — Encounter: Payer: Self-pay | Admitting: Internal Medicine

## 2014-09-28 ENCOUNTER — Telehealth: Payer: Self-pay | Admitting: Internal Medicine

## 2014-09-28 ENCOUNTER — Telehealth: Payer: Self-pay | Admitting: *Deleted

## 2014-09-28 NOTE — Telephone Encounter (Signed)
[facility] - Client TELEPHONE ADVICE RECORD [facility]  Patient Name: Janice Huang MemoJACQUETTA Balsley  DOB: March 17, 1971    Initial Comment Caller states she had gastric sleeve done last Monday. She is now having severe pain in her feet--her medication is not working,    Nurse Assessment  Nurse: Laural BenesJohnson, RN, Dondra SpryGail Date/Time (Eastern Time): 09/28/2014 9:22:59 AM  Confirm and document reason for call. If symptomatic, describe symptoms. ---[name] had the gastric sleeve done last Monday and her feet have been hurting her and is taking her gout medication and feet continue to hurt (onset Thursday)  Has the patient traveled out of the country within the last 30 days? ---No  Does the patient require triage? ---Yes  Related visit to physician within the last 2 weeks? ---No  Does the PT have any chronic conditions? (i.e. diabetes, asthma, etc.) ---Yes  List chronic conditions. ---Post op x 1 week gastric sleeve gout  Did the patient indicate they were pregnant? ---No     Guidelines    Guideline Title Affirmed Question Affirmed Notes  Foot Pain [1] SEVERE pain (e.g., excruciating, unable to do any normal activities) AND [2] not improved after 2 hours of pain medicine    Final Disposition User   See Physician within 4 Hours (or PCP triage) Laural BenesJohnson, RN, Dondra SpryGail    Comments  Unable to get her in today but appt given 1015 am with Dr. Donne AnonWillam F. Kathi LudwigHopper (Jones, MD on vacation)

## 2014-09-28 NOTE — Telephone Encounter (Signed)
[facility] - Client TELEPHONE ADVICE RECORD [facility] Patient Name: Janice GerlachJACQUETTA MCMURR Huang Gender: Female DOB: 12/08/1970 Age: [age] Y 29 D Return Phone Number: [phone] (Primary) Address: [address] [place]Zip: [place] [address] Client [facility] - Client Client Site [facility] Day Physician Sanda LingerJones, Thomas Contact Type Call Call Type Triage / Clinical Relationship To Patient Self Appointment Disposition EMR Appointment Scheduled Info pasted into Epic Yes Return Phone Number [phone] (Primary) Chief Complaint Leg Pain Initial Comment Caller states she had gastric sleeve done last Monday. She is now having severe pain in her feet--her medication is not working, PreDisposition Call Doctor Nurse Assessment Nurse: Laural BenesJohnson, RN, Dondra SpryGail Date/Time (Eastern Time): 09/28/2014 9:22:59 AM Confirm and document reason for call. If symptomatic, describe symptoms. ---[name] had the gastric sleeve done last Monday and her feet have been hurting her and is taking her gout medication and feet continue to hurt (onset Thursday) Has the patient traveled out of the country within the last 30 days? ---No Does the patient require triage? ---Yes Related visit to physician within the last 2 weeks? ---No Does the PT have any chronic conditions? (i.e. diabetes, asthma, etc.) ---Yes List chronic conditions. 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 ---No Guidelines Guideline Title Affirmed Question Affirmed Notes Nurse Date/Time Lamount Cohen(Eastern Time) Foot Pain [1] SEVERE pain (e.g., excruciating, unable to do any normal activities) AND [2] not improved after 2 hours of pain medicine Liberty HandyJohnson, RN, Gail 09/28/2014 9:25:34 AM Disp. Time Lamount Cohen(Eastern Time) Disposition Final User 09/28/2014 9:26:36 AM See Physician within 4 Hours (or PCP triage) Yes Laural BenesJohnson, RN, Christiane HaGail PLEASE NOTE:  All timestamps contained within this report are represented as [place] Standard Time. CONFIDENTIALTY NOTICE: This fax transmission is intended only for the addressee. It contains information that is legally privileged, confidential or otherwise protected from use or disclosure. If you are not the intended recipient, you are strictly prohibited from reviewing, disclosing, copying using or disseminating any of this information or taking any action in reliance on or regarding this information. If you have received this fax in error, please notify us immediately by telephone so that we can arrange for its return to us. Phone: [phone], Toll-Free: [phone], Fax: [phone] Page: 2 of 2 Call Id: [id number] Caller Understands: Yes Disagree/Comply: Comply Care Advice Given Per Guideline SEE PHYSICIAN WITHIN 4 HOURS (or PCP triage): * You become worse. CARE ADVICE given per Foot Pain (Adult) guideline. After Care Instructions Given Call Event Type User Date / Time Description Comments User: Fabienne BrunsGail, Johnson, RN Date/Time Lamount Cohen(Eastern Time): 09/28/2014 9:39:49 AM

## 2014-09-29 ENCOUNTER — Other Ambulatory Visit (INDEPENDENT_AMBULATORY_CARE_PROVIDER_SITE_OTHER): Payer: 59

## 2014-09-29 ENCOUNTER — Ambulatory Visit (INDEPENDENT_AMBULATORY_CARE_PROVIDER_SITE_OTHER): Payer: 59 | Admitting: Internal Medicine

## 2014-09-29 VITALS — BP 120/90 | HR 123 | Temp 98.1°F | Ht 67.5 in | Wt 219.2 lb

## 2014-09-29 DIAGNOSIS — M1000 Idiopathic gout, unspecified site: Secondary | ICD-10-CM

## 2014-09-29 DIAGNOSIS — I10 Essential (primary) hypertension: Secondary | ICD-10-CM | POA: Diagnosis not present

## 2014-09-29 LAB — URIC ACID: URIC ACID, SERUM: 11.9 mg/dL — AB (ref 2.4–7.0)

## 2014-09-29 MED ORDER — LISINOPRIL 20 MG PO TABS: 20.0000 mg | ORAL_TABLET | Freq: Every day | ORAL | Status: DC

## 2014-09-29 NOTE — Patient Instructions (Signed)
   Stop the lisinopril/HCT and start plain lisinopril 20 g daily. Monitor blood pressure and follow-up with Dr. Yetta BarreJones in 10-14 days. Minimal Blood Pressure Goal= AVERAGE < 140/90;  Ideal is an AVERAGE < 135/85. This AVERAGE should be calculated from @ least 5-7 BP readings taken @ different times of day on different days of week. You should not respond to isolated BP readings , but rather the AVERAGE for that week .Please bring your  blood pressure cuff to office visits to verify that it is reliable.It  can also be checked against the blood pressure device at the pharmacy. Finger or wrist cuffs are not dependable; an arm cuff is.   Your next office appointment will be determined based upon review of your pending labs .  Those instructions will be transmitted to you by mail for your records.  Critical results will be called.   Followup as needed for any active or acute issue. Please report any significant change in your symptoms.

## 2014-09-29 NOTE — Progress Notes (Signed)
Pre visit review using our clinic review tool, if applicable. No additional management support is needed unless otherwise documented below in the visit note. 

## 2014-09-29 NOTE — Progress Notes (Signed)
   Subjective:    Patient ID: Janice Huang, female    DOB: 1971/01/28, 44 y.o.   MRN: [id number]  HPI Her symptoms began 94/16 as pain in the dorsum of the feet at the base of the great toes. This was described as dull up to level V-VI. Pain would alternate from 1 foot to the other. She used ice and heat. The most dramatic response was to colchicine 0.6 mg twice a day. She has had associated swelling and redness over the base of the great toes and dorsum of the feet.  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 As of 9/3 she was asymptomatic until she had the acute musculoskeletal symptoms.   Review of Systems  She denies fever, chills, or sweats. Chest pain, palpitations, tachycardia, exertional dyspnea, paroxysmal nocturnal dyspnea, claudication or edema are absent.      Objective:   Physical Exam  Pertinent or positive findings include: She appears uncomfortable but in no acute distress.  Abdomen is tender to palpation. Bowel sounds are decreased. There is no clinical ileus.  Pedal pulses are decreased.  She's tender to palpation over the dorsum of the feet at the base of the great toes.  General appearance :adequately nourished; in no distress. Eyes: No conjunctival inflammation or scleral icterus is present. Heart:  Normal rate and regular rhythm. S1 and S2 normal without gallop, murmur, click, rub or other extra sounds   Lungs:Chest clear to auscultation; no wheezes, rhonchi,rales ,or rubs present.No increased work of breathing.  Vascular : all pulses equal ; no bruits present. Skin:Warm & dry.  Intact without suspicious lesions or rashes ; no tenting Lymphatic: No lymphadenopathy is noted about the head, neck, axilla Neuro: Strength, tone normal.        Assessment & Plan:  #1 acute Gout #2 HTN; she is on an ACE inhibitor/HCT See orders

## 2014-10-02 ENCOUNTER — Encounter: Payer: Self-pay | Admitting: Podiatry

## 2014-10-02 ENCOUNTER — Ambulatory Visit (INDEPENDENT_AMBULATORY_CARE_PROVIDER_SITE_OTHER): Payer: 59 | Admitting: Podiatry

## 2014-10-02 VITALS — BP 129/76 | HR 82 | Ht 67.5 in | Wt 315.0 lb

## 2014-10-02 DIAGNOSIS — M79606 Pain in leg, unspecified: Secondary | ICD-10-CM | POA: Insufficient documentation

## 2014-10-02 DIAGNOSIS — M1000 Idiopathic gout, unspecified site: Secondary | ICD-10-CM

## 2014-10-02 MED ORDER — LIDOCAINE 5 % EX PTCH: 1.0000 | MEDICATED_PATCH | CUTANEOUS | Status: DC

## 2014-10-02 NOTE — Progress Notes (Signed)
Subjective: 44 year old female presents accompanied by her husband complaining of pain and swelling on top of both feet x 1 week.  Pain is more superficial soft tissue and skin area and not deep down to joints.  Initially she barely was able to get up and walk to bathroom when first started in the morning as she was getting out of bed. The pain is less now but still significant enough to prevent her from doing her exercise that is required for her to do following a surgery that she received in 09/21/2014, Partial gastrectomy. Stated that she has flat feet and hurts to wear flat shoes.  Stated that she used to take Gout medication and had to stop for a couple of weeks due to the surgery. She is not back on the medication, Zyloprim 100mg  daily.   Review of Systems - General ROS: negative for - chills, fever, night sweats or sleep disturbance Ophthalmic ROS: negative ENT ROS: negative Allergy and Immunology ROS: negative Hematological and Lymphatic ROS: negative Endocrine ROS: negative Breast ROS: negative for breast lumps Respiratory ROS: no cough, shortness of breath, or wheezing Cardiovascular ROS: no chest pain or dyspnea on exertion Gastrointestinal ROS: Recent Gastric surgery still in post op care.  Genito-Urinary ROS: no dysuria, trouble voiding, or hematuria Musculoskeletal ROS: negative Neurological ROS: Foot pain. Dermatological ROS: negative.  Objective: Dermatologic: No abnormal skin lesions noted. Had had ingrown nail removed in past on left great toe.  Vascular: Pedal pulses are not palpable due to edema on forefoot area at MPJ level bilateral. No increase in temperature, no abnormal color change noted. Neurologic: All epicritic and tactile sensations grossly intact. Orthopedic: Patient is on guard and having difficulty checking on range of motion in foot and ankle. Flat feet.   Assessment: Idiopathic soft tissue pain and swelling bilateral forefoot. R/O acute gouty  arthropathy forefoot bilateral after temporary holding off of Zyloprim due to surgery. . 11 day post op, Status post Gastric surgery.  Pes Planus bilateral.  Plan: Reviewed findings and available options. Patient refused to receive cortisone injection.  Instead she will try Lododerm patch to sooth the acute pain.  May benefit from custom orthotics.

## 2014-10-02 NOTE — Patient Instructions (Signed)
Seen for possible Gout on both feet, dorsum. Lidoderm patch ordered. May benefit from custom orthotics for flatfeet condition. Return to have orthotics prepared.

## 2014-10-05 ENCOUNTER — Ambulatory Visit: Payer: 59 | Admitting: Internal Medicine

## 2014-10-06 ENCOUNTER — Encounter: Payer: 59 | Attending: Surgery

## 2014-10-06 DIAGNOSIS — Z6841 Body Mass Index (BMI) 40.0 and over, adult: Secondary | ICD-10-CM | POA: Insufficient documentation

## 2014-10-06 DIAGNOSIS — Z713 Dietary counseling and surveillance: Secondary | ICD-10-CM | POA: Diagnosis not present

## 2014-10-06 NOTE — Progress Notes (Signed)
Bariatric Class:  Appt start time: 1530 end time:  1630.  2 Week Post-Operative Nutrition Class  Patient was seen on 10/06/14 for Post[facility]ative Nutrition [facility]   Surgery date: 09/21/14 Surgery type: Gastric sleeve Start weight at [facility]: 346 lbs on 07/11/14 Weight today: 310.0 lbs  Weight change: 31 lbs  TANITA  BODY COMP RESULTS  08/10/14 10/06/14   BMI (kg/m^2) 52.6 47.8   Fat Mass (lbs) 204 183.5   Fat Free Mass (lbs) 137 126.5   Total Body Water (lbs) 100.5 92.5    The following the learning objectives were met by the patient during this course:  Identifies Phase 3A (Soft, High Proteins) Dietary Goals and will begin from 2 weeks post-operatively to 2 months post-operatively  Identifies appropriate sources of fluids and proteins   States protein recommendations and appropriate sources post-operatively  Identifies the need for appropriate texture modifications, mastication, and bite sizes when consuming solids  Identifies appropriate multivitamin and calcium sources post-operatively  Describes the need for physical activity post-operatively and will follow MD recommendations  States when to call healthcare provider regarding medication questions or post-operative complications  Handouts given during class include:  Phase 3A: Soft, High Protein Diet Handout  Follow-Up Plan: Patient will follow-up at [facility] in 6 weeks for 2 month post-op nutrition visit for diet advancement per MD.

## 2014-10-12 ENCOUNTER — Ambulatory Visit (INDEPENDENT_AMBULATORY_CARE_PROVIDER_SITE_OTHER): Payer: 59 | Admitting: Internal Medicine

## 2014-10-12 ENCOUNTER — Encounter: Payer: Self-pay | Admitting: Internal Medicine

## 2014-10-12 VITALS — BP 112/82 | HR 69 | Temp 98.4°F | Wt 308.8 lb

## 2014-10-12 DIAGNOSIS — M1000 Idiopathic gout, unspecified site: Secondary | ICD-10-CM

## 2014-10-12 DIAGNOSIS — I10 Essential (primary) hypertension: Secondary | ICD-10-CM | POA: Diagnosis not present

## 2014-10-12 MED ORDER — COLCHICINE 0.6 MG PO TABS: 0.6000 mg | ORAL_TABLET | Freq: Two times a day (BID) | ORAL | Status: DC

## 2014-10-12 MED ORDER — ALLOPURINOL 100 MG PO TABS: 100.0000 mg | ORAL_TABLET | Freq: Every day | ORAL | Status: DC

## 2014-10-12 NOTE — Patient Instructions (Signed)

## 2014-10-12 NOTE — Progress Notes (Signed)
Subjective:  Patient ID: Janice Huang, female    DOB: [birth year]  Age: 44 y.o. MRN: [id number]  CC: Follow-up; Gout; and Hypertension   HPI Janice Huang presents for gout f/up - she has not been taking allopurinol and uric acid level was very high. The pain and swelling in her 1st MTP joints has resolved.  Outpatient Prescriptions Prior to Visit  Medication Sig Dispense Refill  . lidocaine (LIDODERM) 5 % Place 1 patch onto the skin daily. Remove & Discard patch within 12 hours or as directed by MD 10 patch 1  . lisinopril (PRINIVIL,ZESTRIL) 20 MG tablet Take 1 tablet (20 mg total) by mouth daily. 30 tablet 2  . Multiple Vitamin (MULTIVITAMIN WITH MINERALS) TABS tablet Take 1 tablet by mouth every morning.    . colchicine (COLCRYS) 0.6 MG tablet Take 1 tablet (0.6 mg total) by mouth 2 (two) times daily. (Patient taking differently: Take 0.6 mg by mouth 2 (two) times daily as needed (gout). ) 180 tablet 3  . acetaminophen (TYLENOL) 500 MG tablet Take 1,000 mg by mouth every 6 (six) hours as needed for moderate pain.    Marland Kitchen allopurinol (ZYLOPRIM) 100 MG tablet Take 1 tablet (100 mg total) by mouth daily. 30 tablet 5  . cetirizine-pseudoephedrine (ZYRTEC-D) 5-120 MG per tablet Take 1 tablet by mouth every other day.    . ondansetron (ZOFRAN-ODT) 4 MG disintegrating tablet     . oxyCODONE (ROXICODONE) 5 MG/5ML solution      No facility-administered medications prior to visit.    ROS Review of Systems  Constitutional: Negative.  Negative for fever, chills, diaphoresis, appetite change and fatigue.  HENT: Negative.   Eyes: Negative.   Respiratory: Negative.  Negative for cough, choking, chest tightness, shortness of breath and stridor.   Cardiovascular: Negative.  Negative for chest pain, palpitations and leg swelling.  Gastrointestinal: Negative.  Negative for nausea, vomiting, abdominal pain, diarrhea, constipation and abdominal distention.  Endocrine: Negative.     Genitourinary: Negative.   Musculoskeletal: Negative.   Skin: Negative.   Allergic/Immunologic: Negative.   Neurological: Negative.   Hematological: Negative.  Negative for adenopathy. Does not bruise/bleed easily.  Psychiatric/Behavioral: Negative.     Objective:  BP 112/82 mmHg  Pulse 69  Temp(Src) 98.4 F (36.9 C) (Oral)  Wt 308 lb 12.8 oz (140.071 kg)  SpO2 97%  BP Readings from Last 3 Encounters:  10/12/14 112/82  10/02/14 129/76  09/29/14 120/90    Wt Readings from Last 3 Encounters:  10/12/14 308 lb 12.8 oz (140.071 kg)  10/06/14 310 lb (140.615 kg)  10/02/14 315 lb (142.883 kg)    Physical Exam  Constitutional: She is oriented to person, place, and time. She appears well-developed and well-nourished. No distress.  HENT:  Head: Normocephalic and atraumatic.  Mouth/Throat: Oropharynx is clear and moist. No oropharyngeal exudate.  Eyes: Conjunctivae are normal. Right eye exhibits no discharge. Left eye exhibits no discharge. No scleral icterus.  Neck: Normal range of motion. Neck supple. No JVD present. No tracheal deviation present. No thyromegaly present.  Cardiovascular: Normal rate, regular rhythm, normal heart sounds and intact distal pulses.  Exam reveals no gallop and no friction rub.   No murmur heard. Pulmonary/Chest: Effort normal and breath sounds normal. No stridor. No respiratory distress. She has no wheezes. She has no rales. She exhibits no tenderness.  Abdominal: Soft. Bowel sounds are normal. She exhibits no distension and no mass. There is no tenderness. There is no rebound and no  guarding.  Musculoskeletal: Normal range of motion. She exhibits no edema or tenderness.       Right foot: Normal.       Left foot: Normal.  Lymphadenopathy:    She has no cervical adenopathy.  Neurological: She is oriented to person, place, and time.  Skin: Skin is warm and dry. No rash noted. She is not diaphoretic. No erythema. No pallor.  Vitals reviewed.   Lab  Results  Component Value Date   WBC 9.5 09/23/2014   HGB 11.6* 09/23/2014   HCT 35.9* 09/23/2014   PLT 247 09/23/2014   GLUCOSE 117* 09/14/2014   CHOL 249* 05/11/2014   TRIG 128.0 05/11/2014   HDL 39.60 05/11/2014   LDLDIRECT 217.2 05/23/2013   LDLCALC 184* 05/11/2014   ALT 24 05/23/2013   AST 24 05/23/2013   NA 137 09/14/2014   K 3.9 09/14/2014   CL 101 09/14/2014   CREATININE 1.10* 09/21/2014   BUN 19 09/14/2014   CO2 28 09/14/2014   TSH 1.28 05/11/2014   HGBA1C 6.6* 05/11/2014   09/29/14 1109     Resulting lab: [facility]   Reference range: 2.4 - 7.0 mg/dL   Value: 47.811.9 (High)   Comment:     Mm Digital Screening Bilateral  09/21/2014   CLINICAL DATA:  Screening.  EXAM: DIGITAL SCREENING BILATERAL MAMMOGRAM WITH CAD  COMPARISON:  Previous exam(s).  ACR Breast Density Category b: There are scattered areas of fibroglandular density.  FINDINGS: There are no findings suspicious for malignancy. Images were processed with CAD.  IMPRESSION: No mammographic evidence of malignancy. A result letter of this screening mammogram will be mailed directly to the patient.  RECOMMENDATION: Screening mammogram in one year. (Code:SM-B-01Y)  BI-RADS CATEGORY  1: Negative.   Electronically Signed   By: Britta MccreedySusan  Turner M.D.   On: 09/21/2014 16:29    Assessment & Plan:   Janice Huang was seen today for follow-up, gout and hypertension.  Diagnoses and all orders for this visit:  Idiopathic gout, unspecified chronicity, unspecified site Orders: -     allopurinol (ZYLOPRIM) 100 MG tablet; Take 1 tablet (100 mg total) by mouth daily. -     colchicine (COLCRYS) 0.6 MG tablet; Take 1 tablet (0.6 mg total) by mouth 2 (two) times daily.  Essential hypertension, benign - her BP is well controled.   I have discontinued [name] cetirizine-pseudoephedrine, ondansetron, and oxyCODONE. I am also having her maintain her acetaminophen, multivitamin with minerals, lisinopril, lidocaine,  allopurinol, and colchicine.  Meds ordered this encounter  Medications  . allopurinol (ZYLOPRIM) 100 MG tablet    Sig: Take 1 tablet (100 mg total) by mouth daily.    Dispense:  30 tablet    Refill:  11  . colchicine (COLCRYS) 0.6 MG tablet    Sig: Take 1 tablet (0.6 mg total) by mouth 2 (two) times daily.    Dispense:  180 tablet    Refill:  3     Follow-up: Return in about 4 months (around 02/12/2015).  Sanda Lingerhomas Genifer Lazenby, MD

## 2014-10-20 ENCOUNTER — Telehealth: Payer: Self-pay | Admitting: Internal Medicine

## 2014-10-20 NOTE — Telephone Encounter (Signed)
Come in for a steroid injection

## 2014-10-20 NOTE — Telephone Encounter (Signed)
Pt called in and said that she is on 2 different ghout meds and she is still in pain.  She can not put any weight on her foot at all.  What should she do?     Best number [phone]

## 2014-10-22 ENCOUNTER — Ambulatory Visit (INDEPENDENT_AMBULATORY_CARE_PROVIDER_SITE_OTHER): Payer: 59 | Admitting: *Deleted

## 2014-10-22 DIAGNOSIS — M1000 Idiopathic gout, unspecified site: Secondary | ICD-10-CM

## 2014-10-22 MED ORDER — METHYLPREDNISOLONE ACETATE 80 MG/ML IJ SUSP
120.0000 mg | Freq: Once | INTRAMUSCULAR | Status: AC
  Administered 2014-10-22: 120 mg via INTRAMUSCULAR

## 2014-11-10 ENCOUNTER — Telehealth: Payer: Self-pay | Admitting: Internal Medicine

## 2014-11-10 DIAGNOSIS — M1000 Idiopathic gout, unspecified site: Secondary | ICD-10-CM

## 2014-11-10 NOTE — Telephone Encounter (Signed)
What are her symptoms? What do her feet look like? What is she doing about the pain?

## 2014-11-10 NOTE — Telephone Encounter (Signed)
Patient called in. I did tell her that a rheumatology referral was entered.  She would like to know if there is anything she can do while she is waiting for appt.

## 2014-11-10 NOTE — Telephone Encounter (Signed)
Rheumatology referral

## 2014-11-10 NOTE — Telephone Encounter (Signed)
Patient states she is having another gout attack.  She would like a call back in regards.  She states that she believes Dr. Yetta Barre has done everything he can do in regards.

## 2014-11-10 NOTE — Telephone Encounter (Signed)
Please advise on any additional recommendations for this pt gout flare.

## 2014-11-11 ENCOUNTER — Ambulatory Visit (INDEPENDENT_AMBULATORY_CARE_PROVIDER_SITE_OTHER): Payer: 59 | Admitting: Internal Medicine

## 2014-11-11 VITALS — BP 116/76 | HR 71 | Temp 98.7°F | Resp 16 | Ht 67.5 in

## 2014-11-11 DIAGNOSIS — M10072 Idiopathic gout, left ankle and foot: Secondary | ICD-10-CM

## 2014-11-11 DIAGNOSIS — M1000 Idiopathic gout, unspecified site: Secondary | ICD-10-CM | POA: Diagnosis not present

## 2014-11-11 MED ORDER — TRAMADOL HCL 50 MG PO TABS: 50.0000 mg | ORAL_TABLET | Freq: Three times a day (TID) | ORAL | Status: DC | PRN

## 2014-11-11 MED ORDER — COLCHICINE 0.6 MG PO TABS: 0.6000 mg | ORAL_TABLET | Freq: Three times a day (TID) | ORAL | Status: DC

## 2014-11-11 MED ORDER — METHYLPREDNISOLONE ACETATE 80 MG/ML IJ SUSP
120.0000 mg | Freq: Once | INTRAMUSCULAR | Status: AC
  Administered 2014-11-11: 120 mg via INTRAMUSCULAR

## 2014-11-11 NOTE — Telephone Encounter (Signed)
Patient schedule appt for this afternoon with MD.

## 2014-11-11 NOTE — Patient Instructions (Signed)

## 2014-11-12 ENCOUNTER — Telehealth: Payer: Self-pay | Admitting: Internal Medicine

## 2014-11-12 ENCOUNTER — Encounter: Payer: Self-pay | Admitting: Internal Medicine

## 2014-11-12 NOTE — Progress Notes (Signed)
Subjective:  Patient ID: Janice Huang, female    DOB: 03/30/1971  Age: 44 y.o. MRN: [id number]  CC: Gout   HPI Janice Huang presents for evaluation of foot and ankle pain, the pain recurred about one week ago and started in left great toe then migrated to the right great toe and is now located in the left ankle, there is also swelling. This is presumed caused by gout. She got a depo-medrol IM injection about 3-4 weeks ago and was better but now that pain has returned.  Outpatient Prescriptions Prior to Visit  Medication Sig Dispense Refill  . acetaminophen (TYLENOL) 500 MG tablet Take 1,000 mg by mouth every 6 (six) hours as needed for moderate pain.    Marland Kitchen allopurinol (ZYLOPRIM) 100 MG tablet Take 1 tablet (100 mg total) by mouth daily. 30 tablet 11  . lidocaine (LIDODERM) 5 % Place 1 patch onto the skin daily. Remove & Discard patch within 12 hours or as directed by MD 10 patch 1  . lisinopril (PRINIVIL,ZESTRIL) 20 MG tablet Take 1 tablet (20 mg total) by mouth daily. 30 tablet 2  . Multiple Vitamin (MULTIVITAMIN WITH MINERALS) TABS tablet Take 1 tablet by mouth every morning.    . colchicine (COLCRYS) 0.6 MG tablet Take 1 tablet (0.6 mg total) by mouth 2 (two) times daily. 180 tablet 3   No facility-administered medications prior to visit.    ROS Review of Systems  Constitutional: Negative.  Negative for fever, chills, diaphoresis, appetite change and fatigue.  HENT: Negative.   Eyes: Negative.   Respiratory: Negative.  Negative for cough, choking, shortness of breath and stridor.   Cardiovascular: Negative.  Negative for chest pain, palpitations and leg swelling.  Gastrointestinal: Negative.  Negative for nausea, vomiting, abdominal pain, diarrhea, constipation and blood in stool.  Endocrine: Negative.   Genitourinary: Negative.   Musculoskeletal: Positive for arthralgias. Negative for myalgias, back pain and joint swelling.  Skin: Negative.  Negative for rash.    Neurological: Negative.   Hematological: Negative.  Negative for adenopathy. Does not bruise/bleed easily.  Psychiatric/Behavioral: Negative.     Objective:  BP 116/76 mmHg  Pulse 71  Temp(Src) 98.7 F (37.1 C) (Oral)  Ht 5' 7.5" (1.715 m)  Wt   SpO2 99%  BP Readings from Last 3 Encounters:  11/11/14 116/76  10/12/14 112/82  10/02/14 129/76    Wt Readings from Last 3 Encounters:  10/12/14 308 lb 12.8 oz (140.071 kg)  10/06/14 310 lb (140.615 kg)  10/02/14 315 lb (142.883 kg)    Physical Exam  Constitutional: She appears well-developed and well-nourished.  Non-toxic appearance. She does not have a sickly appearance. She appears ill. No distress.  She is in a wheelchair today  HENT:  Mouth/Throat: No oropharyngeal exudate.  Eyes: Right eye exhibits no discharge. Left eye exhibits no discharge. No scleral icterus.  Neck: Normal range of motion. Neck supple. No JVD present. No tracheal deviation present. No thyromegaly present.  Cardiovascular: Normal rate, regular rhythm, normal heart sounds and intact distal pulses.  Exam reveals no gallop and no friction rub.   No murmur heard. Pulmonary/Chest: Breath sounds normal. No stridor. No respiratory distress. She has no wheezes. She has no rales. She exhibits no tenderness.  Abdominal: Soft. Bowel sounds are normal. She exhibits no distension and no mass. There is no tenderness. There is no rebound and no guarding.  Musculoskeletal: Normal range of motion. She exhibits no edema or tenderness.  Left ankle: She exhibits swelling. She exhibits normal range of motion, no ecchymosis, no deformity, no laceration and normal pulse.       Right foot: There is swelling (over the 1st MTP joint). There is normal range of motion, no tenderness, no bony tenderness, normal capillary refill, no crepitus, no deformity and no laceration.       Left foot: There is swelling (over the 1st MTP joint). There is normal range of motion, no tenderness,  no bony tenderness, normal capillary refill, no crepitus, no deformity and no laceration.  Lymphadenopathy:    She has no cervical adenopathy.  Skin: Skin is warm and dry. No rash noted. She is not diaphoretic. No erythema. No pallor.  Psychiatric: She has a normal mood and affect. Her behavior is normal. Judgment and thought content normal.  Vitals reviewed.   Lab Results  Component Value Date   WBC 9.5 09/23/2014   HGB 11.6* 09/23/2014   HCT 35.9* 09/23/2014   PLT 247 09/23/2014   GLUCOSE 117* 09/14/2014   CHOL 249* 05/11/2014   TRIG 128.0 05/11/2014   HDL 39.60 05/11/2014   LDLDIRECT 217.2 05/23/2013   LDLCALC 184* 05/11/2014   ALT 24 05/23/2013   AST 24 05/23/2013   NA 137 09/14/2014   K 3.9 09/14/2014   CL 101 09/14/2014   CREATININE 1.10* 09/21/2014   BUN 19 09/14/2014   CO2 28 09/14/2014   TSH 1.28 05/11/2014   HGBA1C 6.6* 05/11/2014    Mm Digital Screening Bilateral  09/21/2014   CLINICAL DATA:  Screening.  EXAM: DIGITAL SCREENING BILATERAL MAMMOGRAM WITH CAD  COMPARISON:  Previous exam(s).  ACR Breast Density Category b: There are scattered areas of fibroglandular density.  FINDINGS: There are no findings suspicious for malignancy. Images were processed with CAD.  IMPRESSION: No mammographic evidence of malignancy. A result letter of this screening mammogram will be mailed directly to the patient.  RECOMMENDATION: Screening mammogram in one year. (Code:SM-B-01Y)  BI-RADS CATEGORY  1: Negative.   Electronically Signed   By: Britta Mccreedy M.D.   On: 09/21/2014 16:29    Assessment & Plan:   Janice Huang was seen today for gout.  Diagnoses and all orders for this visit:  Acute idiopathic gout of left foot - will increase the colchicine to TID, another depo-medrol injection was given today, she will not take percocet - will try tramadol for pain, she has been referred to rheumatology, out of work for now Orders: -     colchicine (COLCRYS) 0.6 MG tablet; Take 1 tablet (0.6  mg total) by mouth 3 (three) times daily with meals. -     traMADol (ULTRAM) 50 MG tablet; Take 1 tablet (50 mg total) by mouth every 8 (eight) hours as needed. -     methylPREDNISolone acetate (DEPO-MEDROL) injection 120 mg; Inject 1.5 mLs (120 mg total) into the muscle once.  Idiopathic gout, unspecified chronicity, unspecified site - as above Orders: -     colchicine (COLCRYS) 0.6 MG tablet; Take 1 tablet (0.6 mg total) by mouth 3 (three) times daily with meals. -     traMADol (ULTRAM) 50 MG tablet; Take 1 tablet (50 mg total) by mouth every 8 (eight) hours as needed.   I have changed Ms. Chamblee's colchicine. I am also having her start on traMADol. Additionally, I am having her maintain her acetaminophen, multivitamin with minerals, lisinopril, lidocaine, and allopurinol. We administered methylPREDNISolone acetate.  Meds ordered this encounter  Medications  . colchicine (COLCRYS) 0.6 MG  tablet    Sig: Take 1 tablet (0.6 mg total) by mouth 3 (three) times daily with meals.    Dispense:  270 tablet    Refill:  1  . traMADol (ULTRAM) 50 MG tablet    Sig: Take 1 tablet (50 mg total) by mouth every 8 (eight) hours as needed.    Dispense:  65 tablet    Refill:  2  . methylPREDNISolone acetate (DEPO-MEDROL) injection 120 mg    Sig:      Follow-up: Return in about 4 weeks (around 12/09/2014).  Sanda Linger, MD

## 2014-11-12 NOTE — Telephone Encounter (Signed)
Patient needs an out work note to return on Monday. Please let her know when it is ready. Her mother works upstairs and she will have her pick it up.

## 2014-11-17 ENCOUNTER — Encounter: Payer: 59 | Attending: Surgery | Admitting: Dietician

## 2014-11-17 ENCOUNTER — Encounter: Payer: Self-pay | Admitting: Dietician

## 2014-11-17 DIAGNOSIS — Z6841 Body Mass Index (BMI) 40.0 and over, adult: Secondary | ICD-10-CM | POA: Insufficient documentation

## 2014-11-17 DIAGNOSIS — Z713 Dietary counseling and surveillance: Secondary | ICD-10-CM | POA: Insufficient documentation

## 2014-11-17 NOTE — Patient Instructions (Addendum)
Goals:  Follow Phase 3B: High Protein + Non-Starchy Vegetables  Eat 3-6 small meals/snacks, every 3-5 hrs  Increase lean protein foods to meet 60g goal  Increase fluid intake to 64oz +  Avoid drinking 15 minutes before, during and 30 minutes after eating  Aim for >30 min of physical activity daily  Proteins lower in purines: eggs, low fat cheese, yogurt, chicken and [place], beans, almonds (limit portions), Quest protein chips, edamame  Try veggie burgers or black bean burgers by [employer]   Try a salt substitute    TANITA  BODY COMP RESULTS  08/10/14 10/06/14 11/17/14   BMI (kg/m^2) 52.6 47.8 45.4   Fat Mass (lbs) 204 183.5 156.5   Fat Free Mass (lbs) 137 126.5 138   Total Body Water (lbs) 100.5 92.5 101     Foods high in potassium:  Vegetables  o Zucchini,  cup cooked has 280 mg potassium Legumes  o Soybeans,  cup cooked has 440 mg potassium o Lentils,  cup cooked has 370 mg potassium o Kidney beans,  cup cooked has 360 mg potassium o White beans, canned, drained, half cup: 595 mg Protein o Yogurt, fat-free, 1 cup: 579 mg o Halibut, 3 ounces, cooked: 490 mg o Pork tenderloin, 3 ounces, cooked: 382 mg o Milk, skim or soy, 8 ounces: 340-410 mg o Salmon, farmed [place], 3 ounces, cooked: 326 mg o Chicken breast, 3 ounces, cooked: 218 mg o Tuna, light, canned, drained, 3 ounces: 201 mg

## 2014-11-17 NOTE — Progress Notes (Signed)
  Follow-up visit:  7 Weeks Post-Operative Gastric sleeve Surgery  Medical Nutrition Therapy:  Appt start time: 445 end time:  530  Primary concerns today: Post-operative Bariatric Surgery Nutrition Management.  Janice Huang returns today having lost another 15.5 lbs. She reports that she is struggling with gout flares. Also having nausea. She is finding herself needing to rely on protein shakes. Can only tolerate about 1.5 oz of meat at a time.   Surgery date: 09/21/14 Surgery type: Gastric sleeve [facility]: 346 lbs on 07/11/14 Weight today: 294.5 lbs Weight change: 15.5 lbs Total weight lost: 51.5 lbs  TANITA  BODY COMP RESULTS  08/10/14 10/06/14 11/17/14   BMI (kg/m^2) 52.6 47.8 45.4   Fat Mass (lbs) 204 183.5 156.5   Fat Free Mass (lbs) 137 126.5 138   Total Body Water (lbs) 100.5 92.5 101    Preferred Learning Style:   No preference indicated   Learning Readiness:   Ready  24-hr recall: B (8:30-9AM): Premier protein shake (30g)  Snk (9:45AM): some yogurt (7g)  L (1PM): 1.5 oz chicken or beef (10g) Snk (2:45PM): rest of yogurt (7g)  D (6PM): chicken or beef and beans (10g) Snk (PM): yogurt or protein shake (6g)  Fluid intake: G2 or Powerade Zero, protein shake (64 oz per day) Estimated total protein intake: 60g  Medications: see list Supplementation: taking   Using straws: no Drinking while eating: sometimes sips Hair loss: no Carbonated beverages: no N/V/D/C: some vomiting when she eats too fast; regular bowel movements Dumping syndrome: none  Recent physical activity:  None due to gout flares  Progress Towards Goal(s):  In progress.  Handouts given during visit include:  Phase 3B lean protein + non starchy vegetables   Gout nutrition therapy  Bariatric snack ideas  Sample provided and patient instructed on proper use: Unjury protein powder (unflavored - qty 2) Lot#: [id number] Exp: 11/2015   Nutritional Diagnosis:  [place]-3.3 Overweight/obesity  related to past poor dietary habits and physical inactivity as evidenced by patient w/ recent gastric sleeve surgery following dietary guidelines for continued weight loss.     Intervention:  Nutrition counseling provided.  Teaching Method Utilized:  Visual Auditory Hands on  Barriers to learning/adherence to lifestyle change: gout flares  Demonstrated degree of understanding via:  Teach Back   Monitoring/Evaluation:  Dietary intake, exercise, and body weight. Follow up in 6 weeks for 3 month post-op visit.

## 2014-12-02 ENCOUNTER — Telehealth: Payer: Self-pay | Admitting: Internal Medicine

## 2014-12-02 DIAGNOSIS — I10 Essential (primary) hypertension: Secondary | ICD-10-CM

## 2014-12-02 MED ORDER — LISINOPRIL 20 MG PO TABS: 20.0000 mg | ORAL_TABLET | Freq: Every day | ORAL | Status: DC

## 2014-12-02 NOTE — Telephone Encounter (Signed)
Patient is requesting refill for lisinopril (PRINIVIL,ZESTRIL) 20 MG tablet [[id number]][[id number]]  She does have an appointment coming up in September

## 2014-12-29 ENCOUNTER — Telehealth: Payer: Self-pay | Admitting: Internal Medicine

## 2014-12-29 ENCOUNTER — Ambulatory Visit (INDEPENDENT_AMBULATORY_CARE_PROVIDER_SITE_OTHER): Payer: 59 | Admitting: Internal Medicine

## 2014-12-29 ENCOUNTER — Encounter: Payer: Self-pay | Admitting: Internal Medicine

## 2014-12-29 VITALS — BP 132/84 | HR 57 | Temp 98.2°F | Resp 18 | Wt 281.0 lb

## 2014-12-29 DIAGNOSIS — L739 Follicular disorder, unspecified: Secondary | ICD-10-CM

## 2014-12-29 DIAGNOSIS — L200 Besnier's prurigo: Secondary | ICD-10-CM | POA: Diagnosis not present

## 2014-12-29 DIAGNOSIS — L239 Allergic contact dermatitis, unspecified cause: Secondary | ICD-10-CM

## 2014-12-29 MED ORDER — DOXYCYCLINE HYCLATE 100 MG PO TABS: 100.0000 mg | ORAL_TABLET | Freq: Two times a day (BID) | ORAL | Status: DC

## 2014-12-29 MED ORDER — MUPIROCIN 2 % EX OINT: TOPICAL_OINTMENT | CUTANEOUS | Status: DC

## 2014-12-29 NOTE — Progress Notes (Signed)
   Subjective:    Patient ID: Janice Huang, female    DOB: 07-15-1970, 44 y.o.   MRN: [id number]  HPI  She had her hair braided 12/19/14; this was supposed be employing human hair. She believes it was synthetic hair.She developed itching and burning diffusely after the braiding. This was made worse by the increased heat while she was in [place]. She is using Benadryl for the itching. She's also shampooed with Selsun Blue.  She has no associated constitutional, upper respiratory tract infection, or other symptoms to suggest a systemic process.  Yesterday she unbraided the hair without significant improvement  She has a history of prediabetes. She had bariatric surgery 09/21/14.    Review of Systems Frontal headache, facial pain , nasal purulence, dental pain, sore throat , otic pain or otic discharge denied.  No associated itchy, watery eyes.  Swelling of the lips or tongue denied.  Shortness of breath, wheezing, or cough absent.  No urticaria noted.  Fever ,chills , or sweats denied. Purulence absent.  Diarrhea not present.       Objective:   Physical Exam  General appearance:Adequately nourished; no acute distress or increased work of breathing is present.    Lymphatic: No  lymphadenopathy about the head, neck, or axilla .  Eyes: No conjunctival inflammation or lid edema is present. There is no scleral icterus.  Ears:  External ear exam shows no significant lesions or deformities.  Otoscopic examination reveals clear canals, tympanic membranes are intact bilaterally without bulging, retraction, inflammation or discharge.  Nose:  External nasal examination shows no deformity or inflammation. Nasal mucosa are pink and moist without lesions or exudates No septal dislocation or deviation.No obstruction to airflow.   Oral exam: Dental hygiene is good; lips and gums are healthy appearing.There is no oropharyngeal erythema or exudate .  Neck:  No deformities,  thyromegaly, masses, or tenderness noted.   Supple with full range of motion without pain.   Heart:  Normal rate and regular rhythm. S1 and S2 normal without gallop, murmur, click, rub or other extra sounds.   Lungs:Chest clear to auscultation; no wheezes, rhonchi,rales ,or rubs present.  Extremities:  No cyanosis, edema, or clubbing  noted    Skin: Warm & dry w/o tenting . She has myriad small papules to large verrucoid type lesions in the scalp. These are most prominent at the inferioposterior scalp line. The largest measured 12 x 10 mm. In the scalp over the crown lesions are much smaller, as small as 1 x 1 mm. .       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

## 2014-12-29 NOTE — Patient Instructions (Signed)
Use T-Gel , a coal tar shampoo, daily X 1 week. This will have an antibacterial effect on scalp lesions.

## 2014-12-29 NOTE — Telephone Encounter (Signed)
Patient Name: Janice Huang  DOB: 18-Feb-1971    Initial Comment Caller states she is having allergic reaction to possible hair used for her braids. Has rash around neck, face, and temple.   Nurse Assessment  Nurse: Sherilyn Cooter, RN, Thurmond Butts Date/Time Lamount Cohen Time): 12/29/2014 1:45:22 PM  Confirm and document reason for call. If symptomatic, describe symptoms. ---Caller states that she had some braids with hair put in at the early part of the week. She began to notice a rash on Thursday. She describes the rash as white bumps with pus in them on her face, hairline, and temple areas. The rash is itchy and burning to her. She had the hair removed today. She denies fever. She rates the itching/burning as moderate since she had the hair removed.  Has the patient traveled out of the country within the last 30 days? ---No  Does the patient require triage? ---Yes  Related visit to physician within the last 2 weeks? ---No  Does the PT have any chronic conditions? (i.e. diabetes, asthma, etc.) ---Yes  List chronic conditions. ---HTN, Hypercholesterolemia, Pre-Diabetes  Did the patient indicate they were pregnant? ---No     Guidelines    Guideline Title Affirmed Question Affirmed Notes  Rash or Redness - Localized [1] Looks infected (spreading redness, pus) AND [2] large red area (> 2 in. or 5 cm) pus in the bumps.   Final Disposition User   See Physician within 4 Hours (or PCP triage) Sherilyn Cooter, RN, Thurmond Butts    Comments  Appointment made for today at 4:00pm with Dr. Marga Melnick.   Referrals  REFERRED TO PCP OFFICE   Disagree/Comply: Comply

## 2014-12-29 NOTE — Progress Notes (Signed)
Pre visit review using our clinic review tool, if applicable. No additional management support is needed unless otherwise documented below in the visit note. 

## 2014-12-31 ENCOUNTER — Telehealth: Payer: Self-pay | Admitting: Internal Medicine

## 2014-12-31 ENCOUNTER — Encounter: Payer: 59 | Attending: Surgery | Admitting: Dietician

## 2014-12-31 ENCOUNTER — Telehealth: Payer: Self-pay | Admitting: *Deleted

## 2014-12-31 ENCOUNTER — Encounter: Payer: Self-pay | Admitting: Dietician

## 2014-12-31 ENCOUNTER — Other Ambulatory Visit: Payer: Self-pay | Admitting: Emergency Medicine

## 2014-12-31 DIAGNOSIS — Z713 Dietary counseling and surveillance: Secondary | ICD-10-CM | POA: Diagnosis not present

## 2014-12-31 DIAGNOSIS — Z6841 Body Mass Index (BMI) 40.0 and over, adult: Secondary | ICD-10-CM | POA: Diagnosis not present

## 2014-12-31 MED ORDER — HYDROXYZINE HCL 10 MG PO TABS: 10.0000 mg | ORAL_TABLET | Freq: Three times a day (TID) | ORAL | Status: DC | PRN

## 2014-12-31 NOTE — Progress Notes (Signed)
  Follow-up visit:  3 months Post-Operative Gastric sleeve Surgery  Medical Nutrition Therapy:  Appt start time: 405 end time:  425  Primary concerns today: Post-operative Bariatric Surgery Nutrition Management.  Janice Huang returns today having lost another 13.5 lbs. She is able to eat a little more meat at a time (3 oz) but still drinks a protein shake every day. Went to a rheumatologist for her gout flares and it has been better.  Surgery date: 09/21/14 Surgery type: Gastric sleeve [facility] weight at [facility]: 346 lbs on 07/11/14 Weight today: 281 lbs Weight change: 13.5 lbs Total weight lost: 65 lbs  Goal weight: 200 lbs  TANITA  BODY COMP RESULTS  08/10/14 10/06/14 11/17/14 12/31/14   BMI (kg/m^2) 52.6 47.8 45.4 43.4   Fat Mass (lbs) 204 183.5 156.5 148.5   Fat Free Mass (lbs) 137 126.5 138 132.5   Total Body Water (lbs) 100.5 92.5 101 97    Preferred Learning Style:   No preference indicated   Learning Readiness:   Ready  24-hr recall: B (8:30-9AM): Premier protein shake (15g)  Snk (9:45AM):   L (1PM): 3 oz of grilled or baked chicken and vegetables or salad (21g) Snk (2:45PM): rest of protein shake or lunch (15g)  D (6PM): 3 oz chicken or beef and beans (21g) Snk (PM):   Fluid intake: water, protein shake (64 oz per day) Estimated total protein intake: 70g  Medications: see list Supplementation: taking   Using straws: no Drinking while eating: no Hair loss: no Carbonated beverages: no, took sips of ginger ale N/V/D/C: none Dumping syndrome: none  Recent physical activity:  Walking 15 minutes in mornings and evenings  Progress Towards Goal(s):  In progress.   Nutritional Diagnosis:  [place]-3.3 Overweight/obesity related to past poor dietary habits and physical inactivity as evidenced by patient w/ recent gastric sleeve surgery following dietary guidelines for continued weight loss.     Intervention:  Nutrition counseling provided.  Teaching Method Utilized:   Visual Auditory Hands on  Barriers to learning/adherence to lifestyle change: gout flares  Demonstrated degree of understanding via:  Teach Back   Monitoring/Evaluation:  Dietary intake, exercise, and body weight. Follow up in 3.5 months for 6 month post-op visit.

## 2014-12-31 NOTE — Telephone Encounter (Signed)
[facility] - Client TELEPHONE ADVICE RECORD [facility] Patient Name: Janice Huang Gender: Female DOB: Feb 14, 1971 Age: 44 Y 3 M 30 D Return Phone Number: [phone] (Primary) Address: [address] City/State/Zip: [place] [address] Client [facility] - Client Client Site [facility] Day Physician Sanda Linger Contact Type Call Call Type Triage / Clinical Relationship To Patient Self Appointment Disposition EMR Appointment Scheduled Info pasted into Epic Yes Return Phone Number [phone] (Primary) Chief Complaint Rash - Widespread Initial Comment Caller states she is having allergic reaction to possible hair used for her braids. Has rash around neck, face, and temple. PreDisposition Home Care Nurse Assessment Nurse: Sherilyn Cooter, RN, Thurmond Butts Date/Time Lamount Cohen Time): 12/29/2014 1:45:22 PM Confirm and document reason for call. If symptomatic, describe symptoms. ---Caller states that she had some braids with hair put in at the early part of the week. She began to notice a rash on Thursday. She describes the rash as white bumps with pus in them on her face, hairline, and temple areas. The rash is itchy and burning to her. She had the hair removed today. She denies fever. She rates the itching/burning as moderate since she had the hair removed. Has the patient traveled out of the country within the last 30 days? ---No Does the patient require triage? ---Yes Related visit to physician within the last 2 weeks? ---No Does the PT have any chronic conditions? (i.e. diabetes, asthma, etc.) ---Yes List chronic conditions. ---HTN, Hypercholesterolemia, Pre-Diabetes Did the patient indicate they were pregnant? ---No Guidelines Guideline Title Affirmed Question Affirmed Notes Nurse Date/Time (Eastern Time) Rash or Redness - Localized [1] Looks infected (spreading redness, pus) AND [2] large red area  (> 2 in. or 5 cm) pus in the bumps. Sherilyn Cooter, RN, Thurmond Butts 12/29/2014 1:48:57 PM Disp. Time Lamount Cohen Time) Disposition Final User 12/29/2014 1:50:30 PM See Physician within 4 Hours (or PCP triage) Yes Sherilyn Cooter, RN, Thurmond Butts PLEASE NOTE: All timestamps contained within this report are represented as [place] Standard Time. CONFIDENTIALTY NOTICE: This fax transmission is intended only for the addressee. It contains information that is legally privileged, confidential or otherwise protected from use or disclosure. If you are not the intended recipient, you are strictly prohibited from reviewing, disclosing, copying using or disseminating any of this information or taking any action in reliance on or regarding this information. If you have received this fax in error, please notify us immediately by telephone so that we can arrange for its return to [place]. Phone: [phone], Toll-Free: [phone], Fax: [phone] Page: 2 of 2 Call Id: [id number] Caller Understands: Yes Disagree/Comply: Comply Care Advice Given Per Guideline SEE PHYSICIAN WITHIN 4 HOURS (or PCP triage): * You become worse. After Care Instructions Given Call Event Type User Date / Time Description

## 2014-12-31 NOTE — Telephone Encounter (Signed)
RX has been sent. Informed pt.

## 2014-12-31 NOTE — Telephone Encounter (Signed)
Hydroxyzine 10 mg one every 8 hours as needed dispense 15

## 2014-12-31 NOTE — Telephone Encounter (Signed)
Pt called and wanted to know Hop could give her something for the itching.  She said that the rash is now itching and wants to know if there is something that will help with that?

## 2014-12-31 NOTE — Patient Instructions (Addendum)
Goals:  Follow Phase 3B: High Protein + Non-Starchy Vegetables  Eat 3-6 small meals/snacks, every 3-5 hrs  Increase lean protein foods to meet 60g goal  Increase fluid intake to 64oz +  Avoid drinking 15 minutes before, during and 30 minutes after eating  Aim for >30 min of physical activity daily  Proteins lower in purines: eggs, low fat cheese, yogurt, chicken and [place], beans, almonds (limit portions), Quest protein chips, edamame   Limit carbs like fruit or popcorn to 12 grams per meal or less (about 1/2 a cup)  Remember that protein is the priority!   TANITA  BODY COMP RESULTS  08/10/14 10/06/14 11/17/14 12/31/14   BMI (kg/m^2) 52.6 47.8 45.4 43.4   Fat Mass (lbs) 204 183.5 156.5 148.5   Fat Free Mass (lbs) 137 126.5 138 132.5   Total Body Water (lbs) 100.5 92.5 101 97      Foods high in potassium:  Vegetables  o Zucchini,  cup cooked has 280 mg potassium Legumes  o Soybeans,  cup cooked has 440 mg potassium o Lentils,  cup cooked has 370 mg potassium o Kidney beans,  cup cooked has 360 mg potassium o White beans, canned, drained, half cup: 595 mg Protein o Yogurt, fat-free, 1 cup: 579 mg o Halibut, 3 ounces, cooked: 490 mg o Pork tenderloin, 3 ounces, cooked: 382 mg o Milk, skim or soy, 8 ounces: 340-410 mg o Salmon, farmed Atlantic, 3 ounces, cooked: 326 mg o Chicken breast, 3 ounces, cooked: 218 mg o Tuna, light, canned, drained, 3 ounces: 201 mg

## 2014-12-31 NOTE — Telephone Encounter (Signed)
Please advise 

## 2015-01-01 ENCOUNTER — Telehealth: Payer: Self-pay | Admitting: Internal Medicine

## 2015-01-01 NOTE — Telephone Encounter (Signed)
Patient returned your call.

## 2015-01-06 NOTE — Telephone Encounter (Signed)
Left message for patient to call office. See referral notes for any further information regarding this referral.

## 2015-01-15 LAB — CBC AND DIFFERENTIAL
HCT: 36 % (ref 36–46)
Hemoglobin: 12.4 g/dL (ref 12.0–16.0)
Neutrophils Absolute: 3 /uL
Platelets: 225 10*3/uL (ref 150–399)
WBC: 6.5 10*3/mL

## 2015-01-15 LAB — HEPATIC FUNCTION PANEL
ALT: 27 U/L (ref 7–35)
AST: 24 U/L (ref 13–35)
Alkaline Phosphatase: 53 U/L (ref 25–125)
BILIRUBIN, TOTAL: 0.4 mg/dL

## 2015-01-15 LAB — BASIC METABOLIC PANEL
BUN: 15 mg/dL (ref 4–21)
Creatinine: 0.7 mg/dL (ref 0.5–1.1)
GLUCOSE: 92 mg/dL
Potassium: 4 mmol/L (ref 3.4–5.3)
Sodium: 138 mmol/L (ref 137–147)

## 2015-01-22 LAB — URIC ACID: URIC ACID: 4.2

## 2015-02-10 ENCOUNTER — Encounter: Payer: Self-pay | Admitting: Internal Medicine

## 2015-02-10 ENCOUNTER — Ambulatory Visit (INDEPENDENT_AMBULATORY_CARE_PROVIDER_SITE_OTHER): Payer: 59 | Admitting: Internal Medicine

## 2015-02-10 VITALS — BP 132/80 | HR 57 | Temp 97.1°F | Resp 16 | Ht 68.0 in | Wt 277.0 lb

## 2015-02-10 DIAGNOSIS — E119 Type 2 diabetes mellitus without complications: Secondary | ICD-10-CM | POA: Diagnosis not present

## 2015-02-10 DIAGNOSIS — Z9884 Bariatric surgery status: Secondary | ICD-10-CM | POA: Diagnosis not present

## 2015-02-10 DIAGNOSIS — E1169 Type 2 diabetes mellitus with other specified complication: Secondary | ICD-10-CM

## 2015-02-10 DIAGNOSIS — E669 Obesity, unspecified: Secondary | ICD-10-CM

## 2015-02-10 DIAGNOSIS — I10 Essential (primary) hypertension: Secondary | ICD-10-CM

## 2015-02-10 MED ORDER — LISINOPRIL 20 MG PO TABS: 20.0000 mg | ORAL_TABLET | Freq: Every day | ORAL | Status: DC

## 2015-02-10 NOTE — Patient Instructions (Signed)

## 2015-02-10 NOTE — Progress Notes (Signed)
Subjective:  Patient ID: Janice Huang, female    DOB: 10-Dec-1970  Age: 44 y.o. MRN: [id number]  CC: Diabetes; Hypertension; and Hyperlipidemia   HPI Janice Huang presents for follow-up. Since I last saw her she has undergone bariatric surgery and tells me that she has lost about 70 pounds over the last 4 months. She feels well today and offers no complaints. She comes in for blood pressure check. She is not willing to do any lab work today.  Outpatient Prescriptions Prior to Visit  Medication Sig Dispense Refill  . acetaminophen (TYLENOL) 500 MG tablet Take 1,000 mg by mouth every 6 (six) hours as needed for moderate pain.    Marland Kitchen colchicine (COLCRYS) 0.6 MG tablet Take 1 tablet (0.6 mg total) by mouth 3 (three) times daily with meals. (Patient taking differently: Take 0.6 mg by mouth daily. ) 270 tablet 1  . Multiple Vitamin (MULTIVITAMIN WITH MINERALS) TABS tablet Take 1 tablet by mouth every morning.    Marland Kitchen allopurinol (ZYLOPRIM) 100 MG tablet Take 1 tablet (100 mg total) by mouth daily. 30 tablet 11  . doxycycline (VIBRA-TABS) 100 MG tablet Take 1 tablet (100 mg total) by mouth 2 (two) times daily. 14 tablet 0  . hydrOXYzine (ATARAX/VISTARIL) 10 MG tablet Take 1 tablet (10 mg total) by mouth every 8 (eight) hours as needed. 15 tablet 0  . lidocaine (LIDODERM) 5 % Place 1 patch onto the skin daily. Remove & Discard patch within 12 hours or as directed by MD 10 patch 1  . lisinopril (PRINIVIL,ZESTRIL) 20 MG tablet Take 1 tablet (20 mg total) by mouth daily. 30 tablet 2  . mupirocin ointment (BACTROBAN) 2 % Applied twice a day to the affected area;NOT into eyes. 30 g 0  . traMADol (ULTRAM) 50 MG tablet Take 1 tablet (50 mg total) by mouth every 8 (eight) hours as needed. 65 tablet 2   No facility-administered medications prior to visit.    ROS Review of Systems  Constitutional: Negative.  Negative for fever, chills, diaphoresis, appetite change and fatigue.  HENT: Negative.    Eyes: Negative.   Respiratory: Negative.  Negative for cough, choking, chest tightness, shortness of breath and stridor.   Cardiovascular: Negative.  Negative for chest pain, palpitations and leg swelling.  Gastrointestinal: Negative.  Negative for nausea, vomiting, abdominal pain, diarrhea and constipation.  Endocrine: Negative.  Negative for polydipsia, polyphagia and polyuria.  Genitourinary: Negative.  Negative for dysuria, urgency, frequency and difficulty urinating.  Musculoskeletal: Negative.   Skin: Negative.  Negative for rash.  Allergic/Immunologic: Negative.   Neurological: Negative.  Negative for dizziness.  Hematological: Negative.  Negative for adenopathy. Does not bruise/bleed easily.  Psychiatric/Behavioral: Negative.     Objective:  BP 132/80 mmHg  Pulse 57  Temp(Src) 97.1 F (36.2 C) (Oral)  Resp 16  Ht  (1.727 m)  Wt 277 lb (125.646 kg)  BMI 42.13 kg/m2  SpO2 97%  BP Readings from Last 3 Encounters:  02/10/15 132/80  12/29/14 132/84  11/11/14 116/76    Wt Readings from Last 3 Encounters:  02/10/15 277 lb (125.646 kg)  12/31/14 281 lb (127.461 kg)  12/29/14 281 lb (127.461 kg)    Physical Exam  Constitutional: She is oriented to person, place, and time. No distress.  HENT:  Mouth/Throat: Oropharynx is clear and moist.  Eyes: Conjunctivae are normal. Right eye exhibits no discharge. Left eye exhibits no discharge. No scleral icterus.  Neck: Normal range of motion. Neck supple. No  JVD present. No tracheal deviation present. No thyromegaly present.  Cardiovascular: Normal rate, regular rhythm, normal heart sounds and intact distal pulses.  Exam reveals no gallop and no friction rub.   No murmur heard. Pulmonary/Chest: Effort normal and breath sounds normal. No stridor. No respiratory distress. She has no wheezes. She has no rales. She exhibits no tenderness.  Abdominal: Soft. Bowel sounds are normal. She exhibits no distension and no mass. There is  no tenderness. There is no rebound and no guarding.  Musculoskeletal: Normal range of motion. She exhibits no edema or tenderness.  Lymphadenopathy:    She has no cervical adenopathy.  Neurological: She is oriented to person, place, and time.  Skin: Skin is warm and dry. No rash noted. She is not diaphoretic. No erythema. No pallor.  Psychiatric: She has a normal mood and affect. Her behavior is normal. Judgment and thought content normal.  Vitals reviewed.   Lab Results  Component Value Date   WBC 6.5 01/15/2015   HGB 12.4 01/15/2015   HCT 36 01/15/2015   PLT 225 01/15/2015   GLUCOSE 117* 09/14/2014   CHOL 249* 05/11/2014   TRIG 128.0 05/11/2014   HDL 39.60 05/11/2014   LDLDIRECT 217.2 05/23/2013   LDLCALC 184* 05/11/2014   ALT 27 01/15/2015   AST 24 01/15/2015   NA 138 01/15/2015   K 4.0 01/15/2015   CL 101 09/14/2014   CREATININE 0.7 01/15/2015   BUN 15 01/15/2015   CO2 28 09/14/2014   TSH 1.28 05/11/2014   HGBA1C 6.6* 05/11/2014    Mm Digital Screening Bilateral  09/21/2014   CLINICAL DATA:  Screening.  EXAM: DIGITAL SCREENING BILATERAL MAMMOGRAM WITH CAD  COMPARISON:  Previous exam(s).  ACR Breast Density Category b: There are scattered areas of fibroglandular density.  FINDINGS: There are no findings suspicious for malignancy. Images were processed with CAD.  IMPRESSION: No mammographic evidence of malignancy. A result letter of this screening mammogram will be mailed directly to the patient.  RECOMMENDATION: Screening mammogram in one year. (Code:SM-B-01Y)  BI-RADS CATEGORY  1: Negative.   Electronically Signed   By: Britta Mccreedy M.D.   On: 09/21/2014 16:29    Assessment & Plan:   Janice Huang was seen today for diabetes, hypertension and hyperlipidemia.  Diagnoses and all orders for this visit:  Essential hypertension- her blood pressure is well controlled, no lab work was done today at her request. -     lisinopril (PRINIVIL,ZESTRIL) 20 MG tablet; Take 1 tablet (20  mg total) by mouth daily.  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 Nonetheless, she is not willing to have an A1c done today. I will get this done during her next follow-up visit with me.   I have discontinued Ms. Callins's lidocaine, traMADol, doxycycline, mupirocin ointment, and hydrOXYzine. I am also having her maintain her acetaminophen, multivitamin with minerals, colchicine, allopurinol, and lisinopril.  Meds ordered this encounter  Medications  . allopurinol (ZYLOPRIM) 300 MG tablet    Sig:   . lisinopril (PRINIVIL,ZESTRIL) 20 MG tablet    Sig: Take 1 tablet (20 mg total) by mouth daily.    Dispense:  90 tablet    Refill:  1     Follow-up: Return in about 4 months (around 06/12/2015).  Sanda Linger, MD

## 2015-02-10 NOTE — Progress Notes (Signed)
Pre visit review using our clinic review tool, if applicable. No additional management support is needed unless otherwise documented below in the visit note. 

## 2015-02-11 ENCOUNTER — Ambulatory Visit: Payer: 59 | Admitting: Internal Medicine

## 2015-04-07 ENCOUNTER — Encounter: Payer: Self-pay | Admitting: Dietician

## 2015-04-07 ENCOUNTER — Encounter: Payer: 59 | Attending: Internal Medicine | Admitting: Dietician

## 2015-04-07 DIAGNOSIS — Z6841 Body Mass Index (BMI) 40.0 and over, adult: Secondary | ICD-10-CM | POA: Insufficient documentation

## 2015-04-07 DIAGNOSIS — Z713 Dietary counseling and surveillance: Secondary | ICD-10-CM | POA: Diagnosis not present

## 2015-04-07 NOTE — Patient Instructions (Addendum)
Goals:  Follow Phase 3B: High Protein + Non-Starchy Vegetables  Eat 3-6 small meals/snacks, every 3-5 hrs  Increase lean protein foods to meet 60g goal  Increase fluid intake to 64oz +  Avoid drinking 15 minutes before, during and 30 minutes after eating  Aim for >30 min of physical activity daily  Proteins lower in purines: eggs, low fat cheese, yogurt, chicken and [place]key, beans, almonds (limit portions), [employer] protein chips, edamame   Limit carbs like fruit or popcorn to 12 grams per meal or less (about 1/2 a cup)  Remember that protein is the priority!  Increase exercise!  Keep focusing on non scale victories: better energy and stamina, clothing size, easier to put shoes on, feeling comfortable putting on shoes, traveling, better fitting clothes   [facility]ANITA  BODY COMP RESULTS  08/10/14 10/06/14 11/17/14 12/31/14 04/07/15   BMI (kg/m^2) 52.6 47.8 45.4 43.4 42.7   Fat Mass (lbs) 204 183.5 156.5 148.5 123.5   Fat Free Mass (lbs) 137 126.5 138 132.5 153.5   Total Body Water (lbs) 100.5 92.5 101 97 112.5     Foods high in potassium:  Vegetables  o Zucchini,  cup cooked has 280 mg potassium Legumes  o Soybeans,  cup cooked has 440 mg potassium o Lentils,  cup cooked has 370 mg potassium o Kidney beans,  cup cooked has 360 mg potassium o White beans, canned, drained, half cup: 595 mg Protein o Yogurt, fat-free, 1 cup: 579 mg o Halibut, 3 ounces, cooked: 490 mg o Pork tenderloin, 3 ounces, cooked: 382 mg o Milk, skim or soy, 8 ounces: 340-410 mg o Salmon, farmed Atlantic, 3 ounces, cooked: 326 mg o Chicken breast, 3 ounces, cooked: 218 mg o Tuna, light, canned, drained, 3 ounces: 201 mg

## 2015-04-07 NOTE — Progress Notes (Signed)
  Follow-up visit:  6 months Post-Operative Gastric sleeve Surgery  Medical Nutrition Therapy:  Appt start time: 455 end time:  515  Primary concerns today: Post-operative Bariatric Surgery Nutrition Management.  Janice SpainJacquetta returns today having lost another 4 lbs. Per [place] Body Composition scale, she has had significant fluid gain; this could possibly be due to error. States that she is longer taking blood pressure pill with diuretic. Has gone down several pants sizes. Does not feel like ankles are swelling. No longer having protein shakes because they cause gout flares. She does not weigh herself at home. Occasionally having some carbs like popcorn and fruit.   Surgery date: 09/21/14 Surgery type: Gastric sleeve [facility]: 346 lbs on 07/11/14 Weight today: 277 lbs Weight change: 4 lbs Total weight lost: 69 lbs  Goal weight: 200 lbs  TANITA  BODY COMP RESULTS  08/10/14 10/06/14 11/17/14 12/31/14 04/07/15   BMI (kg/m^2) 52.6 47.8 45.4 43.4 42.7   Fat Mass (lbs) 204 183.5 156.5 148.5 123.5   Fat Free Mass (lbs) 137 126.5 138 132.5 153.5   Total Body Water (lbs) 100.5 92.5 101 97 112.5    Preferred Learning Style:   No preference indicated   Learning Readiness:   Ready  24-hr recall: B (8:30-9AM): 2 pieces [place]key bacon and boiled egg (10g) Snk (AM):   L (1PM): grilled chicken salad (21g) Snk (2:45PM): rest of lunch  D (6PM): 1.5 oz chicken and vegetables (12g) Snk (PM):   Fluid intake: water, occasionally a few oz ginger ale (64 oz per day) Estimated total protein intake: ~60g per day per patient  Medications: see list Supplementation: taking   Using straws: no Drinking while eating: no Hair loss: no Carbonated beverages: sips of ginger ale "when I need to burp" N/V/D/C: none Dumping syndrome: none  Recent physical activity:  Not walking as much  Progress Towards Goal(s):  In progress.   Nutritional Diagnosis:  [place]-3.3 Overweight/obesity related to past  poor dietary habits and physical inactivity as evidenced by patient w/ recent gastric sleeve surgery following dietary guidelines for continued weight loss.     Intervention:  Nutrition counseling provided.  Teaching Method Utilized:  Visual Auditory Hands on  Barriers to learning/adherence to lifestyle change: gout flares  Demonstrated degree of understanding via:  Teach Back   Monitoring/Evaluation:  Dietary intake, exercise, and body weight. Follow up in 3 months for 9 month post-op visit.

## 2015-04-08 ENCOUNTER — Telehealth: Payer: Self-pay | Admitting: Internal Medicine

## 2015-04-08 NOTE — Telephone Encounter (Signed)
Patient had gastric bypass back in May and she visited her nutritionist yesterday and and her fluid intake was very high. Her nutritionist told her to give you call to let you know. Please advise pt if anything needs to be done.

## 2015-04-08 NOTE — Telephone Encounter (Signed)
She needs to be seen for labs

## 2015-04-09 NOTE — Telephone Encounter (Signed)
appt made

## 2015-04-14 ENCOUNTER — Ambulatory Visit (INDEPENDENT_AMBULATORY_CARE_PROVIDER_SITE_OTHER): Payer: 59 | Admitting: Internal Medicine

## 2015-04-14 ENCOUNTER — Encounter: Payer: Self-pay | Admitting: Internal Medicine

## 2015-04-14 ENCOUNTER — Other Ambulatory Visit (INDEPENDENT_AMBULATORY_CARE_PROVIDER_SITE_OTHER): Payer: 59

## 2015-04-14 VITALS — BP 112/82 | HR 56 | Temp 98.3°F | Resp 16 | Ht 67.0 in | Wt 275.0 lb

## 2015-04-14 DIAGNOSIS — E1169 Type 2 diabetes mellitus with other specified complication: Secondary | ICD-10-CM

## 2015-04-14 DIAGNOSIS — E119 Type 2 diabetes mellitus without complications: Secondary | ICD-10-CM

## 2015-04-14 DIAGNOSIS — E669 Obesity, unspecified: Secondary | ICD-10-CM

## 2015-04-14 DIAGNOSIS — I10 Essential (primary) hypertension: Secondary | ICD-10-CM | POA: Diagnosis not present

## 2015-04-14 DIAGNOSIS — E785 Hyperlipidemia, unspecified: Secondary | ICD-10-CM | POA: Diagnosis not present

## 2015-04-14 LAB — URINALYSIS, ROUTINE W REFLEX MICROSCOPIC
BILIRUBIN URINE: NEGATIVE
Hgb urine dipstick: NEGATIVE
Leukocytes, UA: NEGATIVE
Nitrite: NEGATIVE
PH: 6 (ref 5.0–8.0)
SPECIFIC GRAVITY, URINE: 1.025 (ref 1.000–1.030)
TOTAL PROTEIN, URINE-UPE24: NEGATIVE
UROBILINOGEN UA: 1 (ref 0.0–1.0)
Urine Glucose: NEGATIVE

## 2015-04-14 LAB — LIPID PANEL
CHOL/HDL RATIO: 5
Cholesterol: 254 mg/dL — ABNORMAL HIGH (ref 0–200)
HDL: 51.6 mg/dL (ref >39.00)
LDL CALC: 186 mg/dL — AB (ref 0–99)
NONHDL: 202.61
TRIGLYCERIDES: 85 mg/dL (ref 0.0–149.0)
VLDL: 17 mg/dL (ref 0.0–40.0)

## 2015-04-14 LAB — BASIC METABOLIC PANEL
BUN: 8 mg/dL (ref 6–23)
CO2: 24 meq/L (ref 19–32)
Calcium: 9.4 mg/dL (ref 8.4–10.5)
Chloride: 104 mEq/L (ref 96–112)
Creatinine, Ser: 0.68 mg/dL (ref 0.40–1.20)
GFR: 120.54 mL/min (ref >60.00)
GLUCOSE: 82 mg/dL (ref 70–99)
POTASSIUM: 3.9 meq/L (ref 3.5–5.1)
SODIUM: 138 meq/L (ref 135–145)

## 2015-04-14 LAB — HEMOGLOBIN A1C: Hgb A1c MFr Bld: 5.7 % (ref 4.6–6.5)

## 2015-04-14 LAB — MICROALBUMIN / CREATININE URINE RATIO
Creatinine,U: 224.7 mg/dL
MICROALB/CREAT RATIO: 1.2 mg/g (ref 0.0–30.0)
Microalb, Ur: 2.8 mg/dL — ABNORMAL HIGH (ref 0.0–1.9)

## 2015-04-14 LAB — TSH: TSH: 0.9 u[IU]/mL (ref 0.35–4.50)

## 2015-04-14 NOTE — Progress Notes (Signed)
Pre visit review using our clinic review tool, if applicable. No additional management support is needed unless otherwise documented below in the visit note. 

## 2015-04-14 NOTE — Progress Notes (Signed)
Subjective:  Patient ID: Janice Huang, female    DOB: 1971/02/02  Age: 44 y.o. MRN: [id number]  CC: Hypertension; Hyperlipidemia; and Diabetes   HPI Janice Huang presents for f/up - she has lost weight s/p bypass surgery, there has been concern about her body water content by her nutritionist but she feels well with no edema, SOB, DOE, CP, or fatigue.  Outpatient Prescriptions Prior to Visit  Medication Sig Dispense Refill  . acetaminophen (TYLENOL) 500 MG tablet Take 1,000 mg by mouth every 6 (six) hours as needed for moderate pain.    Marland Kitchen allopurinol (ZYLOPRIM) 300 MG tablet     . colchicine (COLCRYS) 0.6 MG tablet Take 1 tablet (0.6 mg total) by mouth 3 (three) times daily with meals. (Patient taking differently: Take 0.6 mg by mouth daily. ) 270 tablet 1  . lisinopril (PRINIVIL,ZESTRIL) 20 MG tablet Take 1 tablet (20 mg total) by mouth daily. 90 tablet 1  . Multiple Vitamin (MULTIVITAMIN WITH MINERALS) TABS tablet Take 1 tablet by mouth every morning.     No facility-administered medications prior to visit.    ROS Review of Systems  Constitutional: Negative.  Negative for fever, chills, diaphoresis, appetite change and fatigue.  HENT: Negative.   Eyes: Negative.   Respiratory: Negative.  Negative for cough, choking, chest tightness, shortness of breath and stridor.   Cardiovascular: Negative.  Negative for chest pain, palpitations and leg swelling.  Gastrointestinal: Negative.  Negative for nausea, vomiting, abdominal pain, diarrhea, constipation and blood in stool.  Endocrine: Negative.  Negative for polydipsia, polyphagia and polyuria.  Genitourinary: Negative.   Musculoskeletal: Negative.   Skin: Negative.  Negative for rash.  Allergic/Immunologic: Negative.   Neurological: Negative.  Negative for dizziness, tremors, syncope, light-headedness and numbness.  Hematological: Negative.  Negative for adenopathy. Does not bruise/bleed easily.    Psychiatric/Behavioral: Negative.     Objective:  BP 112/82 mmHg  Pulse 56  Temp(Src) 98.3 F (36.8 C) (Oral)  Ht  (1.702 m)  Wt 275 lb (124.739 kg)  BMI 43.06 kg/m2  SpO2 97%  BP Readings from Last 3 Encounters:  04/14/15 112/82  02/10/15 132/80  12/29/14 132/84    Wt Readings from Last 3 Encounters:  04/14/15 275 lb (124.739 kg)  04/07/15 277 lb (125.646 kg)  02/10/15 277 lb (125.646 kg)    Physical Exam  Constitutional: She is oriented to person, place, and time. No distress.  HENT:  Mouth/Throat: Oropharynx is clear and moist. No oropharyngeal exudate.  Eyes: Conjunctivae are normal. Right eye exhibits no discharge. Left eye exhibits no discharge. No scleral icterus.  Neck: Normal range of motion. Neck supple. No JVD present. No tracheal deviation present. No thyromegaly present.  Cardiovascular: Normal rate, regular rhythm, normal heart sounds and intact distal pulses.  Exam reveals no gallop and no friction rub.   No murmur heard. Pulmonary/Chest: Effort normal and breath sounds normal. No stridor. No respiratory distress. She has no wheezes. She has no rales. She exhibits no tenderness.  Abdominal: Soft. Bowel sounds are normal. She exhibits no distension and no mass. There is no tenderness. There is no rebound and no guarding.  Musculoskeletal: She exhibits no edema or tenderness.  Lymphadenopathy:    She has no cervical adenopathy.  Neurological: She is oriented to person, place, and time.  Skin: Skin is warm and dry. No rash noted. She is not diaphoretic. No erythema. No pallor.  Vitals reviewed.   Lab Results  Component Value Date  WBC 6.5 01/15/2015   HGB 12.4 01/15/2015   HCT 36 01/15/2015   PLT 225 01/15/2015   GLUCOSE 117* 09/14/2014   CHOL 249* 05/11/2014   TRIG 128.0 05/11/2014   HDL 39.60 05/11/2014   LDLDIRECT 217.2 05/23/2013   LDLCALC 184* 05/11/2014   ALT 27 01/15/2015   AST 24 01/15/2015   NA 138 01/15/2015   K 4.0 01/15/2015    CL 101 09/14/2014   CREATININE 0.7 01/15/2015   BUN 15 01/15/2015   CO2 28 09/14/2014   TSH 1.28 05/11/2014   HGBA1C 6.6* 05/11/2014    Mm Digital Screening Bilateral  09/21/2014  CLINICAL DATA:  Screening. EXAM: DIGITAL SCREENING BILATERAL MAMMOGRAM WITH CAD COMPARISON:  Previous exam(s). ACR Breast Density Category b: There are scattered areas of fibroglandular density. FINDINGS: There are no findings suspicious for malignancy. Images were processed with CAD. IMPRESSION: No mammographic evidence of malignancy. A result letter of this screening mammogram will be mailed directly to the patient. RECOMMENDATION: Screening mammogram in one year. (Code:SM-B-01Y) BI-RADS CATEGORY  1: Negative. Electronically Signed   By: Britta MccreedySusan  Turner M.D.   On: 09/21/2014 16:29    Assessment & Plan:   [name] was seen today for hypertension, hyperlipidemia and diabetes.  Diagnoses and all orders for this visit:  Essential hypertension, benign- I don't see any concerns with "fluid content" and at this time I do not recommend that she start a diuretic since her BP is on the low side, will monitor her lytes and renal function -     Urinalysis, Routine w reflex microscopic (not at [facility]); Future -     Basic metabolic panel; Future  Diabetes mellitus type 2 in obese (HCC)- to date, her blood sugar has been well controlled, will recheck her A1c and will treat if indicated -     Hemoglobin A1c; Future -     Basic metabolic panel; Future -     Microalbumin / creatinine urine ratio; Future  Hyperlipidemia with target LDL less than 100- will recheck her FLP, will start a statin if indicated -     Lipid panel; Future -     TSH; Future  I am having [name] maintain her acetaminophen, multivitamin with minerals, colchicine, allopurinol, and lisinopril.  No orders of the defined types were placed in this encounter.     Follow-up: Return in about 4 months (around 08/12/2015).  Sanda Lingerhomas Briane Birden, MD

## 2015-04-14 NOTE — Patient Instructions (Signed)

## 2015-04-15 ENCOUNTER — Encounter: Payer: Self-pay | Admitting: Internal Medicine

## 2015-05-21 ENCOUNTER — Telehealth: Payer: Self-pay | Admitting: Internal Medicine

## 2015-05-21 DIAGNOSIS — M1000 Idiopathic gout, unspecified site: Secondary | ICD-10-CM

## 2015-05-21 NOTE — Telephone Encounter (Signed)
Pt states she is unhappy with her rheumatologist Dr. Kellie Simmeringruslow and she would like a referral to a new one. She can be reached at [phone]

## 2015-05-26 NOTE — Telephone Encounter (Signed)
Referral made 

## 2015-06-09 ENCOUNTER — Encounter: Payer: Self-pay | Admitting: Internal Medicine

## 2015-06-09 ENCOUNTER — Ambulatory Visit (INDEPENDENT_AMBULATORY_CARE_PROVIDER_SITE_OTHER): Payer: 59 | Admitting: Internal Medicine

## 2015-06-09 VITALS — BP 120/80 | HR 61 | Temp 98.7°F | Resp 16 | Ht 67.0 in | Wt 278.0 lb

## 2015-06-09 DIAGNOSIS — R7303 Prediabetes: Secondary | ICD-10-CM | POA: Diagnosis not present

## 2015-06-09 DIAGNOSIS — E785 Hyperlipidemia, unspecified: Secondary | ICD-10-CM

## 2015-06-09 DIAGNOSIS — M10072 Idiopathic gout, left ankle and foot: Secondary | ICD-10-CM

## 2015-06-09 DIAGNOSIS — I10 Essential (primary) hypertension: Secondary | ICD-10-CM

## 2015-06-09 DIAGNOSIS — M1000 Idiopathic gout, unspecified site: Secondary | ICD-10-CM | POA: Diagnosis not present

## 2015-06-09 MED ORDER — ALLOPURINOL 300 MG PO TABS: 300.0000 mg | ORAL_TABLET | Freq: Every day | ORAL | Status: DC

## 2015-06-09 MED ORDER — LISINOPRIL 20 MG PO TABS: 20.0000 mg | ORAL_TABLET | Freq: Every day | ORAL | Status: DC

## 2015-06-09 MED ORDER — COLCHICINE 0.6 MG PO TABS: 0.6000 mg | ORAL_TABLET | Freq: Every day | ORAL | Status: DC

## 2015-06-09 NOTE — Progress Notes (Signed)
Subjective:  Patient ID: Janice Huang, female    DOB: 12/16/1970  Age: 45 y.o. MRN: [id number]  CC: Hypertension and Hyperlipidemia   HPI Janice Huang presents for follow-up, she feels well and offers no complaints.  Outpatient Prescriptions Prior to Visit  Medication Sig Dispense Refill  . acetaminophen (TYLENOL) 500 MG tablet Take 1,000 mg by mouth every 6 (six) hours as needed for moderate pain.    . Multiple Vitamin (MULTIVITAMIN WITH MINERALS) TABS tablet Take 1 tablet by mouth every morning.    Marland Kitchen allopurinol (ZYLOPRIM) 300 MG tablet     . colchicine (COLCRYS) 0.6 MG tablet Take 1 tablet (0.6 mg total) by mouth 3 (three) times daily with meals. (Patient taking differently: Take 0.6 mg by mouth daily. ) 270 tablet 1  . lisinopril (PRINIVIL,ZESTRIL) 20 MG tablet Take 1 tablet (20 mg total) by mouth daily. 90 tablet 1   No facility-administered medications prior to visit.    ROS Review of Systems  Constitutional: Negative.  Negative for chills, diaphoresis and fatigue.  HENT: Negative.   Eyes: Negative.   Respiratory: Negative.  Negative for cough, choking, shortness of breath and stridor.   Cardiovascular: Negative.  Negative for chest pain, palpitations and leg swelling.  Gastrointestinal: Negative.  Negative for nausea, vomiting, abdominal pain, diarrhea and constipation.  Endocrine: Negative.   Genitourinary: Negative.   Musculoskeletal: Negative.   Skin: Negative.   Allergic/Immunologic: Negative.   Neurological: Negative.   Hematological: Negative.   Psychiatric/Behavioral: Negative.   All other systems reviewed and are negative.   Objective:  BP 120/80 mmHg  Pulse 61  Temp(Src) 98.7 F (37.1 C) (Oral)  Resp 16  Ht  (1.702 m)  Wt 278 lb (126.1 kg)  BMI 43.53 kg/m2  SpO2 98%  BP Readings from Last 3 Encounters:  06/09/15 120/80  04/14/15 112/82  02/10/15 132/80    Wt Readings from Last 3 Encounters:  06/09/15 278 lb (126.1 kg)    04/14/15 275 lb (124.739 kg)  04/07/15 277 lb (125.646 kg)    Physical Exam  Constitutional: She is oriented to person, place, and time. No distress.  HENT:  Head: Normocephalic and atraumatic.  Mouth/Throat: Oropharynx is clear and moist. No oropharyngeal exudate.  Eyes: Conjunctivae are normal. Right eye exhibits no discharge. Left eye exhibits no discharge. No scleral icterus.  Neck: Normal range of motion. Neck supple. No JVD present. No tracheal deviation present. No thyromegaly present.  Cardiovascular: Normal rate, regular rhythm, normal heart sounds and intact distal pulses.  Exam reveals no gallop and no friction rub.   No murmur heard. Pulmonary/Chest: Effort normal and breath sounds normal. No stridor. No respiratory distress. She has no wheezes. She has no rales. She exhibits no tenderness.  Abdominal: Soft. Bowel sounds are normal. She exhibits no distension and no mass. There is no tenderness. There is no rebound and no guarding.  Musculoskeletal: Normal range of motion. She exhibits no edema or tenderness.  Lymphadenopathy:    She has no cervical adenopathy.  Neurological: She is oriented to person, place, and time.  Skin: Skin is warm and dry. No rash noted. She is not diaphoretic. No erythema. No pallor.  Vitals reviewed.   Lab Results  Component Value Date   WBC 6.5 01/15/2015   HGB 12.4 01/15/2015   HCT 36 01/15/2015   PLT 225 01/15/2015   GLUCOSE 82 04/14/2015   CHOL 254* 04/14/2015   TRIG 85.0 04/14/2015   HDL 51.60 04/14/2015  LDLDIRECT 217.2 05/23/2013   LDLCALC 186* 04/14/2015   ALT 27 01/15/2015   AST 24 01/15/2015   NA 138 04/14/2015   K 3.9 04/14/2015   CL 104 04/14/2015   CREATININE 0.68 04/14/2015   BUN 8 04/14/2015   CO2 24 04/14/2015   TSH 0.90 04/14/2015   HGBA1C 5.7 04/14/2015   MICROALBUR 2.8* 04/14/2015    Mm Digital Screening Bilateral  09/21/2014  CLINICAL DATA:  Screening. EXAM: DIGITAL SCREENING BILATERAL MAMMOGRAM WITH CAD  COMPARISON:  Previous exam(s). ACR Breast Density Category b: There are scattered areas of fibroglandular density. FINDINGS: There are no findings suspicious for malignancy. Images were processed with CAD. IMPRESSION: No mammographic evidence of malignancy. A result letter of this screening mammogram will be mailed directly to the patient. RECOMMENDATION: Screening mammogram in one year. (Code:SM-B-01Y) BI-RADS CATEGORY  1: Negative. Electronically Signed   By: Britta Mccreedy M.D.   On: 09/21/2014 16:29    Assessment & Plan:   Janice Huang was seen today for hypertension and hyperlipidemia.  Diagnoses and all orders for this visit:  Essential hypertension - her blood pressures well controlled -     lisinopril (PRINIVIL,ZESTRIL) 20 MG tablet; Take 1 tablet (20 mg total) by mouth daily.  Idiopathic gout, unspecified chronicity, unspecified site- this is well-controlled, she has had no recent flares -     colchicine (COLCRYS) 0.6 MG tablet; Take 1 tablet (0.6 mg total) by mouth daily. -     allopurinol (ZYLOPRIM) 300 MG tablet; Take 1 tablet (300 mg total) by mouth daily.  Acute idiopathic gout of left foot  Hyperlipidemia with target LDL less than 100- he has achieved her LDL goal  Prediabetes- this is markedly improved after her recent bariatric surgery  Essential hypertension, benign   I have changed Janice Huang's colchicine and allopurinol. I am also having her maintain her acetaminophen, multivitamin with minerals, and lisinopril.  Meds ordered this encounter  Medications  . lisinopril (PRINIVIL,ZESTRIL) 20 MG tablet    Sig: Take 1 tablet (20 mg total) by mouth daily.    Dispense:  90 tablet    Refill:  1    Please distribute 90 day supply to pt as requested  . colchicine (COLCRYS) 0.6 MG tablet    Sig: Take 1 tablet (0.6 mg total) by mouth daily.    Dispense:  270 tablet    Refill:  1  . allopurinol (ZYLOPRIM) 300 MG tablet    Sig: Take 1 tablet (300 mg total) by mouth daily.      Dispense:  90 tablet    Refill:  1     Follow-up: Return in about 6 months (around 12/07/2015).  Sanda Linger, MD

## 2015-06-09 NOTE — Progress Notes (Signed)
Pre visit review using our clinic review tool, if applicable. No additional management support is needed unless otherwise documented below in the visit note. 

## 2015-06-09 NOTE — Patient Instructions (Signed)
Hypertension Hypertension, commonly called high blood pressure, is when the force of blood pumping through your arteries is too strong. Your arteries are the blood vessels that carry blood from your heart throughout your body. A blood pressure reading consists of a higher number over a lower number, such as 110/72. The higher number (systolic) is the pressure inside your arteries when your heart pumps. The lower number (diastolic) is the pressure inside your arteries when your heart relaxes. Ideally you want your blood pressure below 120/80. Hypertension forces your heart to work harder to pump blood. Your arteries may become narrow or stiff. Having untreated or uncontrolled hypertension can cause heart attack, stroke, kidney disease, and other problems. RISK FACTORS Some risk factors for high blood pressure are controllable. Others are not.  Risk factors you cannot control include:   Race. You may be at higher risk if you are African American.  Age. Risk increases with age.  Gender. Men are at higher risk than women before age 45 years. After age 65, women are at higher risk than men. Risk factors you can control include:  Not getting enough exercise or physical activity.  Being overweight.  Getting too much fat, sugar, calories, or salt in your diet.  Drinking too much alcohol. SIGNS AND SYMPTOMS Hypertension does not usually cause signs or symptoms. Extremely high blood pressure (hypertensive crisis) may cause headache, anxiety, shortness of breath, and nosebleed. DIAGNOSIS To check if you have hypertension, your health care provider will measure your blood pressure while you are seated, with your arm held at the level of your heart. It should be measured at least twice using the same arm. Certain conditions can cause a difference in blood pressure between your right and left arms. A blood pressure reading that is higher than normal on one occasion does not mean that you need treatment. If  it is not clear whether you have high blood pressure, you may be asked to return on a different day to have your blood pressure checked again. Or, you may be asked to monitor your blood pressure at home for 1 or more weeks. TREATMENT Treating high blood pressure includes making lifestyle changes and possibly taking medicine. Living a healthy lifestyle can help lower high blood pressure. You may need to change some of your habits. Lifestyle changes may include:  Following the DASH diet. This diet is high in fruits, vegetables, and whole grains. It is low in salt, red meat, and added sugars.  Keep your sodium intake below 2,300 mg per day.  Getting at least 30-45 minutes of aerobic exercise at least 4 times per week.  Losing weight if necessary.  Not smoking.  Limiting alcoholic beverages.  Learning ways to reduce stress. Your health care provider may prescribe medicine if lifestyle changes are not enough to get your blood pressure under control, and if one of the following is true:  You are 18-59 years of age and your systolic blood pressure is above 140.  You are 60 years of age or older, and your systolic blood pressure is above 150.  Your diastolic blood pressure is above 90.  You have diabetes, and your systolic blood pressure is over 140 or your diastolic blood pressure is over 90.  You have kidney disease and your blood pressure is above 140/90.  You have heart disease and your blood pressure is above 140/90. Your personal target blood pressure may vary depending on your medical conditions, your age, and other factors. HOME CARE INSTRUCTIONS    Have your blood pressure rechecked as directed by your health care provider.   Take medicines only as directed by your health care provider. Follow the directions carefully. Blood pressure medicines must be taken as prescribed. The medicine does not work as well when you skip doses. Skipping doses also puts you at risk for  problems.  Do not smoke.   Monitor your blood pressure at home as directed by your health care provider. SEEK MEDICAL CARE IF:   You think you are having a reaction to medicines taken.  You have recurrent headaches or feel dizzy.  You have swelling in your ankles.  You have trouble with your vision. SEEK IMMEDIATE MEDICAL CARE IF:  You develop a severe headache or confusion.  You have unusual weakness, numbness, or feel faint.  You have severe chest or abdominal pain.  You vomit repeatedly.  You have trouble breathing. MAKE SURE YOU:   Understand these instructions.  Will watch your condition.  Will get help right away if you are not doing well or get worse.   This information is not intended to replace advice given to you by your health care provider. Make sure you discuss any questions you have with your health care provider.   Document Released: 05/08/2005 Document Revised: 09/22/2014 Document Reviewed: 02/28/2013 Elsevier Interactive Patient Education 2016 [employer]vier Inc.  

## 2015-06-16 ENCOUNTER — Ambulatory Visit: Payer: 59 | Admitting: Internal Medicine

## 2015-07-07 ENCOUNTER — Encounter: Payer: 59 | Attending: Surgery | Admitting: Dietician

## 2015-07-07 ENCOUNTER — Encounter: Payer: Self-pay | Admitting: Dietician

## 2015-07-07 DIAGNOSIS — Z029 Encounter for administrative examinations, unspecified: Secondary | ICD-10-CM | POA: Insufficient documentation

## 2015-07-07 NOTE — Patient Instructions (Addendum)
Goals:  Follow Phase 3B: High Protein + Non-Starchy Vegetables  Eat 3-6 small meals/snacks, every 3-5 hrs  Increase lean protein foods to meet 60g goal  Increase fluid intake to 64oz +  Avoid drinking 15 minutes before, during and 30 minutes after eating  Aim for >30 min of physical activity daily  Proteins lower in purines: eggs, low fat cheese, yogurt, chicken and [place], beans, almonds (limit portions), [employer] protein chips, edamame   Limit carbs like fruit or popcorn to 12 grams per meal or less (about 1/2 a cup)  Remember that protein is the priority!  Increase exercise!  Keep focusing on non scale victories: better energy and stamina, clothing size, easier to put shoes on, feeling comfortable putting on shoes, traveling, better fitting clothes  Avoid juice and ginger ale     Foods high in potassium:  Vegetables  o Zucchini,  cup cooked has 280 mg potassium Legumes  o Soybeans,  cup cooked has 440 mg potassium o Lentils,  cup cooked has 370 mg potassium o Kidney beans,  cup cooked has 360 mg potassium o White beans, canned, drained, half cup: 595 mg Protein o Yogurt, fat-free, 1 cup: 579 mg o Halibut, 3 ounces, cooked: 490 mg o Pork tenderloin, 3 ounces, cooked: 382 mg o Milk, skim or soy, 8 ounces: 340-410 mg o Salmon, farmed [place], 3 ounces, cooked: 326 mg o Chicken breast, 3 ounces, cooked: 218 mg o Tuna, light, canned, drained, 3 ounces: 201 mg

## 2015-07-07 NOTE — Progress Notes (Signed)
  Follow-up visit:  9 months Post-Operative Gastric sleeve Surgery  Medical Nutrition Therapy:  Appt start time: 455 end time: 515  Primary concerns today: Post-operative Bariatric Surgery Nutrition Management.  Janice Huang returns today having lost another 3.5 lbs. Suspected error in [place] Body Composition scale. She states she is discouraged with slowed weight loss. Does not think she is getting enough protein. Has had some minor gout flares and notices that they are worse when she has Premier protein shake. Plans to try GenePro powder.   Surgery date: 09/21/14 Surgery type: Gastric sleeve Start weight at [facility]: 346 lbs on 07/11/14 Weight today: 273.5 lbs Weight change: 3.5 lbs Total weight lost: 72.5 lbs  Goal weight: 200 lbs  TANITA  BODY COMP RESULTS  08/10/14 10/06/14 11/17/14 12/31/14 04/07/15 07/07/15   BMI (kg/m^2) 52.6 47.8 45.4 43.4 42.7 42.2   Fat Mass (lbs) 204 183.5 156.5 148.5 123.5 142   Fat Free Mass (lbs) 137 126.5 138 132.5 153.5 131.5   Total Body Water (lbs) 100.5 92.5 101 97 112.5 96.5    Preferred Learning Style:   No preference indicated   Learning Readiness:   Ready  24-hr recall: B (8:30-9AM): 1 piece [place] bacon and boiled egg (10g) Snk (AM):   L (1PM): deli meat and cheese and pickle (14g) Snk (2:45PM): rest of lunch  D (6PM): 1.5 oz chicken and vegetables (12g) Snk (PM):   Fluid intake: water, occasionally a few oz ginger ale (64 oz per day) Estimated total protein intake: ~60g per day per patient  Medications: see list Supplementation: taking   Using straws: no Drinking while eating: no Hair loss: yes, some thinning at the top, started taking hair, skin, and nails vitamin Carbonated beverages: sips of ginger ale N/V/D/C: sick feeling when she is full Dumping syndrome: none  Recent physical activity:  Not walking as much  Progress Towards Goal(s):  In progress.   Nutritional Diagnosis:  [place]-3.3 Overweight/obesity related to past poor  dietary habits and physical inactivity as evidenced by patient w/ recent gastric sleeve surgery following dietary guidelines for continued weight loss.     Intervention:  Nutrition counseling provided.  Teaching Method Utilized:  Visual Auditory Hands on  Barriers to learning/adherence to lifestyle change: gout flares  Demonstrated degree of understanding via:  Teach Back   Monitoring/Evaluation:  Dietary intake, exercise, and body weight. Follow up in 6 months for 15 month post-op visit.

## 2015-11-04 ENCOUNTER — Encounter: Payer: Self-pay | Admitting: Family

## 2015-11-04 ENCOUNTER — Ambulatory Visit (INDEPENDENT_AMBULATORY_CARE_PROVIDER_SITE_OTHER): Payer: 59 | Admitting: Family

## 2015-11-04 VITALS — BP 132/82 | HR 79 | Temp 98.0°F | Wt 283.0 lb

## 2015-11-04 DIAGNOSIS — J0100 Acute maxillary sinusitis, unspecified: Secondary | ICD-10-CM

## 2015-11-04 DIAGNOSIS — J309 Allergic rhinitis, unspecified: Secondary | ICD-10-CM | POA: Diagnosis not present

## 2015-11-04 MED ORDER — FLUTICASONE PROPIONATE 50 MCG/ACT NA SUSP: 2.0000 | Freq: Every day | NASAL | Status: DC

## 2015-11-04 MED ORDER — DOXYCYCLINE HYCLATE 100 MG PO TABS: 100.0000 mg | ORAL_TABLET | Freq: Two times a day (BID) | ORAL | Status: DC

## 2015-11-04 MED ORDER — CETIRIZINE HCL 10 MG PO CAPS: 1.0000 | ORAL_CAPSULE | Freq: Every morning | ORAL | Status: DC

## 2015-11-04 MED ORDER — KETOROLAC TROMETHAMINE 60 MG/2ML IM SOLN: 60.0000 mg | Freq: Once | INTRAMUSCULAR | Status: AC

## 2015-11-04 MED ORDER — KETOROLAC TROMETHAMINE 30 MG/ML IJ SOLN
30.0000 mg | Freq: Once | INTRAMUSCULAR | Status: AC
  Administered 2015-11-04: 30 mg via INTRAMUSCULAR

## 2015-11-04 NOTE — Progress Notes (Signed)
Subjective:    Patient ID: Janice Huang, female    DOB: 12-23-1970, 45 y.o.   MRN: [id number]   Janice Huang is a 45 y.o. female who presents today for an acute visit.    HPI Comments: Here for evaluation of left nostril soreness and left maxillary sinus tenderness for 4 days. Endorses nasal congestion, 'tension' headache, and left side sinus pressure. No cough, fever, chills, epitaxis. No pain with eating. Tried neosporin in nose for pain without relief. Took zyrtec with some relief yesterday. Had been in the hospital when grandchild was born over night 4 days ago. No injury.   H/o allergies  Past Medical History  Diagnosis Date  . Hypertension   . Hyperlipidemia   . Gout attack     last flare up last week, in toes  . History of hiatal hernia     small  . Headache    Allergies: Codeine and Penicillins Current Outpatient Prescriptions on File Prior to Visit  Medication Sig Dispense Refill  . acetaminophen (TYLENOL) 500 MG tablet Take 1,000 mg by mouth every 6 (six) hours as needed for moderate pain.    Marland Kitchen allopurinol (ZYLOPRIM) 300 MG tablet Take 1 tablet (300 mg total) by mouth daily. 90 tablet 1  . colchicine (COLCRYS) 0.6 MG tablet Take 1 tablet (0.6 mg total) by mouth daily. 270 tablet 1  . lisinopril (PRINIVIL,ZESTRIL) 20 MG tablet Take 1 tablet (20 mg total) by mouth daily. 90 tablet 1  . Multiple Vitamin (MULTIVITAMIN WITH MINERALS) TABS tablet Take 1 tablet by mouth every morning.     No current facility-administered medications on file prior to visit.    Social History  Substance Use Topics  . Smoking status: Never Smoker   . Smokeless tobacco: Never Used  . Alcohol Use: No    Review of Systems  Constitutional: Negative for fever and chills.  HENT: Positive for congestion, rhinorrhea and sinus pressure. Negative for ear pain and sore throat.   Respiratory: Negative for cough.   Cardiovascular: Negative for chest pain and palpitations.    Gastrointestinal: Negative for nausea and vomiting.      Objective:    BP 132/82 mmHg  Pulse 79  Temp(Src) 98 F (36.7 C)  Wt 283 lb (128.368 kg)  SpO2 98%   Physical Exam  Constitutional: She appears well-developed and well-nourished.  HENT:  Head: Normocephalic and atraumatic.  Right Ear: Hearing, tympanic membrane, external ear and ear canal normal. No drainage, swelling or tenderness. No foreign bodies. Tympanic membrane is not erythematous and not bulging. No middle ear effusion. No decreased hearing is noted.  Left Ear: Hearing, tympanic membrane, external ear and ear canal normal. No drainage, swelling or tenderness. No foreign bodies. Tympanic membrane is not erythematous and not bulging.  No middle ear effusion. No decreased hearing is noted.  Nose: Mucosal edema, rhinorrhea and sinus tenderness present. No nose lacerations or nasal septal hematoma. No epistaxis.  No foreign bodies. Right sinus exhibits no maxillary sinus tenderness and no frontal sinus tenderness. Left sinus exhibits maxillary sinus tenderness. Left sinus exhibits no frontal sinus tenderness.  Mouth/Throat: Uvula is midline, oropharynx is clear and moist and mucous membranes are normal. No oropharyngeal exudate, posterior oropharyngeal edema, posterior oropharyngeal erythema or tonsillar abscesses.  Turbinates erythematous and swollen bilateral nares, left worse than right. No lesions or epistaxis. Skin intact in nose and surrounding cheeks.    Eyes: Conjunctivae are normal.  Cardiovascular: Regular rhythm, normal heart sounds and normal  pulses.   Pulmonary/Chest: Effort normal and breath sounds normal. She has no wheezes. She has no rhonchi. She has no rales.  Lymphadenopathy:       Head (right side): No submental, no submandibular, no tonsillar, no preauricular, no posterior auricular and no occipital adenopathy present.       Head (left side): No submental, no submandibular, no tonsillar, no preauricular,  no posterior auricular and no occipital adenopathy present.    She has no cervical adenopathy.  Neurological: She is alert.  Skin: Skin is warm and dry.  Psychiatric: She has a normal mood and affect. Her speech is normal and behavior is normal. Thought content normal.  Vitals reviewed.      Assessment & Plan:   1. Acute maxillary sinusitis, recurrence not specified  - doxycycline (VIBRA-TABS) 100 MG tablet; Take 1 tablet (100 mg total) by mouth 2 (two) times daily.  Dispense: 14 tablet; Refill: 0 - fluticasone (FLONASE) 50 MCG/ACT nasal spray; Place 2 sprays into both nostrils daily.  Dispense: 16 g; Refill: 2 - ketorolac (TORADOL) injection 60 mg; Inject 2 mLs (60 mg total) into the muscle once. - ketorolac (TORADOL) 30 MG/ML injection 30 mg; Inject 1 mL (30 mg total) into the muscle once. - ketorolac (TORADOL) 30 MG/ML injection 30 mg; Inject 1 mL (30 mg total) into the muscle once.  2. Allergic rhinitis, unspecified allergic rhinitis type  - Cetirizine HCl (ZYRTEC ALLERGY) 10 MG CAPS; Take 1 capsule (10 mg total) by mouth every morning.  Dispense: 30 capsule; Refill: 2    I am having Janice Huang maintain her acetaminophen, multivitamin with minerals, lisinopril, colchicine, and allopurinol.   No orders of the defined types were placed in this encounter.     Start medications as prescribed and explained to patient on After Visit Summary ( AVS). Risks, benefits, and alternatives of the medications and treatment plan prescribed today were discussed, and patient expressed understanding.   Education regarding symptom management and diagnosis given to patient.   Follow-up:Plan follow-up and return precautions given if any worsening symptoms or change in condition.   Continue to follow with Sanda Lingerhomas Jones, MD for routine health maintenance.   Janice Huang and I agreed with plan.   Rennie PlowmanMargaret Arnett, FNP

## 2015-11-04 NOTE — Progress Notes (Signed)
Pre visit review using our clinic review tool, if applicable. No additional management support is needed unless otherwise documented below in the visit note. 

## 2015-11-04 NOTE — Patient Instructions (Signed)

## 2015-12-08 ENCOUNTER — Encounter: Payer: Self-pay | Admitting: Internal Medicine

## 2015-12-08 ENCOUNTER — Ambulatory Visit (INDEPENDENT_AMBULATORY_CARE_PROVIDER_SITE_OTHER): Payer: 59 | Admitting: Internal Medicine

## 2015-12-08 VITALS — BP 110/80 | HR 67 | Temp 99.2°F | Resp 16 | Ht 67.5 in | Wt 287.0 lb

## 2015-12-08 DIAGNOSIS — E785 Hyperlipidemia, unspecified: Secondary | ICD-10-CM | POA: Diagnosis not present

## 2015-12-08 DIAGNOSIS — M1000 Idiopathic gout, unspecified site: Secondary | ICD-10-CM | POA: Diagnosis not present

## 2015-12-08 DIAGNOSIS — I10 Essential (primary) hypertension: Secondary | ICD-10-CM | POA: Diagnosis not present

## 2015-12-08 MED ORDER — LISINOPRIL 20 MG PO TABS: 20.0000 mg | ORAL_TABLET | Freq: Every day | ORAL | Status: DC

## 2015-12-08 MED ORDER — COLCHICINE 0.6 MG PO TABS: 0.6000 mg | ORAL_TABLET | Freq: Two times a day (BID) | ORAL | Status: DC

## 2015-12-08 MED ORDER — ALLOPURINOL 300 MG PO TABS: 300.0000 mg | ORAL_TABLET | Freq: Every day | ORAL | Status: DC

## 2015-12-08 NOTE — Progress Notes (Signed)
Pre visit review using our clinic review tool, if applicable. No additional management support is needed unless otherwise documented below in the visit note. 

## 2015-12-08 NOTE — Progress Notes (Signed)
Subjective:  Patient ID: Janice Huang, female    DOB: [birth year]  Age: 45 y.o. MRN: [id number]  CC: Hypertension   HPI Janice Huang presents for a blood pressure check and refill of medications for gout. She tells me her blood pressure has been well controlled and she has had no recent episodes of headache/blurred vision/chest pain/shortness of breath/palpitations/edema,/fatigue.  She also tells me that she has had no flareups of her gout and wants to stay on allopurinol and colchicine. She's had no recent episodes of joint pain or swelling.  Outpatient Prescriptions Prior to Visit  Medication Sig Dispense Refill  . acetaminophen (TYLENOL) 500 MG tablet Take 1,000 mg by mouth every 6 (six) hours as needed for moderate pain.    . Cetirizine HCl (ZYRTEC ALLERGY) 10 MG CAPS Take 1 capsule (10 mg total) by mouth every morning. 30 capsule 2  . fluticasone (FLONASE) 50 MCG/ACT nasal spray Place 2 sprays into both nostrils daily. 16 g 2  . Multiple Vitamin (MULTIVITAMIN WITH MINERALS) TABS tablet Take 1 tablet by mouth every morning.    Marland Kitchen allopurinol (ZYLOPRIM) 300 MG tablet Take 1 tablet (300 mg total) by mouth daily. 90 tablet 1  . colchicine (COLCRYS) 0.6 MG tablet Take 1 tablet (0.6 mg total) by mouth daily. 270 tablet 1  . doxycycline (VIBRA-TABS) 100 MG tablet Take 1 tablet (100 mg total) by mouth 2 (two) times daily. 14 tablet 0  . lisinopril (PRINIVIL,ZESTRIL) 20 MG tablet Take 1 tablet (20 mg total) by mouth daily. 90 tablet 1   No facility-administered medications prior to visit.    ROS Review of Systems  Constitutional: Negative.  Negative for appetite change, fatigue and unexpected weight change.  HENT: Negative.   Eyes: Negative.  Negative for visual disturbance.  Respiratory: Negative.  Negative for cough, choking, chest tightness, shortness of breath and stridor.   Cardiovascular: Negative.  Negative for chest pain, palpitations and leg swelling.    Gastrointestinal: Negative.  Negative for nausea, vomiting, abdominal pain, diarrhea and constipation.  Endocrine: Negative.   Genitourinary: Negative.  Negative for dysuria, urgency, frequency, hematuria and difficulty urinating.  Musculoskeletal: Negative.  Negative for myalgias, joint swelling and arthralgias.  Skin: Negative.  Negative for color change and rash.  Allergic/Immunologic: Negative.   Neurological: Negative.   Hematological: Negative.  Negative for adenopathy. Does not bruise/bleed easily.  Psychiatric/Behavioral: Negative.     Objective:  BP 110/80 mmHg  Pulse 67  Temp(Src) 99.2 F (37.3 C) (Oral)  Resp 16  Ht 5' 7.5" (1.715 m)  Wt 287 lb (130.182 kg)  BMI 44.26 kg/m2  SpO2 98%  BP Readings from Last 3 Encounters:  12/08/15 110/80  11/04/15 132/82  06/09/15 120/80    Wt Readings from Last 3 Encounters:  12/08/15 287 lb (130.182 kg)  11/04/15 283 lb (128.368 kg)  07/07/15 273 lb 8 oz (124.059 kg)    Physical Exam  Constitutional: She is oriented to person, place, and time. No distress.  HENT:  Mouth/Throat: Oropharynx is clear and moist. No oropharyngeal exudate.  Eyes: Conjunctivae are normal. Right eye exhibits no discharge. Left eye exhibits no discharge. No scleral icterus.  Neck: Normal range of motion. Neck supple. No JVD present. No tracheal deviation present. No thyromegaly present.  Cardiovascular: Normal rate, regular rhythm, normal heart sounds and intact distal pulses.  Exam reveals no gallop and no friction rub.   No murmur heard. Pulmonary/Chest: Effort normal and breath sounds normal. No stridor. No respiratory distress.  She has no wheezes. She has no rales. She exhibits no tenderness.  Abdominal: Soft. Bowel sounds are normal. She exhibits no distension and no mass. There is no tenderness. There is no rebound and no guarding.  Musculoskeletal: Normal range of motion. She exhibits no edema or tenderness.  Lymphadenopathy:    She has no  cervical adenopathy.  Neurological: She is oriented to person, place, and time.  Skin: Skin is warm and dry. No rash noted. She is not diaphoretic. No erythema. No pallor.  Vitals reviewed.   Lab Results  Component Value Date   WBC 6.5 01/15/2015   HGB 12.4 01/15/2015   HCT 36 01/15/2015   PLT 225 01/15/2015   GLUCOSE 82 04/14/2015   CHOL 254* 04/14/2015   TRIG 85.0 04/14/2015   HDL 51.60 04/14/2015   LDLDIRECT 217.2 05/23/2013   LDLCALC 186* 04/14/2015   ALT 27 01/15/2015   AST 24 01/15/2015   NA 138 04/14/2015   K 3.9 04/14/2015   CL 104 04/14/2015   CREATININE 0.68 04/14/2015   BUN 8 04/14/2015   CO2 24 04/14/2015   TSH 0.90 04/14/2015   HGBA1C 5.7 04/14/2015   MICROALBUR 2.8* 04/14/2015    Mm Digital Screening Bilateral  09/21/2014  CLINICAL DATA:  Screening. EXAM: DIGITAL SCREENING BILATERAL MAMMOGRAM WITH CAD COMPARISON:  Previous exam(s). ACR Breast Density Category b: There are scattered areas of fibroglandular density. FINDINGS: There are no findings suspicious for malignancy. Images were processed with CAD. IMPRESSION: No mammographic evidence of malignancy. A result letter of this screening mammogram will be mailed directly to the patient. RECOMMENDATION: Screening mammogram in one year. (Code:SM-B-01Y) BI-RADS CATEGORY  1: Negative. Electronically Signed   By: Britta MccreedySusan  Turner M.D.   On: 09/21/2014 16:29    Assessment & Plan:   [name] was seen today for hypertension.  Diagnoses and all orders for this visit:  Idiopathic gout, unspecified chronicity, unspecified site- she has had no gout flares on this combination so will continue -     allopurinol (ZYLOPRIM) 300 MG tablet; Take 1 tablet (300 mg total) by mouth daily. -     colchicine (COLCRYS) 0.6 MG tablet; Take 1 tablet (0.6 mg total) by mouth 2 (two) times daily.  Essential hypertension- her blood pressure is adequately well-controlled -     lisinopril (PRINIVIL,ZESTRIL) 20 MG tablet; Take 1 tablet (20 mg  total) by mouth daily.  Hyperlipidemia with target LDL less than 130- her Framingham risk score is only 3% so I do not recommend that she take a statin  Essential hypertension, benign   I have discontinued Ms. Gotschall's doxycycline. I have also changed her colchicine. Additionally, I am having her maintain her acetaminophen, multivitamin with minerals, fluticasone, Cetirizine HCl, allopurinol, and lisinopril.  Meds ordered this encounter  Medications  . allopurinol (ZYLOPRIM) 300 MG tablet    Sig: Take 1 tablet (300 mg total) by mouth daily.    Dispense:  90 tablet    Refill:  1  . lisinopril (PRINIVIL,ZESTRIL) 20 MG tablet    Sig: Take 1 tablet (20 mg total) by mouth daily.    Dispense:  90 tablet    Refill:  1  . colchicine (COLCRYS) 0.6 MG tablet    Sig: Take 1 tablet (0.6 mg total) by mouth 2 (two) times daily.    Dispense:  270 tablet    Refill:  1     Follow-up: Return in about 6 months (around 06/09/2016).  Sanda Lingerhomas Jawara Latorre, MD

## 2015-12-08 NOTE — Patient Instructions (Signed)
Hypertension Hypertension, commonly called high blood pressure, is when the force of blood pumping through your arteries is too strong. Your arteries are the blood vessels that carry blood from your heart throughout your body. A blood pressure reading consists of a higher number over a lower number, such as 110/72. The higher number (systolic) is the pressure inside your arteries when your heart pumps. The lower number (diastolic) is the pressure inside your arteries when your heart relaxes. Ideally you want your blood pressure below 120/80. Hypertension forces your heart to work harder to pump blood. Your arteries may become narrow or stiff. Having untreated or uncontrolled hypertension can cause heart attack, stroke, kidney disease, and other problems. RISK FACTORS Some risk factors for high blood pressure are controllable. Others are not.  Risk factors you cannot control include:   Race. You may be at higher risk if you are African American.  Age. Risk increases with age.  Gender. Men are at higher risk than women before age 45 years. After age 65, women are at higher risk than men. Risk factors you can control include:  Not getting enough exercise or physical activity.  Being overweight.  Getting too much fat, sugar, calories, or salt in your diet.  Drinking too much alcohol. SIGNS AND SYMPTOMS Hypertension does not usually cause signs or symptoms. Extremely high blood pressure (hypertensive crisis) may cause headache, anxiety, shortness of breath, and nosebleed. DIAGNOSIS To check if you have hypertension, your health care provider will measure your blood pressure while you are seated, with your arm held at the level of your heart. It should be measured at least twice using the same arm. Certain conditions can cause a difference in blood pressure between your right and left arms. A blood pressure reading that is higher than normal on one occasion does not mean that you need treatment. If  it is not clear whether you have high blood pressure, you may be asked to return on a different day to have your blood pressure checked again. Or, you may be asked to monitor your blood pressure at home for 1 or more weeks. TREATMENT Treating high blood pressure includes making lifestyle changes and possibly taking medicine. Living a healthy lifestyle can help lower high blood pressure. You may need to change some of your habits. Lifestyle changes may include:  Following the DASH diet. This diet is high in fruits, vegetables, and whole grains. It is low in salt, red meat, and added sugars.  Keep your sodium intake below 2,300 mg per day.  Getting at least 30-45 minutes of aerobic exercise at least 4 times per week.  Losing weight if necessary.  Not smoking.  Limiting alcoholic beverages.  Learning ways to reduce stress. Your health care provider may prescribe medicine if lifestyle changes are not enough to get your blood pressure under control, and if one of the following is true:  You are 18-59 years of age and your systolic blood pressure is above 140.  You are 60 years of age or older, and your systolic blood pressure is above 150.  Your diastolic blood pressure is above 90.  You have diabetes, and your systolic blood pressure is over 140 or your diastolic blood pressure is over 90.  You have kidney disease and your blood pressure is above 140/90.  You have heart disease and your blood pressure is above 140/90. Your personal target blood pressure may vary depending on your medical conditions, your age, and other factors. HOME CARE INSTRUCTIONS    Have your blood pressure rechecked as directed by your health care provider.   Take medicines only as directed by your health care provider. Follow the directions carefully. Blood pressure medicines must be taken as prescribed. The medicine does not work as well when you skip doses. Skipping doses also puts you at risk for  problems.  Do not smoke.   Monitor your blood pressure at home as directed by your health care provider. SEEK MEDICAL CARE IF:   You think you are having a reaction to medicines taken.  You have recurrent headaches or feel dizzy.  You have swelling in your ankles.  You have trouble with your vision. SEEK IMMEDIATE MEDICAL CARE IF:  You develop a severe headache or confusion.  You have unusual weakness, numbness, or feel faint.  You have severe chest or abdominal pain.  You vomit repeatedly.  You have trouble breathing. MAKE SURE YOU:   Understand these instructions.  Will watch your condition.  Will get help right away if you are not doing well or get worse.   This information is not intended to replace advice given to you by your health care provider. Make sure you discuss any questions you have with your health care provider.   Document Released: 05/08/2005 Document Revised: 09/22/2014 Document Reviewed: 02/28/2013 Elsevier Interactive Patient Education 2016 [employer]vier Inc.  

## 2016-01-05 ENCOUNTER — Encounter: Payer: 59 | Attending: Internal Medicine | Admitting: Dietician

## 2016-01-05 ENCOUNTER — Encounter: Payer: Self-pay | Admitting: Dietician

## 2016-01-05 DIAGNOSIS — Z029 Encounter for administrative examinations, unspecified: Secondary | ICD-10-CM | POA: Insufficient documentation

## 2016-01-05 NOTE — Progress Notes (Signed)
  Follow-up visit:  15 months Post-Operative Gastric sleeve Surgery  Medical Nutrition Therapy:  Appt start time: 440 end time: 510  Primary concerns today: Post-operative Bariatric Surgery Nutrition Management.  Janice Huang returns today for nutrition followup. She declined an updated weight today. States that her weight has stalled. She does not weigh at home but weighed at a wellness screening for work. Exercising more but feels like this is making her need to eat more. Has been eating more fruit throughout the day. She is excited about her exercise routine and wants to stay motivated.  Surgery date: 09/21/14 Surgery type: Gastric sleeve [facility] lbs on 07/11/14 Weight today: 273.5 lbs Weight change: 3.5 lbs Total weight lost: 72.5 lbs  Goal weight: 200 lbs   TANITA  BODY COMP RESULTS  08/10/14 10/06/14 11/17/14 12/31/14 04/07/15 07/07/15 01/05/16   BMI (kg/m^2) 52.6 47.8 45.4 43.4 42.7 42.2    Fat Mass (lbs) 204 183.5 156.5 148.5 123.5 142    Fat Free Mass (lbs) 137 126.5 138 132.5 153.5 131.5    Total Body Water (lbs) 100.5 92.5 101 97 112.5 96.5     Preferred Learning Style:   No preference indicated   Learning Readiness:   Ready  24-hr recall: B (8:30-9AM): fruit and 1/4 [employer] bagel  Snk (AM):  Sometimes, Skinny Girl popcorn or fruit L (1PM): 2 grilled chicken wings, pintos, and collards OR grilled chicken salad Snk (PM): rest of lunch D (6PM): meat and vegetables Snk (PM):   Fluid intake: water, coffee with low sugar creamer, occasionally a few oz ginger ale Estimated total protein intake: ~60g per day per patient  Medications: see list Supplementation: taking   Using straws: no Drinking while eating: no Hair loss: yes, some thinning at the top, started taking hair, skin, and nails vitamin Carbonated beverages: sips of ginger ale N/V/D/C: sick feeling when she is full Dumping syndrome: none  Recent physical activity:  Gym 3x a week and walking    Progress Towards Goal(s):  In progress.   Nutritional Diagnosis:  [place]-3.3 Overweight/obesity related to past poor dietary habits and physical inactivity as evidenced by patient w/ recent gastric sleeve surgery following dietary guidelines for continued weight loss.     Intervention:  Nutrition counseling provided.  Teaching Method Utilized:  Visual Auditory Hands on  Barriers to learning/adherence to lifestyle change: gout flares  Demonstrated degree of understanding via:  Teach Back   Monitoring/Evaluation:  Dietary intake, exercise, and body weight. Follow up prn.

## 2016-01-05 NOTE — Patient Instructions (Addendum)
    Keep focusing on non scale victories: better energy and stamina, clothing size, easier to put shoes on, feeling comfortable putting on shoes, traveling, better fitting clothes, more confidence, overall active lifestyle  Habits to re-establish: -Think about a higher protein option for breakfast  -Egg, [place] sausage  Habits to keep: -Exercise routine -Smaller portions -Being okay not cleaning your plate -Small bag of popcorn -Not drinking with meals -More active lifestyle (taking a dance class and getting out of bed earlier) -No fried foods or starches -Healthier snacks and drinks -Walking on breaks at work and parking far away      Foods high in potassium:  Vegetables  o Zucchini,  cup cooked has 280 mg potassium Legumes  o Soybeans,  cup cooked has 440 mg potassium o Lentils,  cup cooked has 370 mg potassium o Kidney beans,  cup cooked has 360 mg potassium o White beans, canned, drained, half cup: 595 mg Protein o Yogurt, fat-free, 1 cup: 579 mg o Halibut, 3 ounces, cooked: 490 mg o Pork tenderloin, 3 ounces, cooked: 382 mg o Milk, skim or soy, 8 ounces: 340-410 mg o Salmon, farmed Atlantic, 3 ounces, cooked: 326 mg o Chicken breast, 3 ounces, cooked: 218 mg o Tuna, light, canned, drained, 3 ounces: 201 mg

## 2016-02-07 ENCOUNTER — Encounter: Payer: Self-pay | Admitting: Nurse Practitioner

## 2016-02-07 ENCOUNTER — Ambulatory Visit (INDEPENDENT_AMBULATORY_CARE_PROVIDER_SITE_OTHER): Payer: 59 | Admitting: Nurse Practitioner

## 2016-02-07 VITALS — BP 142/82 | HR 77 | Temp 98.7°F | Ht 67.0 in | Wt 282.1 lb

## 2016-02-07 DIAGNOSIS — J309 Allergic rhinitis, unspecified: Secondary | ICD-10-CM | POA: Diagnosis not present

## 2016-02-07 DIAGNOSIS — J0101 Acute recurrent maxillary sinusitis: Secondary | ICD-10-CM

## 2016-02-07 DIAGNOSIS — M10072 Idiopathic gout, left ankle and foot: Secondary | ICD-10-CM

## 2016-02-07 MED ORDER — METHYLPREDNISOLONE ACETATE 80 MG/ML IJ SUSP: 80.0000 mg | Freq: Once | INTRAMUSCULAR | Status: DC

## 2016-02-07 MED ORDER — METHYLPREDNISOLONE ACETATE 80 MG/ML IJ SUSP
80.0000 mg | Freq: Once | INTRAMUSCULAR | Status: AC
  Administered 2016-02-07: 80 mg via INTRAMUSCULAR

## 2016-02-07 MED ORDER — PREDNISONE 10 MG (21) PO TBPK: 10.0000 mg | ORAL_TABLET | Freq: Every day | ORAL | 0 refills | Status: DC

## 2016-02-07 MED ORDER — IPRATROPIUM BROMIDE 0.03 % NA SOLN: 2.0000 | Freq: Two times a day (BID) | NASAL | 12 refills | Status: DC

## 2016-02-07 NOTE — Patient Instructions (Signed)
Start oral prednisone tomorrow. Take with food Call office for oral abx if no improvement 3days after starting oral prednisone. Encourage adequate oral hydration. May also use robitussin DM for cough.  Elevate left foot as much as possible. May apply cold compress as needed. Call office if no improvement in 1week

## 2016-02-07 NOTE — Progress Notes (Signed)
Subjective:  Patient ID: Janice Huang, female    DOB: Oct 30, 1970  Age: 45 y.o. MRN: [id number]  CC: Gout (Pt stated have history of gout attack on the left foot/swollen. Marland Kitchen) and URI (Also, pt is coughing ,congested, runny nose for for 1 week.)   URI   This is a new problem. The current episode started in the past 7 days. The problem has been gradually worsening. There has been no fever. Associated symptoms include congestion, coughing, rhinorrhea, sinus pain, a sore throat and swollen glands. Pertinent negatives include no chest pain, ear pain, headaches, joint pain, plugged ear sensation, sneezing or wheezing. Treatments tried: Flonase and Zyrtec. The treatment provided no relief.  Being treated for similar symptoms in 10/2015 with doxycycline and Flonase. States her symptoms improved.  Gout: Also presents with recurrent left foot for gout attack. Denies any injury. No numbness or tingling. No loss of sensation. Denies any change in diet or medications. Has pain with joint movement. Currently using colchicine twice a day and Allopurinol.  Outpatient Medications Prior to Visit  Medication Sig Dispense Refill  . acetaminophen (TYLENOL) 500 MG tablet Take 1,000 mg by mouth every 6 (six) hours as needed for moderate pain.    Marland Kitchen allopurinol (ZYLOPRIM) 300 MG tablet Take 1 tablet (300 mg total) by mouth daily. 90 tablet 1  . Cetirizine HCl (ZYRTEC ALLERGY) 10 MG CAPS Take 1 capsule (10 mg total) by mouth every morning. 30 capsule 2  . colchicine (COLCRYS) 0.6 MG tablet Take 1 tablet (0.6 mg total) by mouth 2 (two) times daily. 270 tablet 1  . fluticasone (FLONASE) 50 MCG/ACT nasal spray Place 2 sprays into both nostrils daily. 16 g 2  . lisinopril (PRINIVIL,ZESTRIL) 20 MG tablet Take 1 tablet (20 mg total) by mouth daily. 90 tablet 1  . Multiple Vitamin (MULTIVITAMIN WITH MINERALS) TABS tablet Take 1 tablet by mouth every morning.     No facility-administered medications prior to visit.       ROS See HPI  Objective:  BP (!) 142/82 (BP Location: Left Arm, Patient Position: Sitting, Cuff Size: Large)   Pulse 77   Temp 98.7 F (37.1 C)   Ht 5\' 7"  (1.702 m)   Wt 282 lb 1.9 oz (128 kg)   SpO2 97%   BMI 44.19 kg/m   BP Readings from Last 3 Encounters:  02/07/16 (!) 142/82  12/08/15 110/80  11/04/15 132/82    Wt Readings from Last 3 Encounters:  02/07/16 282 lb 1.9 oz (128 kg)  12/08/15 287 lb (130.2 kg)  11/04/15 283 lb (128.4 kg)    Physical Exam  Constitutional: She is oriented to person, place, and time. No distress.  HENT:  Right Ear: External ear normal.  Left Ear: External ear normal.  Nose: Mucosal edema and rhinorrhea present. Right sinus exhibits maxillary sinus tenderness.  Mouth/Throat: Uvula is midline. Posterior oropharyngeal erythema present. No oropharyngeal exudate.  Eyes: No scleral icterus.  Neck: Normal range of motion. Neck supple.  Cardiovascular: Normal rate, regular rhythm and normal heart sounds.   Pulmonary/Chest: Effort normal and breath sounds normal. No respiratory distress.  Musculoskeletal: Normal range of motion. She exhibits edema.       Left foot: There is tenderness and swelling. There is normal range of motion.       Feet:  Swelling and tenderness over the MTP joint. No distal phalangeal pain. No joint effusion. Increased warmth but no erythema. Limping gait.  Lymphadenopathy:    She has  no cervical adenopathy.  Neurological: She is alert and oriented to person, place, and time.  Skin: Skin is warm and dry. No rash noted. No erythema.  Psychiatric: She has a normal mood and affect. Her behavior is normal.    Lab Results  Component Value Date   WBC 6.5 01/15/2015   HGB 12.4 01/15/2015   HCT 36 01/15/2015   PLT 225 01/15/2015   GLUCOSE 82 04/14/2015   CHOL 254 (H) 04/14/2015   TRIG 85.0 04/14/2015   HDL 51.60 04/14/2015   LDLDIRECT 217.2 05/23/2013   LDLCALC 186 (H) 04/14/2015   ALT 27 01/15/2015   AST 24  01/15/2015   NA 138 04/14/2015   K 3.9 04/14/2015   CL 104 04/14/2015   CREATININE 0.68 04/14/2015   BUN 8 04/14/2015   CO2 24 04/14/2015   TSH 0.90 04/14/2015   HGBA1C 5.7 04/14/2015   MICROALBUR 2.8 (H) 04/14/2015    Mm Digital Screening Bilateral  Result Date: 09/21/2014 CLINICAL DATA:  Screening. EXAM: DIGITAL SCREENING BILATERAL MAMMOGRAM WITH CAD COMPARISON:  Previous exam(s). ACR Breast Density Category b: There are scattered areas of fibroglandular density. FINDINGS: There are no findings suspicious for malignancy. Images were processed with CAD. IMPRESSION: No mammographic evidence of malignancy. A result letter of this screening mammogram will be mailed directly to the patient. RECOMMENDATION: Screening mammogram in one year. (Code:SM-B-01Y) BI-RADS CATEGORY  1: Negative. Electronically Signed   By: Britta Mccreedy M.D.   On: 09/21/2014 16:29    Assessment & Plan:   Janice Huang was seen today for gout and uri.  Diagnoses and all orders for this visit:  Acute recurrent maxillary sinusitis -     methylPREDNISolone acetate (DEPO-MEDROL) injection 80 mg; Inject 1 mL (80 mg total) into the muscle once. -     predniSONE (STERAPRED UNI-PAK 21 TAB) 10 MG (21) TBPK tablet; Take 1 tablet (10 mg total) by mouth daily.  Allergic rhinitis, unspecified allergic rhinitis type -     methylPREDNISolone acetate (DEPO-MEDROL) injection 80 mg; Inject 1 mL (80 mg total) into the muscle once. -     predniSONE (STERAPRED UNI-PAK 21 TAB) 10 MG (21) TBPK tablet; Take 1 tablet (10 mg total) by mouth daily. -     ipratropium (ATROVENT) 0.03 % nasal spray; Place 2 sprays into both nostrils 2 (two) times daily. Do not use for more than 5days.  Acute idiopathic gout of left foot -     methylPREDNISolone acetate (DEPO-MEDROL) injection 80 mg; Inject 1 mL (80 mg total) into the muscle once. -     predniSONE (STERAPRED UNI-PAK 21 TAB) 10 MG (21) TBPK tablet; Take 1 tablet (10 mg total) by mouth daily.   I  am having [name] start on predniSONE and ipratropium. I am also having her maintain her acetaminophen, multivitamin with minerals, fluticasone, Cetirizine HCl, allopurinol, lisinopril, and colchicine. We will continue to administer methylPREDNISolone acetate.  Meds ordered this encounter  Medications  . methylPREDNISolone acetate (DEPO-MEDROL) injection 80 mg  . predniSONE (STERAPRED UNI-PAK 21 TAB) 10 MG (21) TBPK tablet    Sig: Take 1 tablet (10 mg total) by mouth daily.    Dispense:  21 tablet    Refill:  0    Order Specific Question:   Supervising Provider    Answer:   Tresa Garter [[id number]]  . ipratropium (ATROVENT) 0.03 % nasal spray    Sig: Place 2 sprays into both nostrils 2 (two) times daily. Do not use for more than  5days.    Dispense:  30 mL    Refill:  12    Order Specific Question:   Supervising Provider    Answer:   Tresa GarterPLOTNIKOV, ALEKSEI V [[id number]]    Follow-up: Return if symptoms worsen or fail to improve.  Janice Pennaharlotte Nicolaas Savo, NP

## 2016-02-07 NOTE — Progress Notes (Signed)
Pre visit review using our clinic review tool, if applicable. No additional management support is needed unless otherwise documented below in the visit note. 

## 2016-02-08 ENCOUNTER — Telehealth: Payer: Self-pay

## 2016-02-08 ENCOUNTER — Telehealth: Payer: Self-pay | Admitting: Nurse Practitioner

## 2016-02-08 NOTE — Telephone Encounter (Signed)
[facility] to [facility] on [address] but pharmacy is stating dosage is missing and needs a call in regard.

## 2016-02-08 NOTE — Telephone Encounter (Signed)
Pharmacy called to verify directions on prednisone unipak sent in yesterday----I have advised Janice Huang and she has given telephone order to prescribe as dosage states on package

## 2016-02-16 ENCOUNTER — Telehealth: Payer: Self-pay

## 2016-02-16 NOTE — Telephone Encounter (Signed)
Informed pt wellness form is upfront for pick up.  Copy has been sent to scan.

## 2016-02-23 ENCOUNTER — Telehealth: Payer: Self-pay

## 2016-02-23 NOTE — Telephone Encounter (Signed)
Patient dropped off paper work requesting FMLA: Intermitten leave from 02/07/2016 to 08/07/2015. She stated that she needs 3 days per month per episode of gout.

## 2016-02-24 NOTE — Telephone Encounter (Signed)
Papers have been faced to number listed on forms.

## 2016-03-17 ENCOUNTER — Telehealth: Payer: Self-pay

## 2016-03-17 MED ORDER — CETIRIZINE HCL 10 MG PO CAPS: 1.0000 | ORAL_CAPSULE | Freq: Every morning | ORAL | 1 refills | Status: DC

## 2016-03-17 NOTE — Telephone Encounter (Signed)
[facility] request for cetirizine HCL 10 mg tablet. erx sent.

## 2016-04-25 ENCOUNTER — Encounter (HOSPITAL_COMMUNITY): Payer: Self-pay

## 2016-05-02 ENCOUNTER — Ambulatory Visit (INDEPENDENT_AMBULATORY_CARE_PROVIDER_SITE_OTHER): Payer: 59 | Admitting: Internal Medicine

## 2016-05-02 ENCOUNTER — Encounter: Payer: Self-pay | Admitting: Internal Medicine

## 2016-05-02 DIAGNOSIS — M25571 Pain in right ankle and joints of right foot: Secondary | ICD-10-CM | POA: Diagnosis not present

## 2016-05-02 DIAGNOSIS — M25572 Pain in left ankle and joints of left foot: Secondary | ICD-10-CM

## 2016-05-02 NOTE — Progress Notes (Signed)
Pre visit review using our clinic review tool, if applicable. No additional management support is needed unless otherwise documented below in the visit note. 

## 2016-05-07 DIAGNOSIS — M25572 Pain in left ankle and joints of left foot: Secondary | ICD-10-CM

## 2016-05-07 DIAGNOSIS — G8929 Other chronic pain: Secondary | ICD-10-CM | POA: Insufficient documentation

## 2016-05-07 DIAGNOSIS — M25571 Pain in right ankle and joints of right foot: Secondary | ICD-10-CM | POA: Insufficient documentation

## 2016-05-07 NOTE — Progress Notes (Signed)
Subjective:    Patient ID: Janice Huang, female    DOB: 08-07-1970, 45 y.o.   MRN: [id number]  HPI   Here with c/o 3 days onset pain/swelling/tender bilat ankles right > left, without fever, trauma. Has had recurrent episodes in past.  Pt denies chest pain, increased sob or doe, wheezing, orthopnea, PND, increased LE swelling, palpitations, dizziness or syncope. Past Medical History:  Diagnosis Date  . Gout attack    last flare up last week, in toes  . Headache   . History of hiatal hernia    small  . Hyperlipidemia   . Hypertension    Past Surgical History:  Procedure Laterality Date  .  c section x 2    . ABDOMINAL HYSTERECTOMY     1 ovary removed  . LAPAROSCOPIC GASTRIC SLEEVE RESECTION N/A 09/21/2014   Procedure: LAPAROSCOPIC GASTRIC SLEEVE RESECTION WITH HIATAL HERNIA REPAIR;  Surgeon: Luretha MurphyMatthew Martin, MD;  Location: [facility];  Service: General;  Laterality: N/A;  . TUBAL LIGATION      reports that she has never smoked. She has never used smokeless tobacco. She reports that she does not drink alcohol or use drugs. family history includes Diabetes in her other; Hypertension in her other. Allergies  Allergen Reactions  . Codeine     REACTION: itching and hives  . Penicillins Itching   Current Outpatient Prescriptions on File Prior to Visit  Medication Sig Dispense Refill  . acetaminophen (TYLENOL) 500 MG tablet Take 1,000 mg by mouth every 6 (six) hours as needed for moderate pain.    Marland Kitchen. allopurinol (ZYLOPRIM) 300 MG tablet Take 1 tablet (300 mg total) by mouth daily. 90 tablet 1  . Cetirizine HCl (ZYRTEC ALLERGY) 10 MG CAPS Take 1 capsule (10 mg total) by mouth every morning. 90 capsule 1  . colchicine (COLCRYS) 0.6 MG tablet Take 1 tablet (0.6 mg total) by mouth 2 (two) times daily. 270 tablet 1  . fluticasone (FLONASE) 50 MCG/ACT nasal spray Place 2 sprays into both nostrils daily. 16 g 2  . ipratropium (ATROVENT) 0.03 % nasal spray Place 2 sprays into both nostrils 2  (two) times daily. Do not use for more than 5days. 30 mL 12  . lisinopril (PRINIVIL,ZESTRIL) 20 MG tablet Take 1 tablet (20 mg total) by mouth daily. 90 tablet 1  . Multiple Vitamin (MULTIVITAMIN WITH MINERALS) TABS tablet Take 1 tablet by mouth every morning.    . predniSONE (STERAPRED UNI-PAK 21 TAB) 10 MG (21) TBPK tablet Take 1 tablet (10 mg total) by mouth daily. 21 tablet 0   No current facility-administered medications on file prior to visit.    Review of Systems All otherwise neg per pt     Objective:   Physical Exam BP 140/80   Pulse 72   Resp 20   Wt 286 lb (129.7 kg)   SpO2 98%   BMI 44.79 kg/m  VS noted, not ill appaering Constitutional: Pt appears in no apparent distress HENT: Head: NCAT.  Right Ear: External ear normal.  Left Ear: External ear normal.  Eyes: . Pupils are equal, round, and reactive to light. Conjunctivae and EOM are normal Neck: Normal range of motion. Neck supple.  Cardiovascular: Normal rate and regular rhythm.   Pulmonary/Chest: Effort normal and breath sounds without rales or wheezing.  Neurological: Pt is alert. Not confused , motor grossly intact Skin: Skin is warm. No rash, no LE edema Psychiatric: Pt behavior is normal. No agitation.  Bilat ankles with  tender mild effusions right > left       Assessment & Plan:

## 2016-05-07 NOTE — Patient Instructions (Signed)
Please take all new medication as prescribed - the prednisone  Please continue all other medications as before, and refills have been done if requested.  Please have the pharmacy call with any other refills you may need.  Please keep your appointments with your specialists as you may have planned   

## 2016-05-07 NOTE — Assessment & Plan Note (Signed)
To continue colchicine, allopurinol, mild to mod, for predpac asd,  to f/u any worsening symptoms or concerns

## 2016-05-10 LAB — HM DIABETES EYE EXAM

## 2016-06-11 ENCOUNTER — Other Ambulatory Visit: Payer: Self-pay | Admitting: Internal Medicine

## 2016-06-11 DIAGNOSIS — I10 Essential (primary) hypertension: Secondary | ICD-10-CM

## 2016-06-14 ENCOUNTER — Ambulatory Visit: Payer: 59 | Admitting: Internal Medicine

## 2016-07-04 ENCOUNTER — Encounter: Payer: Self-pay | Admitting: Nurse Practitioner

## 2016-07-04 ENCOUNTER — Ambulatory Visit (INDEPENDENT_AMBULATORY_CARE_PROVIDER_SITE_OTHER): Payer: 59 | Admitting: Nurse Practitioner

## 2016-07-04 VITALS — BP 124/80 | HR 63 | Temp 98.4°F | Ht 67.0 in | Wt 283.0 lb

## 2016-07-04 DIAGNOSIS — J0100 Acute maxillary sinusitis, unspecified: Secondary | ICD-10-CM

## 2016-07-04 MED ORDER — GUAIFENESIN ER 600 MG PO TB12: 600.0000 mg | ORAL_TABLET | Freq: Two times a day (BID) | ORAL | 0 refills | Status: DC | PRN

## 2016-07-04 MED ORDER — SALINE SPRAY 0.65 % NA SOLN: 1.0000 | NASAL | 0 refills | Status: DC | PRN

## 2016-07-04 MED ORDER — AZITHROMYCIN 250 MG PO TABS: 250.0000 mg | ORAL_TABLET | Freq: Every day | ORAL | 0 refills | Status: DC

## 2016-07-04 NOTE — Progress Notes (Signed)
Subjective:  Patient ID: Janice Huang, female    DOB: 1970/11/26  Age: 46 y.o. MRN: [id number]  CC: Sinus Problem (sinus pressure on right side face,running nose and congestion. took otc)   Sinus Problem  This is a new problem. The current episode started in the past 7 days. The problem has been gradually worsening since onset. There has been no fever. Associated symptoms include chills, congestion, coughing, ear pain, headaches, sinus pressure, a sore throat and swollen glands. Pertinent negatives include no hoarse voice, neck pain or shortness of breath. Past treatments include oral decongestants and spray decongestants. The treatment provided no relief.    Outpatient Medications Prior to Visit  Medication Sig Dispense Refill  . acetaminophen (TYLENOL) 500 MG tablet Take 1,000 mg by mouth every 6 (six) hours as needed for moderate pain.    Marland Kitchen. allopurinol (ZYLOPRIM) 300 MG tablet Take 1 tablet (300 mg total) by mouth daily. 90 tablet 1  . Cetirizine HCl (ZYRTEC ALLERGY) 10 MG CAPS Take 1 capsule (10 mg total) by mouth every morning. 90 capsule 1  . colchicine (COLCRYS) 0.6 MG tablet Take 1 tablet (0.6 mg total) by mouth 2 (two) times daily. 270 tablet 1  . lisinopril (PRINIVIL,ZESTRIL) 20 MG tablet TAKE 1 TABLET BY MOUTH DAILY 90 tablet 1  . Multiple Vitamin (MULTIVITAMIN WITH MINERALS) TABS tablet Take 1 tablet by mouth every morning.    . fluticasone (FLONASE) 50 MCG/ACT nasal spray Place 2 sprays into both nostrils daily. 16 g 2  . ipratropium (ATROVENT) 0.03 % nasal spray Place 2 sprays into both nostrils 2 (two) times daily. Do not use for more than 5days. 30 mL 12  . predniSONE (STERAPRED UNI-PAK 21 TAB) 10 MG (21) TBPK tablet Take 1 tablet (10 mg total) by mouth daily. 21 tablet 0   No facility-administered medications prior to visit.     ROS See HPI  Objective:  BP 124/80   Pulse 63   Temp 98.4 F (36.9 C)   Ht 5\' 7"  (1.702 m)   Wt 283 lb (128.4 kg)   SpO2 98%    BMI 44.32 kg/m   BP Readings from Last 3 Encounters:  07/04/16 124/80  05/02/16 140/80  02/07/16 (!) 142/82    Wt Readings from Last 3 Encounters:  07/04/16 283 lb (128.4 kg)  05/02/16 286 lb (129.7 kg)  02/07/16 282 lb 1.9 oz (128 kg)    Physical Exam  Constitutional: She is oriented to person, place, and time.  HENT:  Right Ear: Tympanic membrane, external ear and ear canal normal.  Left Ear: Tympanic membrane, external ear and ear canal normal.  Nose: Mucosal edema and rhinorrhea present. Right sinus exhibits maxillary sinus tenderness. Right sinus exhibits no frontal sinus tenderness. Left sinus exhibits maxillary sinus tenderness. Left sinus exhibits no frontal sinus tenderness.  Mouth/Throat: Uvula is midline. No trismus in the jaw. Posterior oropharyngeal erythema present. No oropharyngeal exudate.  Eyes: No scleral icterus.  Neck: Normal range of motion. Neck supple.  Cardiovascular: Normal rate and normal heart sounds.   Pulmonary/Chest: Effort normal and breath sounds normal.  Musculoskeletal: She exhibits no edema.  Lymphadenopathy:    She has cervical adenopathy.  Neurological: She is alert and oriented to person, place, and time.  Vitals reviewed.   Lab Results  Component Value Date   WBC 6.5 01/15/2015   HGB 12.4 01/15/2015   HCT 36 01/15/2015   PLT 225 01/15/2015   GLUCOSE 82 04/14/2015   CHOL 254 (  H) 04/14/2015   TRIG 85.0 04/14/2015   HDL 51.60 04/14/2015   LDLDIRECT 217.2 05/23/2013   LDLCALC 186 (H) 04/14/2015   ALT 27 01/15/2015   AST 24 01/15/2015   NA 138 04/14/2015   K 3.9 04/14/2015   CL 104 04/14/2015   CREATININE 0.68 04/14/2015   BUN 8 04/14/2015   CO2 24 04/14/2015   TSH 0.90 04/14/2015   HGBA1C 5.7 04/14/2015   MICROALBUR 2.8 (H) 04/14/2015    Mm Digital Screening Bilateral  Result Date: 09/18/2014 CLINICAL DATA:  Screening. EXAM: DIGITAL SCREENING BILATERAL MAMMOGRAM WITH CAD COMPARISON:  Previous exam(s). ACR Breast Density  Category b: There are scattered areas of fibroglandular density. FINDINGS: There are no findings suspicious for malignancy. Images were processed with CAD. IMPRESSION: No mammographic evidence of malignancy. A result letter of this screening mammogram will be mailed directly to the patient. RECOMMENDATION: Screening mammogram in one year. (Code:SM-B-01Y) BI-RADS CATEGORY  1: Negative. Electronically Signed   By: Britta Mccreedy M.D.   On: 09/21/2014 16:29    Assessment & Plan:   Amran was seen today for sinus problem.  Diagnoses and all orders for this visit:  Acute non-recurrent maxillary sinusitis -     sodium chloride (OCEAN) 0.65 % SOLN nasal spray; Place 1 spray into both nostrils as needed for congestion. -     guaiFENesin (MUCINEX) 600 MG 12 hr tablet; Take 1 tablet (600 mg total) by mouth 2 (two) times daily as needed for cough or to loosen phlegm. -     azithromycin (ZITHROMAX Z-PAK) 250 MG tablet; Take 1 tablet (250 mg total) by mouth daily. Take 2tabs on first day, then 1tab once a day till complete   I have discontinued Ms. Colaizzi's fluticasone, predniSONE, and ipratropium. I am also having her start on sodium chloride, guaiFENesin, and azithromycin. Additionally, I am having her maintain her acetaminophen, multivitamin with minerals, allopurinol, colchicine, Cetirizine HCl, and lisinopril.  Meds ordered this encounter  Medications  . sodium chloride (OCEAN) 0.65 % SOLN nasal spray    Sig: Place 1 spray into both nostrils as needed for congestion.    Dispense:  15 mL    Refill:  0    Order Specific Question:   Supervising Provider    Answer:   Tresa Garter [[id number]]  . guaiFENesin (MUCINEX) 600 MG 12 hr tablet    Sig: Take 1 tablet (600 mg total) by mouth 2 (two) times daily as needed for cough or to loosen phlegm.    Dispense:  14 tablet    Refill:  0    Order Specific Question:   Supervising Provider    Answer:   Tresa Garter [[id number]]  . azithromycin  (ZITHROMAX Z-PAK) 250 MG tablet    Sig: Take 1 tablet (250 mg total) by mouth daily. Take 2tabs on first day, then 1tab once a day till complete    Dispense:  6 tablet    Refill:  0    Order Specific Question:   Supervising Provider    Answer:   Tresa Garter [[id number]]    Follow-up: Return if symptoms worsen or fail to improve.  Alysia Penna, NP

## 2016-07-04 NOTE — Progress Notes (Signed)
Pre visit review using our clinic review tool, if applicable. No additional management support is needed unless otherwise documented below in the visit note. 

## 2016-07-04 NOTE — Patient Instructions (Signed)
URI Instructions: Encourage adequate oral hydration.  Use over-the-counter  "cold" medicines  such as "Tylenol cold" , "Advil cold",  "Mucinex" or" Mucinex D"  for cough and congestion.  Avoid decongestants if you have high blood pressure. Use" Delsym" or" Robitussin" cough syrup varietis for cough.  You can use plain "Tylenol" or "Advi"l for fever, chills and achyness.   

## 2016-07-20 ENCOUNTER — Other Ambulatory Visit: Payer: Self-pay | Admitting: Family

## 2016-08-10 ENCOUNTER — Other Ambulatory Visit: Payer: Self-pay | Admitting: Internal Medicine

## 2016-08-10 DIAGNOSIS — M1000 Idiopathic gout, unspecified site: Secondary | ICD-10-CM

## 2016-10-19 ENCOUNTER — Other Ambulatory Visit (INDEPENDENT_AMBULATORY_CARE_PROVIDER_SITE_OTHER): Payer: 59

## 2016-10-19 ENCOUNTER — Ambulatory Visit (INDEPENDENT_AMBULATORY_CARE_PROVIDER_SITE_OTHER): Payer: 59 | Admitting: Internal Medicine

## 2016-10-19 ENCOUNTER — Encounter: Payer: Self-pay | Admitting: Internal Medicine

## 2016-10-19 VITALS — BP 126/84 | HR 76 | Temp 98.2°F | Resp 16 | Ht 67.0 in | Wt 290.0 lb

## 2016-10-19 DIAGNOSIS — E785 Hyperlipidemia, unspecified: Secondary | ICD-10-CM | POA: Diagnosis not present

## 2016-10-19 DIAGNOSIS — M1000 Idiopathic gout, unspecified site: Secondary | ICD-10-CM

## 2016-10-19 DIAGNOSIS — I10 Essential (primary) hypertension: Secondary | ICD-10-CM | POA: Diagnosis not present

## 2016-10-19 DIAGNOSIS — Z0000 Encounter for general adult medical examination without abnormal findings: Secondary | ICD-10-CM | POA: Diagnosis not present

## 2016-10-19 DIAGNOSIS — R7303 Prediabetes: Secondary | ICD-10-CM

## 2016-10-19 DIAGNOSIS — Z1231 Encounter for screening mammogram for malignant neoplasm of breast: Secondary | ICD-10-CM

## 2016-10-19 LAB — CBC WITH DIFFERENTIAL/PLATELET
Basophils Absolute: 0.1 10*3/uL (ref 0.0–0.1)
Basophils Relative: 0.8 % (ref 0.0–3.0)
EOS PCT: 1.9 % (ref 0.0–5.0)
Eosinophils Absolute: 0.2 10*3/uL (ref 0.0–0.7)
HCT: 39.7 % (ref 36.0–46.0)
Hemoglobin: 13.2 g/dL (ref 12.0–15.0)
LYMPHS ABS: 3.7 10*3/uL (ref 0.7–4.0)
Lymphocytes Relative: 41 % (ref 12.0–46.0)
MCHC: 33.2 g/dL (ref 30.0–36.0)
MCV: 87.7 fl (ref 78.0–100.0)
MONO ABS: 0.9 10*3/uL (ref 0.1–1.0)
Monocytes Relative: 10.1 % (ref 3.0–12.0)
NEUTROS PCT: 46.2 % (ref 43.0–77.0)
Neutro Abs: 4.2 10*3/uL (ref 1.4–7.7)
Platelets: 226 10*3/uL (ref 150.0–400.0)
RBC: 4.52 Mil/uL (ref 3.87–5.11)
RDW: 13.9 % (ref 11.5–15.5)
WBC: 9.1 10*3/uL (ref 4.0–10.5)

## 2016-10-19 LAB — HEMOGLOBIN A1C: HEMOGLOBIN A1C: 6.1 % (ref 4.6–6.5)

## 2016-10-19 LAB — TSH: TSH: 0.85 u[IU]/mL (ref 0.35–4.50)

## 2016-10-19 NOTE — Progress Notes (Signed)
Subjective:  Patient ID: Janice Huang, female    DOB: 1971-04-17  Age: 46 y.o. MRN: [id number]  CC: Annual Exam; Hypertension; and Hyperlipidemia   HPI Janice Huang presents for a CPX. She is concerned about recent weight gain but otherwise feels well and offers no other complaints. Her blood pressure has been well controlled and she's had no recent episodes of chest pain, shortness of breath, palpitations, edema, or fatigue.  Outpatient Medications Prior to Visit  Medication Sig Dispense Refill  . acetaminophen (TYLENOL) 500 MG tablet Take 1,000 mg by mouth every 6 (six) hours as needed for moderate pain.    Marland Kitchen allopurinol (ZYLOPRIM) 300 MG tablet TAKE 1 TABLET BY MOUTH DAILY 90 tablet 0  . Cetirizine HCl (ZYRTEC ALLERGY) 10 MG CAPS Take 1 capsule (10 mg total) by mouth every morning. 90 capsule 1  . colchicine (COLCRYS) 0.6 MG tablet Take 1 tablet (0.6 mg total) by mouth 2 (two) times daily. 270 tablet 1  . azithromycin (ZITHROMAX Z-PAK) 250 MG tablet Take 1 tablet (250 mg total) by mouth daily. Take 2tabs on first day, then 1tab once a day till complete 6 tablet 0  . guaiFENesin (MUCINEX) 600 MG 12 hr tablet Take 1 tablet (600 mg total) by mouth 2 (two) times daily as needed for cough or to loosen phlegm. 14 tablet 0  . lisinopril (PRINIVIL,ZESTRIL) 20 MG tablet TAKE 1 TABLET BY MOUTH DAILY 90 tablet 1  . Multiple Vitamin (MULTIVITAMIN WITH MINERALS) TABS tablet Take 1 tablet by mouth every morning.    . sodium chloride (OCEAN) 0.65 % SOLN nasal spray Place 1 spray into both nostrils as needed for congestion. 15 mL 0   No facility-administered medications prior to visit.     ROS Review of Systems  Constitutional: Positive for unexpected weight change. Negative for appetite change, diaphoresis and fatigue.  HENT: Negative.  Negative for trouble swallowing.   Eyes: Negative for visual disturbance.  Respiratory: Negative.  Negative for cough, chest tightness, shortness  of breath and wheezing.   Cardiovascular: Negative.  Negative for chest pain, palpitations and leg swelling.  Gastrointestinal: Negative for abdominal pain, constipation, diarrhea, nausea and vomiting.  Endocrine: Negative.  Negative for cold intolerance, polydipsia, polyphagia and polyuria.  Genitourinary: Negative.  Negative for difficulty urinating, dysuria, vaginal bleeding and vaginal discharge.  Musculoskeletal: Negative.  Negative for arthralgias and myalgias.  Skin: Negative.   Allergic/Immunologic: Negative.   Neurological: Negative.  Negative for dizziness, weakness and headaches.  Hematological: Negative.  Negative for adenopathy. Does not bruise/bleed easily.  Psychiatric/Behavioral: Negative.     Objective:  BP 126/84 (BP Location: Left Arm, Patient Position: Sitting, Cuff Size: Large)   Pulse 76   Temp 98.2 F (36.8 C) (Oral)   Resp 16   Ht 5\' 7"  (1.702 m)   Wt 290 lb (131.5 kg)   SpO2 99%   BMI 45.42 kg/m   BP Readings from Last 3 Encounters:  10/19/16 126/84  07/04/16 124/80  05/02/16 140/80    Wt Readings from Last 3 Encounters:  10/19/16 290 lb (131.5 kg)  07/04/16 283 lb (128.4 kg)  05/02/16 286 lb (129.7 kg)    Physical Exam  Constitutional: She is oriented to person, place, and time. No distress.  HENT:  Mouth/Throat: Oropharynx is clear and moist. No oropharyngeal exudate.  Eyes: Conjunctivae are normal. Right eye exhibits no discharge. Left eye exhibits no discharge. No scleral icterus.  Neck: Normal range of motion. Neck supple. No JVD  present. No tracheal deviation present. No thyromegaly present.  Cardiovascular: Normal rate, regular rhythm, normal heart sounds and intact distal pulses.  Exam reveals no gallop and no friction rub.   No murmur heard. Pulmonary/Chest: Effort normal and breath sounds normal. No stridor. No respiratory distress. She has no wheezes. She has no rales. She exhibits no tenderness.  Abdominal: Soft. Bowel sounds are  normal. She exhibits no distension and no mass. There is no tenderness. There is no rebound and no guarding.  Musculoskeletal: Normal range of motion. She exhibits no edema, tenderness or deformity.  Lymphadenopathy:    She has no cervical adenopathy.  Neurological: She is alert and oriented to person, place, and time.  Skin: Skin is warm and dry. No rash noted. She is not diaphoretic. No erythema. No pallor.  Vitals reviewed.   Lab Results  Component Value Date   WBC 9.1 10/19/2016   HGB 13.2 10/19/2016   HCT 39.7 10/19/2016   PLT 226.0 10/19/2016   GLUCOSE 84 10/19/2016   CHOL 285 (H) 10/19/2016   TRIG 98.0 10/19/2016   HDL 58.20 10/19/2016   LDLDIRECT 217.2 05/23/2013   LDLCALC 207 (H) 10/19/2016   ALT 13 10/19/2016   AST 17 10/19/2016   NA 140 10/19/2016   K 4.0 10/19/2016   CL 104 10/19/2016   CREATININE 0.75 10/19/2016   BUN 12 10/19/2016   CO2 27 10/19/2016   TSH 0.85 10/19/2016   HGBA1C 6.1 10/19/2016   MICROALBUR 2.8 (H) 04/14/2015    Mm Digital Screening Bilateral  Result Date: 09/18/2014 CLINICAL DATA:  Screening. EXAM: DIGITAL SCREENING BILATERAL MAMMOGRAM WITH CAD COMPARISON:  Previous exam(s). ACR Breast Density Category b: There are scattered areas of fibroglandular density. FINDINGS: There are no findings suspicious for malignancy. Images were processed with CAD. IMPRESSION: No mammographic evidence of malignancy. A result letter of this screening mammogram will be mailed directly to the patient. RECOMMENDATION: Screening mammogram in one year. (Code:SM-B-01Y) BI-RADS CATEGORY  1: Negative. Electronically Signed   By: Janice MccreedySusan  Huang M.D.   On: 09/21/2014 16:29    Assessment & Plan:   Janice Huang was seen today for annual exam, hypertension and hyperlipidemia.  Diagnoses and all orders for this visit:  Essential hypertension, benign- Her BP is well controlled, lytes and renal fxn are normal -     lisinopril (PRINIVIL,ZESTRIL) 20 MG tablet; Take 1 tablet (20  mg total) by mouth daily.  Prediabetes- her A1c is slightly elevated at 6.1%, she is prediabetic and status post. Trig surgery, I've asked her to improve on her lifestyle modifications. -     Hemoglobin A1c; Future  Routine general medical examination at a health care facility- exam completed, labs ordered and reviewed, vaccines reviewed and updated, she is not due for Pap smear colon cancer screening, she was referred for mammogram, patient education material was given. -     Comprehensive metabolic panel; Future -     CBC with Differential/Platelet; Future -     Lipid panel; Future -     TSH; Future  Idiopathic gout, unspecified chronicity, unspecified site- she's had no recent attacks of gouty arthropathy and has achieved her uric acid goal. Will continue allopurinol at the current dose  -     Uric acid; Future  Visit for screening mammogram -     MM DIGITAL SCREENING BILATERAL; Future  Hyperlipidemia with target LDL less than 130- her total cholesterol is up to 235 and her LDL is up to 207, this is consistent  with familial hypercholesterolemia and of asked her to start taking a statin for cardiovascular risk reduction. -     rosuvastatin (CRESTOR) 20 MG tablet; Take 1 tablet (20 mg total) by mouth daily.  Essential hypertension   I have discontinued Ms. Haft's multivitamin with minerals, sodium chloride, guaiFENesin, and azithromycin. I have also changed her lisinopril. Additionally, I am having her start on rosuvastatin. Lastly, I am having her maintain her acetaminophen, colchicine, Cetirizine HCl, and allopurinol.  Meds ordered this encounter  Medications  . rosuvastatin (CRESTOR) 20 MG tablet    Sig: Take 1 tablet (20 mg total) by mouth daily.    Dispense:  90 tablet    Refill:  3  . lisinopril (PRINIVIL,ZESTRIL) 20 MG tablet    Sig: Take 1 tablet (20 mg total) by mouth daily.    Dispense:  90 tablet    Refill:  1     Follow-up: Return in about 6 months (around  04/20/2017).  Sanda Linger, MD

## 2016-10-19 NOTE — Patient Instructions (Signed)

## 2016-10-20 ENCOUNTER — Encounter: Payer: Self-pay | Admitting: Internal Medicine

## 2016-10-20 LAB — COMPREHENSIVE METABOLIC PANEL
ALK PHOS: 61 U/L (ref 39–117)
ALT: 13 U/L (ref 0–35)
AST: 17 U/L (ref 0–37)
Albumin: 4.2 g/dL (ref 3.5–5.2)
BILIRUBIN TOTAL: 0.3 mg/dL (ref 0.2–1.2)
BUN: 12 mg/dL (ref 6–23)
CO2: 27 mEq/L (ref 19–32)
CREATININE: 0.75 mg/dL (ref 0.40–1.20)
Calcium: 9.2 mg/dL (ref 8.4–10.5)
Chloride: 104 mEq/L (ref 96–112)
GFR: 106.92 mL/min (ref >60.00)
GLUCOSE: 84 mg/dL (ref 70–99)
Potassium: 4 mEq/L (ref 3.5–5.1)
SODIUM: 140 meq/L (ref 135–145)
TOTAL PROTEIN: 7.3 g/dL (ref 6.0–8.3)

## 2016-10-20 LAB — LIPID PANEL
CHOLESTEROL: 285 mg/dL — AB (ref 0–200)
HDL: 58.2 mg/dL (ref >39.00)
LDL Cholesterol: 207 mg/dL — ABNORMAL HIGH (ref 0–99)
NonHDL: 227.07
Total CHOL/HDL Ratio: 5
Triglycerides: 98 mg/dL (ref 0.0–149.0)
VLDL: 19.6 mg/dL (ref 0.0–40.0)

## 2016-10-20 LAB — URIC ACID: Uric Acid, Serum: 5.7 mg/dL (ref 2.4–7.0)

## 2016-10-20 MED ORDER — ROSUVASTATIN CALCIUM 20 MG PO TABS: 20.0000 mg | ORAL_TABLET | Freq: Every day | ORAL | 3 refills | Status: DC

## 2016-10-21 MED ORDER — LISINOPRIL 20 MG PO TABS: 20.0000 mg | ORAL_TABLET | Freq: Every day | ORAL | 1 refills | Status: DC

## 2016-11-28 ENCOUNTER — Other Ambulatory Visit: Payer: Self-pay | Admitting: Internal Medicine

## 2016-11-28 DIAGNOSIS — M1000 Idiopathic gout, unspecified site: Secondary | ICD-10-CM

## 2016-12-04 ENCOUNTER — Ambulatory Visit
Admission: RE | Admit: 2016-12-04 | Discharge: 2016-12-04 | Disposition: A | Discharge status: Home / Self Care | Payer: 59 | Source: Ambulatory Visit | Attending: Internal Medicine | Admitting: Internal Medicine

## 2016-12-04 DIAGNOSIS — Z1231 Encounter for screening mammogram for malignant neoplasm of breast: Secondary | ICD-10-CM

## 2016-12-05 LAB — HM MAMMOGRAPHY

## 2017-02-11 ENCOUNTER — Other Ambulatory Visit: Payer: Self-pay | Admitting: Internal Medicine

## 2017-02-11 DIAGNOSIS — M1000 Idiopathic gout, unspecified site: Secondary | ICD-10-CM

## 2017-03-07 ENCOUNTER — Telehealth: Payer: Self-pay | Admitting: Internal Medicine

## 2017-03-07 NOTE — Telephone Encounter (Signed)
Pt called stating last week someone called and told her her Wellness Form was ready to be picked up but when she got here no one could locate her form, she called asking if it had been found, I told her I would send a message and ask, she became upset and said she would refax a new one today and she needs it emailed back, I informed her we do not have a way of emailing and aksed her for a fax number, she stated she would put a return fax number on her form. She is putting attn Steffannie on this form.

## 2017-03-07 NOTE — Telephone Encounter (Signed)
Patient would like to know if copy was kept [phone].

## 2017-03-08 NOTE — Telephone Encounter (Signed)
Called and LVM for patient to call back.   Form has not made it to scan yet. It looks like forms has been mailed after the cabinet was cleaned out?

## 2017-03-09 NOTE — Telephone Encounter (Signed)
Called again to follow up with patient. To see if she was going to faxed the form or if she has got the one in the mail.

## 2017-03-09 NOTE — Telephone Encounter (Signed)
I called [facility] to see if patient is needing a form again to be filled. They stated they have everything they need for her. No further action is needed at this time.

## 2017-04-11 ENCOUNTER — Ambulatory Visit: Payer: 59 | Admitting: Internal Medicine

## 2017-04-17 ENCOUNTER — Encounter: Payer: Self-pay | Admitting: Internal Medicine

## 2017-04-17 ENCOUNTER — Ambulatory Visit: Payer: 59 | Admitting: Internal Medicine

## 2017-04-17 ENCOUNTER — Other Ambulatory Visit (INDEPENDENT_AMBULATORY_CARE_PROVIDER_SITE_OTHER): Payer: 59

## 2017-04-17 VITALS — BP 130/70 | HR 63 | Temp 98.0°F | Resp 16 | Ht 67.0 in | Wt 289.0 lb

## 2017-04-17 DIAGNOSIS — R7303 Prediabetes: Secondary | ICD-10-CM

## 2017-04-17 DIAGNOSIS — E785 Hyperlipidemia, unspecified: Secondary | ICD-10-CM

## 2017-04-17 DIAGNOSIS — I10 Essential (primary) hypertension: Secondary | ICD-10-CM | POA: Diagnosis not present

## 2017-04-17 DIAGNOSIS — Z124 Encounter for screening for malignant neoplasm of cervix: Secondary | ICD-10-CM | POA: Insufficient documentation

## 2017-04-17 DIAGNOSIS — Z0000 Encounter for general adult medical examination without abnormal findings: Secondary | ICD-10-CM

## 2017-04-17 LAB — HEMOGLOBIN A1C: HEMOGLOBIN A1C: 5.9 % (ref 4.6–6.5)

## 2017-04-17 MED ORDER — PITAVASTATIN CALCIUM 4 MG PO TABS
1.0000 | ORAL_TABLET | Freq: Every day | ORAL | 1 refills | Status: DC
Start: 2017-04-17 — End: 2017-08-16

## 2017-04-17 NOTE — Patient Instructions (Signed)

## 2017-04-17 NOTE — Progress Notes (Signed)
Subjective:  Patient ID: Janice Huang, female    DOB: 12/09/1970  Age: 46 y.o. MRN: [id number]  CC: Hypertension and Hyperlipidemia   HPI Janice Huang presents for f/up - She feels well and offers no complaints.  Unfortunately she has not been taking the statin.  She tried taking Crestor but it gave her a bad taste in the mouth so she did not continue taking it.  She tells me her blood pressure has been well controlled on the ACE inhibitor.  Outpatient Medications Prior to Visit  Medication Sig Dispense Refill  . acetaminophen (TYLENOL) 500 MG tablet Take 1,000 mg by mouth every 6 (six) hours as needed for moderate pain.    Marland Kitchen. allopurinol (ZYLOPRIM) 300 MG tablet TAKE 1 TABLET BY MOUTH DAILY 90 tablet 1  . Cetirizine HCl (ZYRTEC ALLERGY) 10 MG CAPS Take 1 capsule (10 mg total) by mouth every morning. 90 capsule 1  . COLCRYS 0.6 MG tablet TAKE 1 TABLET BY MOUTH TWICE A DAY 270 tablet 0  . lisinopril (PRINIVIL,ZESTRIL) 20 MG tablet Take 1 tablet (20 mg total) by mouth daily. 90 tablet 1  . rosuvastatin (CRESTOR) 20 MG tablet Take 1 tablet (20 mg total) by mouth daily. 90 tablet 3   No facility-administered medications prior to visit.     ROS Review of Systems  Constitutional: Negative.  Negative for diaphoresis, fatigue and unexpected weight change.  HENT: Negative.   Eyes: Negative for visual disturbance.  Respiratory: Negative for chest tightness and shortness of breath.   Cardiovascular: Negative.  Negative for chest pain, palpitations and leg swelling.  Gastrointestinal: Negative.  Negative for abdominal pain.  Endocrine: Negative.   Genitourinary: Negative.  Negative for difficulty urinating.  Musculoskeletal: Negative.  Negative for arthralgias and myalgias.  Skin: Negative.  Negative for color change and rash.  Allergic/Immunologic: Negative.   Neurological: Negative.  Negative for dizziness, weakness and light-headedness.  Hematological: Negative for  adenopathy. Does not bruise/bleed easily.  Psychiatric/Behavioral: Negative.     Objective:  BP 130/70 (BP Location: Left Arm, Patient Position: Sitting, Cuff Size: Large)   Pulse 63   Temp 98 F (36.7 C) (Oral)   Resp 16   Ht 5\' 7"  (1.702 m)   Wt 289 lb (131.1 kg)   SpO2 100%   BMI 45.26 kg/m   BP Readings from Last 3 Encounters:  04/17/17 130/70  10/19/16 126/84  07/04/16 124/80    Wt Readings from Last 3 Encounters:  04/17/17 289 lb (131.1 kg)  10/19/16 290 lb (131.5 kg)  07/04/16 283 lb (128.4 kg)    Physical Exam  Constitutional: She is oriented to person, place, and time. No distress.  HENT:  Mouth/Throat: Oropharynx is clear and moist. No oropharyngeal exudate.  Eyes: Conjunctivae are normal. Right eye exhibits no discharge. Left eye exhibits no discharge. No scleral icterus.  Neck: Normal range of motion. Neck supple. No JVD present. No thyromegaly present.  Cardiovascular: Normal rate, regular rhythm and intact distal pulses.  No murmur heard. Pulmonary/Chest: Effort normal and breath sounds normal. She has no wheezes. She has no rales.  Abdominal: Soft. Bowel sounds are normal. She exhibits no mass. There is no tenderness. There is no guarding.  Musculoskeletal: Normal range of motion. She exhibits no edema, tenderness or deformity.  Neurological: She is alert and oriented to person, place, and time.  Skin: Skin is warm and dry. She is not diaphoretic.  Vitals reviewed.   Lab Results  Component Value Date  WBC 9.1 10/19/2016   HGB 13.2 10/19/2016   HCT 39.7 10/19/2016   PLT 226.0 10/19/2016   GLUCOSE 86 04/17/2017   CHOL 302 (H) 04/17/2017   TRIG 122.0 04/17/2017   HDL 52.90 04/17/2017   LDLDIRECT 217.2 05/23/2013   LDLCALC 225 (H) 04/17/2017   ALT 15 04/17/2017   AST 19 04/17/2017   NA 137 04/17/2017   K 4.2 04/17/2017   CL 102 04/17/2017   CREATININE 0.74 04/17/2017   BUN 12 04/17/2017   CO2 29 04/17/2017   TSH 0.85 10/19/2016   HGBA1C 5.9  04/17/2017   MICROALBUR 2.8 (H) 04/14/2015    Mm Digital Screening Bilateral  Result Date: 12/05/2016 CLINICAL DATA:  Screening. EXAM: DIGITAL SCREENING BILATERAL MAMMOGRAM WITH CAD COMPARISON:  Previous exam(s). ACR Breast Density Category b: There are scattered areas of fibroglandular density. FINDINGS: There are no findings suspicious for malignancy. Images were processed with CAD. IMPRESSION: No mammographic evidence of malignancy. A result letter of this screening mammogram will be mailed directly to the patient. RECOMMENDATION: Screening mammogram in one year. (Code:SM-B-01Y) BI-RADS CATEGORY  1: Negative. Electronically Signed   By: Ted Mcalpineobrinka  Dimitrova M.D.   On: 12/05/2016 10:27    Assessment & Plan:   Janice Huang was seen today for hypertension and hyperlipidemia.  Diagnoses and all orders for this visit:  Hyperlipidemia with target LDL less than 130- She has a mildly elevated ASCVD risk score so I have asked her to start taking a statin for CV risk reduction. -     Lipid panel; Future -     Pitavastatin Calcium (LIVALO) 4 MG TABS; Take 1 tablet (4 mg total) by mouth daily.  Essential hypertension, benign- Her blood pressure is well controlled.  Electrolytes and renal function are normal. -     Comprehensive metabolic panel; Future  Prediabetes- Her A1c is down to 5.9%.  She was praised for the improvement.  She will continue to work on her lifestyle modifications. -     Comprehensive metabolic panel; Future -     Hemoglobin A1c; Future  Routine general medical examination at a health care facility -     HIV antibody; Future  Screening for cervical cancer -     Ambulatory referral to Gynecology   I have discontinued Janice Huang's rosuvastatin. I am also having her start on Pitavastatin Calcium. Additionally, I am having her maintain her acetaminophen, Cetirizine HCl, lisinopril, allopurinol, and COLCRYS.  Meds ordered this encounter  Medications  . Pitavastatin  Calcium (LIVALO) 4 MG TABS    Sig: Take 1 tablet (4 mg total) by mouth daily.    Dispense:  90 tablet    Refill:  1     Follow-up: Return in about 4 months (around 08/15/2017).  Sanda Lingerhomas Yang Rack, MD

## 2017-04-18 ENCOUNTER — Encounter: Payer: Self-pay | Admitting: Internal Medicine

## 2017-04-18 LAB — COMPREHENSIVE METABOLIC PANEL
ALT: 15 U/L (ref 0–35)
AST: 19 U/L (ref 0–37)
Albumin: 4.2 g/dL (ref 3.5–5.2)
Alkaline Phosphatase: 62 U/L (ref 39–117)
BUN: 12 mg/dL (ref 6–23)
CHLORIDE: 102 meq/L (ref 96–112)
CO2: 29 meq/L (ref 19–32)
CREATININE: 0.74 mg/dL (ref 0.40–1.20)
Calcium: 9.3 mg/dL (ref 8.4–10.5)
GFR: 108.36 mL/min (ref >60.00)
Glucose, Bld: 86 mg/dL (ref 70–99)
POTASSIUM: 4.2 meq/L (ref 3.5–5.1)
SODIUM: 137 meq/L (ref 135–145)
Total Bilirubin: 0.3 mg/dL (ref 0.2–1.2)
Total Protein: 7.4 g/dL (ref 6.0–8.3)

## 2017-04-18 LAB — LIPID PANEL
CHOL/HDL RATIO: 6
CHOLESTEROL: 302 mg/dL — AB (ref 0–200)
HDL: 52.9 mg/dL (ref >39.00)
LDL CALC: 225 mg/dL — AB (ref 0–99)
NonHDL: 249.31
Triglycerides: 122 mg/dL (ref 0.0–149.0)
VLDL: 24.4 mg/dL (ref 0.0–40.0)

## 2017-04-18 LAB — HIV ANTIBODY (ROUTINE TESTING W REFLEX): HIV: NONREACTIVE

## 2017-05-03 ENCOUNTER — Encounter (HOSPITAL_COMMUNITY): Payer: Self-pay

## 2017-05-17 ENCOUNTER — Ambulatory Visit: Payer: Self-pay | Admitting: Obstetrics & Gynecology

## 2017-06-20 ENCOUNTER — Encounter: Payer: Self-pay | Admitting: Obstetrics & Gynecology

## 2017-06-20 ENCOUNTER — Ambulatory Visit (INDEPENDENT_AMBULATORY_CARE_PROVIDER_SITE_OTHER): Payer: Managed Care, Other (non HMO) | Admitting: Obstetrics & Gynecology

## 2017-06-20 VITALS — BP 132/88 | Ht 67.0 in | Wt 288.0 lb

## 2017-06-20 DIAGNOSIS — Z01411 Encounter for gynecological examination (general) (routine) with abnormal findings: Secondary | ICD-10-CM

## 2017-06-20 DIAGNOSIS — Z113 Encounter for screening for infections with a predominantly sexual mode of transmission: Secondary | ICD-10-CM | POA: Diagnosis not present

## 2017-06-20 DIAGNOSIS — Z90710 Acquired absence of both cervix and uterus: Secondary | ICD-10-CM

## 2017-06-20 NOTE — Progress Notes (Signed)
Janice Huang [birth year] [id number]   History:    47 y.o. [id number] Single.  New boyfriend x 6 months.  Son 60 yo, daughters 2 and 53 yo.  Has a 2 yo Grand-child.  RP:  New patient presenting for annual gyn exam   HPI: S/P TAH/LSO for Fibroids in 2010.  No menopausal Sx. no pelvic pain.  No pain with intercourse.  Breasts normal.  Urine and bowel movements normal.  Not exercising regularly currently.  Gastric sleeve surgery 09/2014, but still BMI 45.11.    Past medical history,surgical history, family history and social history were all reviewed and documented in the EPIC chart.  Gynecologic History No LMP recorded. Patient has had a hysterectomy. Contraception: status post hysterectomy Last Pap: 2014. Results were: normal Last mammogram: 2018. Results were: normal Bone Density: Never Colonoscopy: Never  Obstetric History OB History  Gravida Para Term Preterm AB Living  4 3     1 3   SAB TAB Ectopic Multiple Live Births  1            # Outcome Date GA Lbr Len/2nd Weight Sex Delivery Anes PTL Lv  4 SAB           3 Para           2 Para           1 Para                ROS: A ROS was performed and pertinent positives and negatives are included in the history.  GENERAL: No fevers or chills. HEENT: No change in vision, no earache, sore throat or sinus congestion. NECK: No pain or stiffness. CARDIOVASCULAR: No chest pain or pressure. No palpitations. PULMONARY: No shortness of breath, cough or wheeze. GASTROINTESTINAL: No abdominal pain, nausea, vomiting or diarrhea, melena or bright red blood per rectum. GENITOURINARY: No urinary frequency, urgency, hesitancy or dysuria. MUSCULOSKELETAL: No joint or muscle pain, no back pain, no recent trauma. DERMATOLOGIC: No rash, no itching, no lesions. ENDOCRINE: No polyuria, polydipsia, no heat or cold intolerance. No recent change in weight. HEMATOLOGICAL: No anemia or easy bruising or bleeding. NEUROLOGIC: No headache, seizures,  numbness, tingling or weakness. PSYCHIATRIC: No depression, no loss of interest in normal activity or change in sleep pattern.     Exam:   BP 132/88   Ht 5\' 7"  (1.702 m)   Wt 288 lb (130.6 kg)   BMI 45.11 kg/m   Body mass index is 45.11 kg/m.  General appearance : Well developed well nourished female. No acute distress HEENT: Eyes: no retinal hemorrhage or exudates,  Neck supple, trachea midline, no carotid bruits, no thyroidmegaly Lungs: Clear to auscultation, no rhonchi or wheezes, or rib retractions  Heart: Regular rate and rhythm, no murmurs or gallops Breast:Examined in sitting and supine position were symmetrical in appearance, no palpable masses or tenderness,  no skin retraction, no nipple inversion, no nipple discharge, no skin discoloration, no axillary or supraclavicular lymphadenopathy Abdomen: no palpable masses or tenderness, no rebound or guarding Extremities: no edema or skin discoloration or tenderness  Pelvic: Vulva: Normal             Vagina: No gross lesions or discharge.  Pap reflex done.  Cervix/Uterus absent  Adnexa  Without masses or tenderness  Anus: Normal   Assessment/Plan:  47 y.o. female for annual exam   1. Encounter for gynecological examination with abnormal finding Gynecologic exam status post total abdominal hysterectomy with left salpingo-oophorectomy.  Pap reflex done on the vaginal vault.  Breast exam normal.  Will schedule screening mammogram when due.  Health labs with family physician. - Pap IG, CT/NG w/ reflex HPV when ASC-U  2. S/P TAH (total abdominal hysterectomy)  3. Screen for STD (sexually transmitted disease) Condom use recommended. - RPR - Hepatitis B Surface AntiGEN - Hepatitis C Antibody - Pap IG, CT/NG w/ reflex HPV when ASC-U  Genia DelMarie-Lyne Shauntelle Jamerson MD, 3:01 PM 06/20/2017

## 2017-06-21 ENCOUNTER — Other Ambulatory Visit: Payer: Self-pay | Admitting: Internal Medicine

## 2017-06-21 DIAGNOSIS — I10 Essential (primary) hypertension: Secondary | ICD-10-CM

## 2017-06-22 ENCOUNTER — Emergency Department (HOSPITAL_COMMUNITY): Payer: Managed Care, Other (non HMO)

## 2017-06-22 ENCOUNTER — Emergency Department (HOSPITAL_COMMUNITY)
Admission: EM | Admit: 2017-06-22 | Discharge: 2017-06-22 | Disposition: A | Discharge status: Home / Self Care | Payer: Managed Care, Other (non HMO) | Attending: Emergency Medicine | Admitting: Emergency Medicine

## 2017-06-22 ENCOUNTER — Encounter (HOSPITAL_COMMUNITY): Payer: Self-pay

## 2017-06-22 DIAGNOSIS — R079 Chest pain, unspecified: Secondary | ICD-10-CM

## 2017-06-22 DIAGNOSIS — Z79899 Other long term (current) drug therapy: Secondary | ICD-10-CM | POA: Insufficient documentation

## 2017-06-22 DIAGNOSIS — I10 Essential (primary) hypertension: Secondary | ICD-10-CM | POA: Diagnosis not present

## 2017-06-22 LAB — CBC
HCT: 37.6 % (ref 36.0–46.0)
Hemoglobin: 12.4 g/dL (ref 12.0–15.0)
MCH: 29 pg (ref 26.0–34.0)
MCHC: 33 g/dL (ref 30.0–36.0)
MCV: 88.1 fL (ref 78.0–100.0)
PLATELETS: 278 10*3/uL (ref 150–400)
RBC: 4.27 MIL/uL (ref 3.87–5.11)
RDW: 13.5 % (ref 11.5–15.5)
WBC: 7.5 10*3/uL (ref 4.0–10.5)

## 2017-06-22 LAB — I-STAT BETA HCG BLOOD, ED (MC, WL, AP ONLY): I-stat hCG, quantitative: <5 m[IU]/mL (ref <5)

## 2017-06-22 LAB — BASIC METABOLIC PANEL WITH GFR
Anion gap: 11 (ref 5–15)
BUN: 13 mg/dL (ref 6–20)
CO2: 22 mmol/L (ref 22–32)
Calcium: 8.9 mg/dL (ref 8.9–10.3)
Chloride: 104 mmol/L (ref 101–111)
Creatinine, Ser: 0.78 mg/dL (ref 0.44–1.00)
GFR calc Af Amer: >60 mL/min
GFR calc non Af Amer: >60 mL/min
Glucose, Bld: 91 mg/dL (ref 65–99)
Potassium: 3.9 mmol/L (ref 3.5–5.1)
Sodium: 137 mmol/L (ref 135–145)

## 2017-06-22 LAB — I-STAT TROPONIN, ED
Troponin i, poc: 0 ng/mL (ref 0.00–0.08)
Troponin i, poc: 0 ng/mL (ref 0.00–0.08)

## 2017-06-22 LAB — HM PAP SMEAR

## 2017-06-22 NOTE — ED Triage Notes (Signed)
Per Pt, Pt is coming from home with complaints of chest tightness and SOB that started today at 1720. Pt was given 324 mg of ASA and 1 Nitro. Pain decreased from 7-0. Reports some of these symptoms last night that subsided and returned today. Hx of HTN and High Stress. Vitals per EMS: 146/76, 70 HR, 100% on RA.   22 R AC

## 2017-06-22 NOTE — ED Provider Notes (Signed)
[facility]   CSN: [id number] Arrival date & time: 06/22/17  1845     History   Chief Complaint Chief Complaint  Patient presents with  . Chest Pain    HPI Janice Huang is a 47 y.o. female.   Chest Pain   This is a new problem. The current episode started 1 to 2 hours ago. The problem occurs constantly. The problem has been resolved. The pain is associated with an emotional upset (argument with teenaged son). The pain is present in the substernal region. The pain is moderate. The quality of the pain is described as heavy and pressure-like. The pain does not radiate. Duration of episode(s) is 20 minutes. Associated symptoms include nausea. Pertinent negatives include no abdominal pain, no back pain, no cough, no diaphoresis, no dizziness, no fever, no irregular heartbeat, no lower extremity edema, no malaise/fatigue, no palpitations, no shortness of breath, no syncope, no vomiting and no weakness. She has tried nitroglycerin for the symptoms. Improvement on treatment: unclear - patient states sx resolving prior to administration. Risk factors include obesity and stress.  Pertinent negatives for past medical history include no seizures.    Past Medical History:  Diagnosis Date  . Gout attack    last flare up last week, in toes  . Headache   . History of hiatal hernia    small  . Hyperlipidemia   . Hypertension     Patient Active Problem List   Diagnosis Date Noted  . Screening for cervical cancer 04/17/2017  . Routine general medical examination at a health care facility 10/19/2016  . Bilateral ankle pain 05/07/2016  . 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 Prediabetes 05/06/2012  . Gout 05/06/2012  . Morbid obesity (HCC) 06/15/2011  . Essential hypertension, benign 12/16/2010  . Hyperlipidemia with target LDL less than 130 12/16/2010  . Visit for screening mammogram 12/16/2010  . Allergic  rhinitis 04/16/2010    Past Surgical History:  Procedure Laterality Date  .  c section x 2    . ABDOMINAL HYSTERECTOMY     1 ovary removed  . LAPAROSCOPIC GASTRIC SLEEVE RESECTION N/A 09/21/2014   Procedure: LAPAROSCOPIC GASTRIC SLEEVE RESECTION WITH HIATAL HERNIA REPAIR;  Surgeon: Luretha MurphyMatthew Martin, MD;  Location: [facility];  Service: General;  Laterality: N/A;  . TUBAL LIGATION      OB History    Gravida Para Term Preterm AB Living   4 3     1 3    SAB TAB Ectopic Multiple Live Births   1               Home Medications    Prior to Admission medications   Medication Sig Start Date End Date Taking? Authorizing Provider  acetaminophen (TYLENOL) 500 MG tablet Take 1,000 mg by mouth every 6 (six) hours as needed for moderate pain.    [provider]  allopurinol (ZYLOPRIM) 300 MG tablet TAKE 1 TABLET BY MOUTH DAILY 11/28/16   Etta GrandchildJones, Thomas L, MD  Cetirizine HCl (ZYRTEC ALLERGY) 10 MG CAPS Take 1 capsule (10 mg total) by mouth every morning. 03/17/16   Etta GrandchildJones, Thomas L, MD  COLCRYS 0.6 MG tablet TAKE 1 TABLET BY MOUTH TWICE A DAY 02/12/17   Etta GrandchildJones, Thomas L, MD  lisinopril (PRINIVIL,ZESTRIL) 20 MG tablet TAKE 1 TABLET BY MOUTH DAILY 06/21/17   Etta GrandchildJones, Thomas L, MD  Pitavastatin Calcium (LIVALO) 4 MG TABS Take 1 tablet (4 mg total) by mouth  daily. 04/17/17   Etta Grandchild, MD    Family History Family History  Problem Relation Age of Onset  . Hypertension Other   . Diabetes Other        type 2  . Diabetes Maternal Grandmother   . Hypertension Maternal Grandfather   . Diabetes Paternal Grandmother   . Cancer Neg Hx   . Heart disease Neg Hx   . Hyperlipidemia Neg Hx   . Stroke Neg Hx     Social History Social History   Tobacco Use  . Smoking status: Never Smoker  . Smokeless tobacco: Never Used  Substance Use Topics  . Alcohol use: No    Alcohol/week: 0.0 oz  . Drug use: No     Allergies   Codeine and Penicillins   Review of Systems Review of Systems    Constitutional: Negative for chills, diaphoresis, fever and malaise/fatigue.  HENT: Negative for ear pain and sore throat.   Eyes: Negative for pain and visual disturbance.  Respiratory: Negative for cough and shortness of breath.   Cardiovascular: Positive for chest pain. Negative for palpitations and syncope.  Gastrointestinal: Positive for nausea. Negative for abdominal pain and vomiting.  Genitourinary: Negative for dysuria and hematuria.  Musculoskeletal: Negative for arthralgias and back pain.  Skin: Negative for color change and rash.  Neurological: Negative for dizziness, seizures, syncope and weakness.  All other systems reviewed and are negative.    Physical Exam Updated Vital Signs BP 130/77 (BP Location: Right Arm)   Pulse 62   Temp 98.2 F (36.8 C) (Oral)   Resp 15   Ht 5\' 7"  (1.702 m)   Wt 130.6 kg (288 lb)   SpO2 100%   BMI 45.11 kg/m   Physical Exam  Constitutional: She appears well-developed and well-nourished. No distress.  HENT:  Head: Normocephalic and atraumatic.  Eyes: Conjunctivae and EOM are normal. Pupils are equal, round, and reactive to light.  Neck: Normal range of motion. Neck supple.  Cardiovascular: Normal rate and regular rhythm.  Pulmonary/Chest: Effort normal and breath sounds normal. No respiratory distress.  Abdominal: Soft. There is no tenderness.  Musculoskeletal: She exhibits no edema.  Neurological: She is alert.  Skin: Skin is warm and dry.  Psychiatric: She has a normal mood and affect.  Nursing note and vitals reviewed.    ED Treatments / Results  Labs (all labs ordered are listed, but only abnormal results are displayed) Labs Reviewed  BASIC METABOLIC PANEL  CBC  I-STAT TROPONIN, ED  I-STAT BETA HCG BLOOD, ED ([facility], [facility], [facility] ONLY)    EKG  EKG Interpretation  Date/Time:  Friday June 22 2017 18:57:26 EST Ventricular Rate:  60 PR Interval:    QRS Duration: 83 QT Interval:  433 QTC Calculation: 433 R  Axis:   39 Text Interpretation:  Sinus rhythm Probable left atrial enlargement Nonspecific T abnormalities, lateral leads When compard to prior, t wave inversion in leads V4-V6 No STEMI Confirmed by Theda Belfast ([id number]) on 06/22/2017 8:17:48 PM       Radiology No results found.  Procedures Procedures (including critical care time)  Medications Ordered in ED Medications - No data to display   Initial Impression / Assessment and Plan / ED Course  I have reviewed the triage vital signs and the nursing notes.  Pertinent labs & imaging results that were available during my care of the patient were reviewed by me and considered in my medical decision making (see chart for details).  Ms. Utke is a 47 year old female with past medical history significant for obesity, gout, headache, hypertension, hyperlipidemia who presents for chest pain.  Patient is currently chest pain-free.  EKG obtained and demonstrates sinus rhythm with nonspecific T wave changes.  Labs obtained including CBC, BMP, delta troponin, beta hCG.  Results are significant for negative delta troponin.  Chest x-ray obtained, personally reviewed by me, demonstrates no acute cardiac or pulmonary process.  Patient's heart score is 5.  Discussion was had with the patient regarding recommended admission for provocative testing.  The patient expressed that she would prefer to have outpatient testing done.  I explained the risks of delaying testing versus the benefits of outpatient care.  She prefers to proceed with outpatient testing and will follow up with her PCP on Monday.  She understands to return to the emergency department should any new symptoms develop.  Patient is discharged home with strict return precautions, follow-up instructions, and educational materials.  Final Clinical Impressions(s) / ED Diagnoses   Final diagnoses:  Chest pain, unspecified type    ED Discharge Orders    None       Garey Ham, MD 06/23/17 1610    Tegeler, Canary Brim, MD 06/23/17 1140

## 2017-06-23 ENCOUNTER — Encounter: Payer: Self-pay | Admitting: Obstetrics & Gynecology

## 2017-06-23 NOTE — Patient Instructions (Signed)
1. Encounter for gynecological examination with abnormal finding Gynecologic exam status post total abdominal hysterectomy with left salpingo-oophorectomy.  Pap reflex done on the vaginal vault.  Breast exam normal.  Will schedule screening mammogram when due.  Health labs with family physician. - Pap IG, CT/NG w/ reflex HPV when ASC-U  2. S/P TAH (total abdominal hysterectomy)  3. Screen for STD (sexually transmitted disease) Condom use recommended. - RPR - Hepatitis B Surface AntiGEN - Hepatitis C Antibody - Pap IG, CT/NG w/ reflex HPV when ASC-U  Janice Huang, it was a pleasure meeting you today!  I will inform you of your results as soon as they are available.  Health Maintenance, Female Adopting a healthy lifestyle and getting preventive care can go a long way to promote health and wellness. Talk with your health care provider about what schedule of regular examinations is right for you. This is a good chance for you to check in with your provider about disease prevention and staying healthy. In between checkups, there are plenty of things you can do on your own. Experts have done a lot of research about which lifestyle changes and preventive measures are most likely to keep you healthy. Ask your health care provider for more information. Weight and diet Eat a healthy diet  Be sure to include plenty of vegetables, fruits, low-fat dairy products, and lean protein.  Do not eat a lot of foods high in solid fats, added sugars, or salt.  Get regular exercise. This is one of the most important things you can do for your health. ? Most adults should exercise for at least 150 minutes each week. The exercise should increase your heart rate and make you sweat (moderate-intensity exercise). ? Most adults should also do strengthening exercises at least twice a week. This is in addition to the moderate-intensity exercise.  Maintain a healthy weight  Body mass index (BMI) is a measurement that can  be used to identify possible weight problems. It estimates body fat based on height and weight. Your health care provider can help determine your BMI and help you achieve or maintain a healthy weight.  For females 57 years of age and older: ? A BMI below 18.5 is considered underweight. ? A BMI of 18.5 to 24.9 is normal. ? A BMI of 25 to 29.9 is considered overweight. ? A BMI of 30 and above is considered obese.  Watch levels of cholesterol and blood lipids  You should start having your blood tested for lipids and cholesterol at 47 years of age, then have this test every 5 years.  You may need to have your cholesterol levels checked more often if: ? Your lipid or cholesterol levels are high. ? You are older than 47 years of age. ? You are at high risk for heart disease.  Cancer screening Lung Cancer  Lung cancer screening is recommended for adults 58-54 years old who are at high risk for lung cancer because of a history of smoking.  A yearly low-dose CT scan of the lungs is recommended for people who: ? Currently smoke. ? Have quit within the past 15 years. ? Have at least a 30-pack-year history of smoking. A pack year is smoking an average of one pack of cigarettes a day for 1 year.  Yearly screening should continue until it has been 15 years since you quit.  Yearly screening should stop if you develop a health problem that would prevent you from having lung cancer treatment.  Breast Cancer  Practice breast self-awareness. This means understanding how your breasts normally appear and feel.  It also means doing regular breast self-exams. Let your health care provider know about any changes, no matter how small.  If you are in your 20s or 30s, you should have a clinical breast exam (CBE) by a health care provider every 1-3 years as part of a regular health exam.  If you are 20 or older, have a CBE every year. Also consider having a breast X-ray (mammogram) every year.  If you  have a family history of breast cancer, talk to your health care provider about genetic screening.  If you are at high risk for breast cancer, talk to your health care provider about having an MRI and a mammogram every year.  Breast cancer gene (BRCA) assessment is recommended for women who have family members with BRCA-related cancers. BRCA-related cancers include: ? Breast. ? Ovarian. ? Tubal. ? Peritoneal cancers.  Results of the assessment will determine the need for genetic counseling and BRCA1 and BRCA2 testing.  Cervical Cancer Your health care provider may recommend that you be screened regularly for cancer of the pelvic organs (ovaries, uterus, and vagina). This screening involves a pelvic examination, including checking for microscopic changes to the surface of your cervix (Pap test). You may be encouraged to have this screening done every 3 years, beginning at age 64.  For women ages 49-65, health care providers may recommend pelvic exams and Pap testing every 3 years, or they may recommend the Pap and pelvic exam, combined with testing for human papilloma virus (HPV), every 5 years. Some types of HPV increase your risk of cervical cancer. Testing for HPV may also be done on women of any age with unclear Pap test results.  Other health care providers may not recommend any screening for nonpregnant women who are considered low risk for pelvic cancer and who do not have symptoms. Ask your health care provider if a screening pelvic exam is right for you.  If you have had past treatment for cervical cancer or a condition that could lead to cancer, you need Pap tests and screening for cancer for at least 20 years after your treatment. If Pap tests have been discontinued, your risk factors (such as having a new sexual partner) need to be reassessed to determine if screening should resume. Some women have medical problems that increase the chance of getting cervical cancer. In these cases,  your health care provider may recommend more frequent screening and Pap tests.  Colorectal Cancer  This type of cancer can be detected and often prevented.  Routine colorectal cancer screening usually begins at 47 years of age and continues through 47 years of age.  Your health care provider may recommend screening at an earlier age if you have risk factors for colon cancer.  Your health care provider may also recommend using home test kits to check for hidden blood in the stool.  A small camera at the end of a tube can be used to examine your colon directly (sigmoidoscopy or colonoscopy). This is done to check for the earliest forms of colorectal cancer.  Routine screening usually begins at age 18.  Direct examination of the colon should be repeated every 5-10 years through 47 years of age. However, you may need to be screened more often if early forms of precancerous polyps or small growths are found.  Skin Cancer  Check your skin from head to toe regularly.  Tell your health care  provider about any new moles or changes in moles, especially if there is a change in a mole's shape or color.  Also tell your health care provider if you have a mole that is larger than the size of a pencil eraser.  Always use sunscreen. Apply sunscreen liberally and repeatedly throughout the day.  Protect yourself by wearing long sleeves, pants, a wide-brimmed hat, and sunglasses whenever you are outside.  Heart disease, diabetes, and high blood pressure  High blood pressure causes heart disease and increases the risk of stroke. High blood pressure is more likely to develop in: ? People who have blood pressure in the high end of the normal range (130-139/85-89 mm Hg). ? People who are overweight or obese. ? People who are African American.  If you are 10-46 years of age, have your blood pressure checked every 3-5 years. If you are 45 years of age or older, have your blood pressure checked every year.  You should have your blood pressure measured twice-once when you are at a hospital or clinic, and once when you are not at a hospital or clinic. Record the average of the two measurements. To check your blood pressure when you are not at a hospital or clinic, you can use: ? An automated blood pressure machine at a pharmacy. ? A home blood pressure monitor.  If you are between 50 years and 82 years old, ask your health care provider if you should take aspirin to prevent strokes.  Have regular diabetes screenings. This involves taking a blood sample to check your fasting blood sugar level. ? If you are at a normal weight and have a low risk for diabetes, have this test once every three years after 47 years of age. ? If you are overweight and have a high risk for diabetes, consider being tested at a younger age or more often. Preventing infection Hepatitis B  If you have a higher risk for hepatitis B, you should be screened for this virus. You are considered at high risk for hepatitis B if: ? You were born in a country where hepatitis B is common. Ask your health care provider which countries are considered high risk. ? Your parents were born in a high-risk country, and you have not been immunized against hepatitis B (hepatitis B vaccine). ? You have HIV or AIDS. ? You use needles to inject street drugs. ? You live with someone who has hepatitis B. ? You have had sex with someone who has hepatitis B. ? You get hemodialysis treatment. ? You take certain medicines for conditions, including cancer, organ transplantation, and autoimmune conditions.  Hepatitis C  Blood testing is recommended for: ? Everyone born from [birth year] through 1965. ? Anyone with known risk factors for hepatitis C.  Sexually transmitted infections (STIs)  You should be screened for sexually transmitted infections (STIs) including gonorrhea and chlamydia if: ? You are sexually active and are younger than 47 years of  age. ? You are older than 47 years of age and your health care provider tells you that you are at risk for this type of infection. ? Your sexual activity has changed since you were last screened and you are at an increased risk for chlamydia or gonorrhea. Ask your health care provider if you are at risk.  If you do not have HIV, but are at risk, it may be recommended that you take a prescription medicine daily to prevent HIV infection. This is called pre-exposure prophylaxis (PrEP). You  are considered at risk if: ? You are sexually active and do not regularly use condoms or know the HIV status of your partner(s). ? You take drugs by injection. ? You are sexually active with a partner who has HIV.  Talk with your health care provider about whether you are at high risk of being infected with HIV. If you choose to begin PrEP, you should first be tested for HIV. You should then be tested every 3 months for as long as you are taking PrEP. Pregnancy  If you are premenopausal and you may become pregnant, ask your health care provider about preconception counseling.  If you may become pregnant, take 400 to 800 micrograms (mcg) of folic acid every day.  If you want to prevent pregnancy, talk to your health care provider about birth control (contraception). Osteoporosis and menopause  Osteoporosis is a disease in which the bones lose minerals and strength with aging. This can result in serious bone fractures. Your risk for osteoporosis can be identified using a bone density scan.  If you are 54 years of age or older, or if you are at risk for osteoporosis and fractures, ask your health care provider if you should be screened.  Ask your health care provider whether you should take a calcium or vitamin D supplement to lower your risk for osteoporosis.  Menopause may have certain physical symptoms and risks.  Hormone replacement therapy may reduce some of these symptoms and risks. Talk to your health  care provider about whether hormone replacement therapy is right for you. Follow these instructions at home:  Schedule regular health, dental, and eye exams.  Stay current with your immunizations.  Do not use any tobacco products including cigarettes, chewing tobacco, or electronic cigarettes.  If you are pregnant, do not drink alcohol.  If you are breastfeeding, limit how much and how often you drink alcohol.  Limit alcohol intake to no more than 1 drink per day for nonpregnant women. One drink equals 12 ounces of beer, 5 ounces of wine, or 1 ounces of hard liquor.  Do not use street drugs.  Do not share needles.  Ask your health care provider for help if you need support or information about quitting drugs.  Tell your health care provider if you often feel depressed.  Tell your health care provider if you have ever been abused or do not feel safe at home. This information is not intended to replace advice given to you by your health care provider. Make sure you discuss any questions you have with your health care provider. Document Released: 11/21/2010 Document Revised: 10/14/2015 Document Reviewed: 02/09/2015 Elsevier Interactive Patient Education  [employer].

## 2017-07-02 ENCOUNTER — Ambulatory Visit: Payer: Managed Care, Other (non HMO) | Admitting: Internal Medicine

## 2017-07-02 ENCOUNTER — Encounter: Payer: Self-pay | Admitting: Internal Medicine

## 2017-07-02 VITALS — BP 124/80 | HR 57 | Temp 98.3°F | Resp 16 | Ht 67.0 in | Wt 293.0 lb

## 2017-07-02 DIAGNOSIS — R9431 Abnormal electrocardiogram [ECG] [EKG]: Secondary | ICD-10-CM | POA: Insufficient documentation

## 2017-07-02 DIAGNOSIS — I10 Essential (primary) hypertension: Secondary | ICD-10-CM

## 2017-07-02 NOTE — Progress Notes (Signed)
Subjective:  Patient ID: Janice Huang, female    DOB: [birth year]  Age: 47 y.o. MRN: [id number]  CC: Hypertension   HPI Janice RamusJacquetta B Kibby presents for f/up -she was recently seen in the ED for chest pain that C is associated with anxiety.  She tells me she was having an argument with a teenager when she felt like an elephant was sitting on her chest.  She went to the ED and was found to have a mildly abnormal EKG.  Her troponin was negative.  She was not willing to be admitted.  She has had no recurrences of chest pain and she denies DOE, SOB, palpitations, edema, or fatigue.  Outpatient Medications Prior to Visit  Medication Sig Dispense Refill  . acetaminophen (TYLENOL) 500 MG tablet Take 1,000 mg by mouth every 6 (six) hours as needed for moderate pain.    Marland Kitchen. allopurinol (ZYLOPRIM) 300 MG tablet TAKE 1 TABLET BY MOUTH DAILY 90 tablet 1  . Cetirizine HCl (ZYRTEC ALLERGY) 10 MG CAPS Take 1 capsule (10 mg total) by mouth every morning. 90 capsule 1  . COLCRYS 0.6 MG tablet TAKE 1 TABLET BY MOUTH TWICE A DAY 270 tablet 0  . lisinopril (PRINIVIL,ZESTRIL) 20 MG tablet TAKE 1 TABLET BY MOUTH DAILY 90 tablet 1  . Pitavastatin Calcium (LIVALO) 4 MG TABS Take 1 tablet (4 mg total) by mouth daily. 90 tablet 1   No facility-administered medications prior to visit.     ROS Review of Systems  Constitutional: Negative for diaphoresis, fatigue and unexpected weight change.  HENT: Negative.   Eyes: Negative for visual disturbance.  Respiratory: Negative for cough, chest tightness, shortness of breath and wheezing.   Cardiovascular: Negative for chest pain, palpitations and leg swelling.  Gastrointestinal: Negative for abdominal pain, diarrhea, nausea and vomiting.  Endocrine: Negative.   Genitourinary: Negative.  Negative for difficulty urinating.  Musculoskeletal: Negative.  Negative for neck pain.  Skin: Negative.   Neurological: Negative.  Negative for dizziness, weakness,  light-headedness and headaches.  Hematological: Negative for adenopathy. Does not bruise/bleed easily.  Psychiatric/Behavioral: Negative for sleep disturbance and suicidal ideas. The patient is nervous/anxious.     Objective:  BP 124/80 (BP Location: Left Arm, Patient Position: Sitting, Cuff Size: Large)   Pulse (!) 57   Temp 98.3 F (36.8 C) (Oral)   Resp 16   Ht 5\' 7"  (1.702 m)   Wt 293 lb (132.9 kg)   SpO2 100%   BMI 45.89 kg/m   BP Readings from Last 3 Encounters:  07/02/17 124/80  06/22/17 122/69  06/20/17 132/88    Wt Readings from Last 3 Encounters:  07/02/17 293 lb (132.9 kg)  06/22/17 288 lb (130.6 kg)  06/20/17 288 lb (130.6 kg)    Physical Exam  Constitutional: She is oriented to person, place, and time. No distress.  HENT:  Mouth/Throat: Oropharynx is clear and moist. No oropharyngeal exudate.  Eyes: Conjunctivae are normal. Left eye exhibits no discharge. No scleral icterus.  Neck: Normal range of motion. Neck supple. No JVD present. No thyromegaly present.  Cardiovascular: Normal rate, regular rhythm and normal heart sounds. Exam reveals no gallop.  No murmur heard. Pulmonary/Chest: Effort normal and breath sounds normal. No respiratory distress. She has no wheezes. She has no rales.  Abdominal: Soft. Bowel sounds are normal. She exhibits no distension and no mass. There is no tenderness.  Musculoskeletal: Normal range of motion. She exhibits no edema, tenderness or deformity.  Lymphadenopathy:    She  has no cervical adenopathy.  Neurological: She is alert and oriented to person, place, and time.  Skin: Skin is warm and dry. No rash noted. She is not diaphoretic. No erythema. No pallor.  Psychiatric: She has a normal mood and affect. Her behavior is normal. Judgment and thought content normal.  Vitals reviewed.   Lab Results  Component Value Date   WBC 7.5 06/22/2017   HGB 12.4 06/22/2017   HCT 37.6 06/22/2017   PLT 278 06/22/2017   GLUCOSE 91  06/22/2017   CHOL 302 (H) 04/17/2017   TRIG 122.0 04/17/2017   HDL 52.90 04/17/2017   LDLDIRECT 217.2 05/23/2013   LDLCALC 225 (H) 04/17/2017   ALT 15 04/17/2017   AST 19 04/17/2017   NA 137 06/22/2017   K 3.9 06/22/2017   CL 104 06/22/2017   CREATININE 0.78 06/22/2017   BUN 13 06/22/2017   CO2 22 06/22/2017   TSH 0.85 10/19/2016   HGBA1C 5.9 04/17/2017   MICROALBUR 2.8 (H) 04/14/2015    Dg Chest 2 View  Result Date: 06/22/2017 CLINICAL DATA:  Pt is coming from home with complaints of chest tightness and SOB that started today at 1720. Pt ha hx of HTN EXAM: CHEST  2 VIEW COMPARISON:  07/17/2013 FINDINGS: The heart size and mediastinal contours are within normal limits. Both lungs are clear. The visualized skeletal structures are unremarkable. IMPRESSION: No active cardiopulmonary disease. Electronically Signed   By: Norva Pavlov M.D.   On: 06/22/2017 19:40    Assessment & Plan:   Ailine was seen today for hypertension.  Diagnoses and all orders for this visit:  Essential hypertension, benign- Her blood pressure is adequately well controlled.  Abnormal electrocardiogram (ECG) (EKG)- She is convinced the chest pain was related to anxiety.  She was not willing to do another EKG today.  She does not want to be referred for a myocardial perfusion imaging either.  She will let me know if she has any recurrent symptoms.  I did encourage her to continue risk factor modifications with a baby aspirin a day and the statin therapy.   I am having Fernande B. Hardacre maintain her acetaminophen, Cetirizine HCl, allopurinol, COLCRYS, Pitavastatin Calcium, and lisinopril.  No orders of the defined types were placed in this encounter.    Follow-up: No Follow-up on file.  Sanda Linger, MD

## 2017-07-03 ENCOUNTER — Encounter: Payer: Self-pay | Admitting: Internal Medicine

## 2017-07-03 NOTE — Patient Instructions (Signed)

## 2017-07-10 ENCOUNTER — Other Ambulatory Visit: Payer: Self-pay | Admitting: Internal Medicine

## 2017-07-10 DIAGNOSIS — M1000 Idiopathic gout, unspecified site: Secondary | ICD-10-CM

## 2017-08-15 ENCOUNTER — Ambulatory Visit: Payer: Managed Care, Other (non HMO) | Admitting: Internal Medicine

## 2017-08-15 ENCOUNTER — Encounter: Payer: Self-pay | Admitting: Internal Medicine

## 2017-08-15 ENCOUNTER — Other Ambulatory Visit (INDEPENDENT_AMBULATORY_CARE_PROVIDER_SITE_OTHER): Payer: Managed Care, Other (non HMO)

## 2017-08-15 VITALS — BP 140/70 | HR 67 | Temp 98.8°F | Resp 16 | Ht 67.0 in | Wt 287.2 lb

## 2017-08-15 DIAGNOSIS — I10 Essential (primary) hypertension: Secondary | ICD-10-CM | POA: Diagnosis not present

## 2017-08-15 DIAGNOSIS — E785 Hyperlipidemia, unspecified: Secondary | ICD-10-CM

## 2017-08-15 LAB — LIPID PANEL
CHOL/HDL RATIO: 5
Cholesterol: 257 mg/dL — ABNORMAL HIGH (ref 0–200)
HDL: 51.1 mg/dL (ref >39.00)
LDL CALC: 187 mg/dL — AB (ref 0–99)
NONHDL: 206.08
TRIGLYCERIDES: 94 mg/dL (ref 0.0–149.0)
VLDL: 18.8 mg/dL (ref 0.0–40.0)

## 2017-08-15 NOTE — Progress Notes (Signed)
Subjective:  Patient ID: Janice Huang, female    DOB: March 13, 1971  Age: 47 y.o. MRN: [id number]  CC: Hypertension and Hyperlipidemia   HPI Janice Huang presents for f/up - she feels well and offers no complaints.  Her compliance with the statin has not been very good.  She thinks she takes it about half the time.  She tells me her blood pressure has been well controlled.  She denies any recent episodes of headache, blurred vision, chest pain, or shortness of breath.  Outpatient Medications Prior to Visit  Medication Sig Dispense Refill  . allopurinol (ZYLOPRIM) 300 MG tablet TAKE 1 TABLET BY MOUTH DAILY 90 tablet 0  . COLCRYS 0.6 MG tablet TAKE 1 TABLET BY MOUTH TWICE A DAY 270 tablet 0  . lisinopril (PRINIVIL,ZESTRIL) 20 MG tablet TAKE 1 TABLET BY MOUTH DAILY 90 tablet 1  . acetaminophen (TYLENOL) 500 MG tablet Take 1,000 mg by mouth every 6 (six) hours as needed for moderate pain.    . Cetirizine HCl (ZYRTEC ALLERGY) 10 MG CAPS Take 1 capsule (10 mg total) by mouth every morning. 90 capsule 1  . Pitavastatin Calcium (LIVALO) 4 MG TABS Take 1 tablet (4 mg total) by mouth daily. (Patient not taking: Reported on 08/15/2017) 90 tablet 1   No facility-administered medications prior to visit.     ROS Review of Systems  Constitutional: Negative.  Negative for appetite change, diaphoresis, fatigue and unexpected weight change.  HENT: Negative.   Eyes: Negative for visual disturbance.  Respiratory: Negative for cough, chest tightness, shortness of breath and wheezing.   Cardiovascular: Negative for chest pain, palpitations and leg swelling.  Gastrointestinal: Negative.  Negative for abdominal pain, constipation, diarrhea, nausea and vomiting.  Endocrine: Negative.   Genitourinary: Negative.  Negative for difficulty urinating.  Musculoskeletal: Negative.  Negative for arthralgias, back pain, myalgias and neck pain.  Skin: Negative for color change, pallor and rash.    Allergic/Immunologic: Negative.   Neurological: Negative.  Negative for dizziness, weakness, light-headedness and headaches.  Hematological: Negative for adenopathy. Does not bruise/bleed easily.  Psychiatric/Behavioral: Negative.     Objective:  BP 140/70 (BP Location: Left Arm, Patient Position: Sitting, Cuff Size: Large)   Pulse 67   Temp 98.8 F (37.1 C) (Oral)   Resp 16   Ht 5\' 7"  (1.702 m)   Wt 287 lb 4 oz (130.3 kg)   SpO2 97%   BMI 44.99 kg/m   BP Readings from Last 3 Encounters:  08/15/17 140/70  07/02/17 124/80  06/22/17 122/69    Wt Readings from Last 3 Encounters:  08/15/17 287 lb 4 oz (130.3 kg)  07/02/17 293 lb (132.9 kg)  06/22/17 288 lb (130.6 kg)    Physical Exam  Constitutional: She is oriented to person, place, and time. No distress.  HENT:  Mouth/Throat: Oropharynx is clear and moist. No oropharyngeal exudate.  Eyes: Conjunctivae are normal. Left eye exhibits no discharge. No scleral icterus.  Neck: Normal range of motion. Neck supple. No JVD present. No thyromegaly present.  Cardiovascular: Normal rate and regular rhythm. Exam reveals no gallop and no friction rub.  No murmur heard. Pulmonary/Chest: Effort normal and breath sounds normal. No respiratory distress. She has no wheezes. She has no rales.  Abdominal: Soft. Bowel sounds are normal. She exhibits no distension and no mass. There is no tenderness. There is no guarding.  Musculoskeletal: Normal range of motion. She exhibits no edema, tenderness or deformity.  Lymphadenopathy:    She has  no cervical adenopathy.  Neurological: She is alert and oriented to person, place, and time.  Skin: Skin is warm and dry. No rash noted. She is not diaphoretic. No erythema. No pallor.  Vitals reviewed.   Lab Results  Component Value Date   WBC 7.5 06/22/2017   HGB 12.4 06/22/2017   HCT 37.6 06/22/2017   PLT 278 06/22/2017   GLUCOSE 91 06/22/2017   CHOL 257 (H) 08/15/2017   TRIG 94.0 08/15/2017    HDL 51.10 08/15/2017   LDLDIRECT 217.2 05/23/2013   LDLCALC 187 (H) 08/15/2017   ALT 15 04/17/2017   AST 19 04/17/2017   NA 137 06/22/2017   K 3.9 06/22/2017   CL 104 06/22/2017   CREATININE 0.78 06/22/2017   BUN 13 06/22/2017   CO2 22 06/22/2017   TSH 0.85 10/19/2016   HGBA1C 5.9 04/17/2017   MICROALBUR 2.8 (H) 04/14/2015    Dg Chest 2 View  Result Date: 06/22/2017 CLINICAL DATA:  Pt is coming from home with complaints of chest tightness and SOB that started today at 1720. Pt ha hx of HTN EXAM: CHEST  2 VIEW COMPARISON:  07/17/2013 FINDINGS: The heart size and mediastinal contours are within normal limits. Both lungs are clear. The visualized skeletal structures are unremarkable. IMPRESSION: No active cardiopulmonary disease. Electronically Signed   By: Norva Pavlov M.D.   On: 06/22/2017 19:40    Assessment & Plan:   Lexi was seen today for hypertension and hyperlipidemia.  Diagnoses and all orders for this visit:  Essential hypertension, benign- Her blood pressure is adequately well controlled.  Hyperlipidemia with target LDL less than 130- She has not achieved her LDL goal.  I have asked her to be more compliant with the statin therapy.  Will recheck her lipid panel in about 6 months. -     Lipid panel; Future -     Pitavastatin Calcium (LIVALO) 4 MG TABS; Take 1 tablet (4 mg total) by mouth daily.   I have discontinued Leora B. Garvey's acetaminophen and Cetirizine HCl. I am also having her maintain her COLCRYS, lisinopril, allopurinol, and Pitavastatin Calcium.  Meds ordered this encounter  Medications  . Pitavastatin Calcium (LIVALO) 4 MG TABS    Sig: Take 1 tablet (4 mg total) by mouth daily.    Dispense:  90 tablet    Refill:  1     Follow-up: Return in about 6 months (around 02/15/2018).  Sanda Linger, MD

## 2017-08-15 NOTE — Patient Instructions (Signed)

## 2017-08-16 MED ORDER — PITAVASTATIN CALCIUM 4 MG PO TABS: 1.0000 | ORAL_TABLET | Freq: Every day | ORAL | 1 refills | Status: DC

## 2017-08-20 ENCOUNTER — Telehealth: Payer: Self-pay | Admitting: Internal Medicine

## 2017-08-20 NOTE — Telephone Encounter (Signed)
Patient called back about her labs. She has been informed of the notes.   She states she does take the cholesterol medication but not like she is suppose too. She is will start taking the medication right.

## 2017-12-03 ENCOUNTER — Other Ambulatory Visit: Payer: Self-pay | Admitting: Internal Medicine

## 2017-12-03 DIAGNOSIS — Z1231 Encounter for screening mammogram for malignant neoplasm of breast: Secondary | ICD-10-CM

## 2017-12-18 ENCOUNTER — Other Ambulatory Visit: Payer: Self-pay | Admitting: Internal Medicine

## 2017-12-18 DIAGNOSIS — I10 Essential (primary) hypertension: Secondary | ICD-10-CM

## 2017-12-18 NOTE — Telephone Encounter (Signed)
Pt is due for a follow up for HTN in September. Can you call her?

## 2017-12-19 NOTE — Telephone Encounter (Signed)
Left message for patient to call back to schedule.  °

## 2017-12-25 ENCOUNTER — Ambulatory Visit
Admission: RE | Admit: 2017-12-25 | Discharge: 2017-12-25 | Disposition: A | Discharge status: Home / Self Care | Payer: Managed Care, Other (non HMO) | Source: Ambulatory Visit | Attending: Internal Medicine | Admitting: Internal Medicine

## 2017-12-25 DIAGNOSIS — Z1231 Encounter for screening mammogram for malignant neoplasm of breast: Secondary | ICD-10-CM

## 2017-12-25 LAB — HM MAMMOGRAPHY

## 2017-12-26 ENCOUNTER — Other Ambulatory Visit: Payer: Self-pay | Admitting: Internal Medicine

## 2017-12-26 DIAGNOSIS — I10 Essential (primary) hypertension: Secondary | ICD-10-CM

## 2018-01-23 ENCOUNTER — Other Ambulatory Visit: Payer: Self-pay | Admitting: Internal Medicine

## 2018-01-23 ENCOUNTER — Encounter: Payer: Self-pay | Admitting: Internal Medicine

## 2018-01-23 ENCOUNTER — Ambulatory Visit: Payer: Managed Care, Other (non HMO) | Admitting: Internal Medicine

## 2018-01-23 VITALS — BP 122/62 | HR 69 | Temp 98.5°F | Resp 16 | Ht 67.0 in | Wt 286.5 lb

## 2018-01-23 DIAGNOSIS — Z23 Encounter for immunization: Secondary | ICD-10-CM

## 2018-01-23 DIAGNOSIS — M1000 Idiopathic gout, unspecified site: Secondary | ICD-10-CM

## 2018-01-23 DIAGNOSIS — Z111 Encounter for screening for respiratory tuberculosis: Secondary | ICD-10-CM | POA: Diagnosis not present

## 2018-01-23 DIAGNOSIS — I10 Essential (primary) hypertension: Secondary | ICD-10-CM

## 2018-01-23 DIAGNOSIS — E785 Hyperlipidemia, unspecified: Secondary | ICD-10-CM | POA: Diagnosis not present

## 2018-01-23 MED ORDER — ATORVASTATIN CALCIUM 40 MG PO TABS: 40.0000 mg | ORAL_TABLET | Freq: Every day | ORAL | 1 refills | Status: DC

## 2018-01-23 NOTE — Patient Instructions (Signed)

## 2018-01-23 NOTE — Progress Notes (Signed)
Subjective:  Patient ID: Janice Huang, female    DOB: October 29, 1970  Age: 47 y.o. MRN: [id number]  CC: Hypertension and Hyperlipidemia   HPI Janice Huang presents for f/up - She feels well and offers no complaints.  She tells me her blood pressure has been well controlled.  She is not taking the statin because it was too expensive.  She wants to change to a generic.  Outpatient Medications Prior to Visit  Medication Sig Dispense Refill  . allopurinol (ZYLOPRIM) 300 MG tablet TAKE 1 TABLET BY MOUTH DAILY 90 tablet 1  . COLCRYS 0.6 MG tablet TAKE 1 TABLET BY MOUTH TWICE A DAY 270 tablet 0  . lisinopril (PRINIVIL,ZESTRIL) 20 MG tablet TAKE 1 TABLET BY MOUTH DAILY 90 tablet 1  . Pitavastatin Calcium (LIVALO) 4 MG TABS Take 1 tablet (4 mg total) by mouth daily. 90 tablet 1   No facility-administered medications prior to visit.     ROS Review of Systems  Constitutional: Negative for chills, diaphoresis, fatigue and unexpected weight change.  HENT: Negative.   Eyes: Negative for visual disturbance.  Respiratory: Negative for cough, chest tightness, shortness of breath and wheezing.   Cardiovascular: Negative for chest pain, palpitations and leg swelling.  Gastrointestinal: Negative.  Negative for abdominal pain, constipation, diarrhea, nausea and vomiting.  Endocrine: Negative.   Genitourinary: Negative.  Negative for difficulty urinating and dysuria.  Musculoskeletal: Negative.  Negative for arthralgias, back pain and neck pain.  Skin: Negative.   Neurological: Negative.  Negative for dizziness, weakness and light-headedness.  Hematological: Negative for adenopathy. Does not bruise/bleed easily.  Psychiatric/Behavioral: Negative.     Objective:  BP 122/62 (BP Location: Left Arm, Patient Position: Sitting, Cuff Size: Large)   Pulse 69   Temp 98.5 F (36.9 C) (Oral)   Resp 16   Ht 5\' 7"  (1.702 m)   Wt 286 lb 8 oz (130 kg)   SpO2 98%   BMI 44.87 kg/m   BP  Readings from Last 3 Encounters:  01/23/18 122/62  08/15/17 140/70  07/02/17 124/80    Wt Readings from Last 3 Encounters:  01/23/18 286 lb 8 oz (130 kg)  08/15/17 287 lb 4 oz (130.3 kg)  07/02/17 293 lb (132.9 kg)    Physical Exam  Constitutional: She is oriented to person, place, and time. No distress.  HENT:  Mouth/Throat: Oropharynx is clear and moist. No oropharyngeal exudate.  Eyes: Conjunctivae are normal. No scleral icterus.  Neck: Normal range of motion. Neck supple. No JVD present. No thyromegaly present.  Cardiovascular: Normal rate, regular rhythm and normal heart sounds. Exam reveals no gallop.  No murmur heard. Pulmonary/Chest: Effort normal and breath sounds normal. No respiratory distress. She has no wheezes. She has no rales.  Abdominal: Soft. Normal appearance and bowel sounds are normal. There is no hepatosplenomegaly. There is no tenderness.  Musculoskeletal: Normal range of motion. She exhibits no edema, tenderness or deformity.  Lymphadenopathy:    She has no cervical adenopathy.  Neurological: She is alert and oriented to person, place, and time.  Skin: Skin is warm and dry. She is not diaphoretic. No pallor.  Psychiatric: She has a normal mood and affect. Her behavior is normal. Judgment and thought content normal.  Vitals reviewed.   Lab Results  Component Value Date   WBC 7.5 06/22/2017   HGB 12.4 06/22/2017   HCT 37.6 06/22/2017   PLT 278 06/22/2017   GLUCOSE 91 06/22/2017   CHOL 257 (H) 08/15/2017  TRIG 94.0 08/15/2017   HDL 51.10 08/15/2017   LDLDIRECT 217.2 05/23/2013   LDLCALC 187 (H) 08/15/2017   ALT 15 04/17/2017   AST 19 04/17/2017   NA 137 06/22/2017   K 3.9 06/22/2017   CL 104 06/22/2017   CREATININE 0.78 06/22/2017   BUN 13 06/22/2017   CO2 22 06/22/2017   TSH 0.85 10/19/2016   HGBA1C 5.9 04/17/2017   MICROALBUR 2.8 (H) 04/14/2015    Mm Digital Screening Bilateral  Result Date: 12/25/2017 CLINICAL DATA:  Screening. EXAM:  DIGITAL SCREENING BILATERAL MAMMOGRAM WITH CAD COMPARISON:  Previous exam(s). ACR Breast Density Category b: There are scattered areas of fibroglandular density. FINDINGS: There are no findings suspicious for malignancy. Images were processed with CAD. IMPRESSION: No mammographic evidence of malignancy. A result letter of this screening mammogram will be mailed directly to the patient. RECOMMENDATION: Screening mammogram in one year. (Code:SM-B-01Y) BI-RADS CATEGORY  1: Negative. Electronically Signed   By: Frederico Hamman M.D.   On: 12/25/2017 15:43    Assessment & Plan:   Janice Huang was seen today for hypertension and hyperlipidemia.  Diagnoses and all orders for this visit:  Hyperlipidemia with target LDL less than 130- Will change to atorvastatin.  I will screen her for hypothyroidism. -     atorvastatin (LIPITOR) 40 MG tablet; Take 1 tablet (40 mg total) by mouth daily. -     Comprehensive metabolic panel; Future -     TSH; Future  Essential hypertension, benign- Her blood pressure is well controlled.  I will monitor her lites and renal function. -     Comprehensive metabolic panel; Future -     TSH; Future  Need for influenza vaccination -     Flu Vaccine QUAD 36+ mos IM  Need for Tdap vaccination -     Tdap vaccine greater than or equal to 7yo IM  Screening-pulmonary TB -     PPD   I have discontinued Janice Huang's Pitavastatin Calcium. I am also having her start on atorvastatin. Additionally, I am having her maintain her COLCRYS, lisinopril, and allopurinol.  Meds ordered this encounter  Medications  . atorvastatin (LIPITOR) 40 MG tablet    Sig: Take 1 tablet (40 mg total) by mouth daily.    Dispense:  90 tablet    Refill:  1     Follow-up: Return in about 6 months (around 07/24/2018).  Sanda Linger, MD

## 2018-01-25 ENCOUNTER — Encounter: Payer: Self-pay | Admitting: Emergency Medicine

## 2018-01-25 LAB — TB SKIN TEST
INDURATION: 0 mm
TB SKIN TEST: NEGATIVE

## 2018-04-22 ENCOUNTER — Other Ambulatory Visit: Payer: Self-pay | Admitting: Internal Medicine

## 2018-04-22 DIAGNOSIS — I10 Essential (primary) hypertension: Secondary | ICD-10-CM

## 2018-07-24 ENCOUNTER — Other Ambulatory Visit (INDEPENDENT_AMBULATORY_CARE_PROVIDER_SITE_OTHER): Payer: Managed Care, Other (non HMO)

## 2018-07-24 ENCOUNTER — Ambulatory Visit: Payer: Managed Care, Other (non HMO) | Admitting: Internal Medicine

## 2018-07-24 ENCOUNTER — Encounter: Payer: Self-pay | Admitting: Internal Medicine

## 2018-07-24 VITALS — BP 126/76 | HR 65 | Temp 98.0°F | Ht 67.0 in | Wt 298.5 lb

## 2018-07-24 DIAGNOSIS — I10 Essential (primary) hypertension: Secondary | ICD-10-CM

## 2018-07-24 DIAGNOSIS — R7303 Prediabetes: Secondary | ICD-10-CM

## 2018-07-24 DIAGNOSIS — E785 Hyperlipidemia, unspecified: Secondary | ICD-10-CM

## 2018-07-24 LAB — COMPREHENSIVE METABOLIC PANEL
ALT: 13 U/L (ref 0–35)
AST: 14 U/L (ref 0–37)
Albumin: 3.8 g/dL (ref 3.5–5.2)
Alkaline Phosphatase: 46 U/L (ref 39–117)
BUN: 9 mg/dL (ref 6–23)
CHLORIDE: 105 meq/L (ref 96–112)
CO2: 30 mEq/L (ref 19–32)
Calcium: 8.5 mg/dL (ref 8.4–10.5)
Creatinine, Ser: 0.77 mg/dL (ref 0.40–1.20)
GFR: 96.85 mL/min (ref >60.00)
GLUCOSE: 88 mg/dL (ref 70–99)
POTASSIUM: 4.5 meq/L (ref 3.5–5.1)
SODIUM: 140 meq/L (ref 135–145)
Total Bilirubin: 0.3 mg/dL (ref 0.2–1.2)
Total Protein: 6.1 g/dL (ref 6.0–8.3)

## 2018-07-24 LAB — POCT GLYCOSYLATED HEMOGLOBIN (HGB A1C)
HBA1C, POC (PREDIABETIC RANGE): 5.6 % — AB (ref 5.7–6.4)
HbA1c POC (<> result, manual entry): 5.6 % (ref 4.0–5.6)

## 2018-07-24 LAB — BASIC METABOLIC PANEL
BUN: 9 mg/dL (ref 6–23)
CALCIUM: 8.5 mg/dL (ref 8.4–10.5)
CO2: 30 mEq/L (ref 19–32)
CREATININE: 0.77 mg/dL (ref 0.40–1.20)
Chloride: 105 mEq/L (ref 96–112)
GFR: 96.85 mL/min (ref >60.00)
Glucose, Bld: 88 mg/dL (ref 70–99)
Potassium: 4.5 mEq/L (ref 3.5–5.1)
Sodium: 140 mEq/L (ref 135–145)

## 2018-07-24 LAB — TSH: TSH: 1.51 u[IU]/mL (ref 0.35–4.50)

## 2018-07-24 NOTE — Progress Notes (Signed)
Subjective:  Patient ID: Janice Huang, female    DOB: [birth year]  Age: 48 y.o. MRN: [id number]  CC: Hypertension   HPI Janice Huang presents for f/up - She tells me her blood pressure has been well controlled but she complains of weight gain.  Outpatient Medications Prior to Visit  Medication Sig Dispense Refill  . allopurinol (ZYLOPRIM) 300 MG tablet TAKE 1 TABLET BY MOUTH DAILY 90 tablet 1  . atorvastatin (LIPITOR) 40 MG tablet Take 1 tablet (40 mg total) by mouth daily. 90 tablet 1  . COLCRYS 0.6 MG tablet TAKE 1 TABLET BY MOUTH TWICE A DAY 270 tablet 0  . lisinopril (PRINIVIL,ZESTRIL) 20 MG tablet TAKE 1 TABLET BY MOUTH DAILY 90 tablet 1   No facility-administered medications prior to visit.     ROS Review of Systems  Constitutional: Positive for unexpected weight change. Negative for appetite change, diaphoresis and fatigue.  HENT: Negative.   Eyes: Negative for visual disturbance.  Respiratory: Negative for cough, chest tightness, shortness of breath and wheezing.   Cardiovascular: Negative for chest pain, palpitations and leg swelling.  Gastrointestinal: Negative for abdominal pain, constipation, diarrhea, nausea and vomiting.  Endocrine: Negative.   Genitourinary: Negative.  Negative for difficulty urinating, dysuria, frequency and hematuria.  Musculoskeletal: Negative.  Negative for arthralgias and myalgias.  Skin: Negative.  Negative for pallor.  Neurological: Negative.  Negative for dizziness, weakness, light-headedness and headaches.  Hematological: Negative for adenopathy. Does not bruise/bleed easily.  Psychiatric/Behavioral: Negative.     Objective:  BP 126/76 (BP Location: Left Arm, Patient Position: Sitting, Cuff Size: Large)   Pulse 65   Temp 98 F (36.7 C) (Oral)   Ht 5\' 7"  (1.702 m)   Wt 298 lb 8 oz (135.4 kg)   SpO2 98%   BMI 46.75 kg/m   BP Readings from Last 3 Encounters:  07/24/18 126/76  01/23/18 122/62  08/15/17 140/70     Wt Readings from Last 3 Encounters:  07/24/18 298 lb 8 oz (135.4 kg)  01/23/18 286 lb 8 oz (130 kg)  08/15/17 287 lb 4 oz (130.3 kg)    Physical Exam Vitals signs reviewed.  Constitutional:      Appearance: She is obese. She is not ill-appearing or diaphoretic.  HENT:     Nose: Nose normal. No congestion or rhinorrhea.     Mouth/Throat:     Mouth: Mucous membranes are moist.     Pharynx: Oropharynx is clear. No oropharyngeal exudate or posterior oropharyngeal erythema.  Eyes:     Conjunctiva/sclera: Conjunctivae normal.  Neck:     Musculoskeletal: Normal range of motion and neck supple. No neck rigidity.  Cardiovascular:     Rate and Rhythm: Normal rate and regular rhythm.     Heart sounds: No murmur. No gallop.   Pulmonary:     Effort: Pulmonary effort is normal.     Breath sounds: No stridor. No wheezing, rhonchi or rales.  Abdominal:     General: Bowel sounds are normal.     Palpations: There is no hepatomegaly, splenomegaly or mass.     Tenderness: There is no abdominal tenderness. There is no guarding.  Musculoskeletal: Normal range of motion.        General: No swelling.     Right lower leg: No edema.     Left lower leg: No edema.  Lymphadenopathy:     Cervical: No cervical adenopathy.  Skin:    General: Skin is warm and dry.  Findings: No rash.  Neurological:     General: No focal deficit present.     Mental Status: She is oriented to person, place, and time. Mental status is at baseline.     Lab Results  Component Value Date   WBC 7.5 06/22/2017   HGB 12.4 06/22/2017   HCT 37.6 06/22/2017   PLT 278 06/22/2017   GLUCOSE 88 07/24/2018   GLUCOSE 88 07/24/2018   CHOL 257 (H) 08/15/2017   TRIG 94.0 08/15/2017   HDL 51.10 08/15/2017   LDLDIRECT 217.2 05/23/2013   LDLCALC 187 (H) 08/15/2017   ALT 13 07/24/2018   AST 14 07/24/2018   NA 140 07/24/2018   NA 140 07/24/2018   K 4.5 07/24/2018   K 4.5 07/24/2018   CL 105 07/24/2018   CL 105  07/24/2018   CREATININE 0.77 07/24/2018   CREATININE 0.77 07/24/2018   BUN 9 07/24/2018   BUN 9 07/24/2018   CO2 30 07/24/2018   CO2 30 07/24/2018   TSH 1.51 07/24/2018   HGBA1C 5.6 07/24/2018   HGBA1C 5.6 (A) 07/24/2018   MICROALBUR 2.8 (H) 04/14/2015    Mm Digital Screening Bilateral  Result Date: 12/25/2017 CLINICAL DATA:  Screening. EXAM: DIGITAL SCREENING BILATERAL MAMMOGRAM WITH CAD COMPARISON:  Previous exam(s). ACR Breast Density Category b: There are scattered areas of fibroglandular density. FINDINGS: There are no findings suspicious for malignancy. Images were processed with CAD. IMPRESSION: No mammographic evidence of malignancy. A result letter of this screening mammogram will be mailed directly to the patient. RECOMMENDATION: Screening mammogram in one year. (Code:SM-B-01Y) BI-RADS CATEGORY  1: Negative. Electronically Signed   By: Frederico Hamman M.D.   On: 12/25/2017 15:43    Assessment & Plan:   Janice Huang was seen today for hypertension.  Diagnoses and all orders for this visit:  Prediabetes- Her A1c is at 5.6%.  Medical therapy is not indicated. -     Ambulatory referral to Ophthalmology -     POCT glycosylated hemoglobin (Hb A1C) -     Basic metabolic panel; Future  Essential hypertension, benign- Her blood pressure is well controlled.  Electrolytes and renal function are normal. -     Basic metabolic panel; Future -     TSH; Future  Morbid obesity (HCC)- She agrees to improve her lifestyle modifications but she is not willing to use a pharmacological agent to help her lose weight.   I am having Janice Huang maintain her Colcrys, allopurinol, atorvastatin, and lisinopril.  No orders of the defined types were placed in this encounter.    Follow-up: Return in about 6 months (around 01/24/2019).  Sanda Linger, MD

## 2018-07-24 NOTE — Patient Instructions (Signed)

## 2018-07-25 ENCOUNTER — Encounter: Payer: Self-pay | Admitting: Internal Medicine

## 2018-11-05 ENCOUNTER — Other Ambulatory Visit: Payer: Self-pay | Admitting: Internal Medicine

## 2018-11-05 DIAGNOSIS — I10 Essential (primary) hypertension: Secondary | ICD-10-CM

## 2018-12-16 ENCOUNTER — Other Ambulatory Visit: Payer: Self-pay | Admitting: Internal Medicine

## 2018-12-16 DIAGNOSIS — M1000 Idiopathic gout, unspecified site: Secondary | ICD-10-CM

## 2018-12-17 ENCOUNTER — Other Ambulatory Visit: Payer: Self-pay | Admitting: Internal Medicine

## 2018-12-17 DIAGNOSIS — Z1231 Encounter for screening mammogram for malignant neoplasm of breast: Secondary | ICD-10-CM

## 2019-01-28 ENCOUNTER — Encounter: Payer: Managed Care, Other (non HMO) | Admitting: Internal Medicine

## 2019-01-30 ENCOUNTER — Other Ambulatory Visit: Payer: Self-pay

## 2019-01-30 ENCOUNTER — Ambulatory Visit
Admission: RE | Admit: 2019-01-30 | Discharge: 2019-01-30 | Disposition: A | Discharge status: Home / Self Care | Payer: Managed Care, Other (non HMO) | Source: Ambulatory Visit | Attending: Internal Medicine | Admitting: Internal Medicine

## 2019-01-30 DIAGNOSIS — Z1231 Encounter for screening mammogram for malignant neoplasm of breast: Secondary | ICD-10-CM

## 2019-01-31 LAB — HM MAMMOGRAPHY

## 2019-02-07 ENCOUNTER — Encounter: Payer: Managed Care, Other (non HMO) | Admitting: Obstetrics & Gynecology

## 2019-02-11 ENCOUNTER — Other Ambulatory Visit: Payer: Self-pay

## 2019-02-12 ENCOUNTER — Encounter: Payer: Self-pay | Admitting: Obstetrics & Gynecology

## 2019-02-12 ENCOUNTER — Ambulatory Visit (INDEPENDENT_AMBULATORY_CARE_PROVIDER_SITE_OTHER): Payer: Managed Care, Other (non HMO) | Admitting: Obstetrics & Gynecology

## 2019-02-12 VITALS — BP 130/84 | Ht 67.0 in | Wt 307.0 lb

## 2019-02-12 DIAGNOSIS — Z90710 Acquired absence of both cervix and uterus: Secondary | ICD-10-CM

## 2019-02-12 DIAGNOSIS — Z01419 Encounter for gynecological examination (general) (routine) without abnormal findings: Secondary | ICD-10-CM

## 2019-02-12 DIAGNOSIS — Z6841 Body Mass Index (BMI) 40.0 and over, adult: Secondary | ICD-10-CM | POA: Diagnosis not present

## 2019-02-12 LAB — HM PAP SMEAR

## 2019-02-12 NOTE — Progress Notes (Signed)
Janice Huang Oct 12, 1970 [id number]   History:    48 y.o. [id number] Boyfriend x 2 years.  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  RP:  Established patient presenting for annual gyn exam   HPI: S/P TAH/LSO for Fibroids in 2010.  No menopausal Sx. no pelvic pain.  No pain with intercourse.  Breasts normal.  Urine and bowel movements normal.  Not exercising regularly currently.  Gastric sleeve surgery 09/2014, but BMI reincreased to 48.08.  Health labs with Fam MD.   Past medical history,surgical history, family history and social history were all reviewed and documented in the EPIC chart.  Gynecologic History No LMP recorded. Patient has had a hysterectomy. Contraception: status post hysterectomy Last Pap: 05/2017. Results were: Result not available Last mammogram: 01/2019. Results were: normal Bone Density: Never Colonoscopy: Never  Obstetric History OB History  Gravida Para Term Preterm AB Living  4 3     1 3   SAB TAB Ectopic Multiple Live Births  1            # Outcome Date GA Lbr Len/2nd Weight Sex Delivery Anes PTL Lv  4 SAB           3 Para           2 Para           1 Para              ROS: A ROS was performed and pertinent positives and negatives are included in the history.  GENERAL: No fevers or chills. HEENT: No change in vision, no earache, sore throat or sinus congestion. NECK: No pain or stiffness. CARDIOVASCULAR: No chest pain or pressure. No palpitations. PULMONARY: No shortness of breath, cough or wheeze. GASTROINTESTINAL: No abdominal pain, nausea, vomiting or diarrhea, melena or bright red blood per rectum. GENITOURINARY: No urinary frequency, urgency, hesitancy or dysuria. MUSCULOSKELETAL: No joint or muscle pain, no back pain, no recent trauma. DERMATOLOGIC: No rash, no itching, no lesions. ENDOCRINE: No polyuria, polydipsia, no heat or cold intolerance. No recent change in weight. HEMATOLOGICAL: No anemia or easy bruising or bleeding. NEUROLOGIC: No headache,  seizures, numbness, tingling or weakness. PSYCHIATRIC: No depression, no loss of interest in normal activity or change in sleep pattern.     Exam:   BP 130/84    Ht 5\' 7"  (1.702 m)    Wt (!) 307 lb (139.3 kg)    BMI 48.08 kg/m   Body mass index is 48.08 kg/m.  General appearance : Well developed well nourished female. No acute distress HEENT: Eyes: no retinal hemorrhage or exudates,  Neck supple, trachea midline, no carotid bruits, no thyroidmegaly Lungs: Clear to auscultation, no rhonchi or wheezes, or rib retractions  Heart: Regular rate and rhythm, no murmurs or gallops Breast:Examined in sitting and supine position were symmetrical in appearance, no palpable masses or tenderness,  no skin retraction, no nipple inversion, no nipple discharge, no skin discoloration, no axillary or supraclavicular lymphadenopathy Abdomen: no palpable masses or tenderness, no rebound or guarding Extremities: no edema or skin discoloration or tenderness  Pelvic: Vulva: Normal             Vagina: No gross lesions or discharge.  Pap reflex done.  Cervix/Uterus absent  Adnexa  Without masses or tenderness  Anus: Normal   Assessment/Plan:  48 y.o. female for annual exam   1. Encounter for Papanicolaou smear of vagina as part of routine gynecological examination Gynecologic exam status post TAH.  Pap  reflex done on the vaginal vault.  Breast exam normal.  Screening mammogram September 2020 was negative.  Health labs with family physician.  2. S/P TAH (total abdominal hysterectomy)  3. Class 3 severe obesity due to excess calories with serious comorbidity and body mass index (BMI) of 45.0 to 49.9 in adult [facility]) Status post sleeve surgery.  Recommend a low calorie/carb diet such as [employer].  Aerobic physical activities 5 times a week and weightlifting every 2 days.  Genia Del MD, 3:53 PM 02/12/2019

## 2019-02-13 ENCOUNTER — Encounter: Payer: Self-pay | Admitting: Internal Medicine

## 2019-02-13 ENCOUNTER — Other Ambulatory Visit: Payer: Self-pay

## 2019-02-13 ENCOUNTER — Other Ambulatory Visit (INDEPENDENT_AMBULATORY_CARE_PROVIDER_SITE_OTHER): Payer: Managed Care, Other (non HMO)

## 2019-02-13 ENCOUNTER — Ambulatory Visit (INDEPENDENT_AMBULATORY_CARE_PROVIDER_SITE_OTHER): Payer: Managed Care, Other (non HMO) | Admitting: Internal Medicine

## 2019-02-13 VITALS — BP 138/88 | HR 71 | Temp 98.3°F | Resp 16 | Ht 67.0 in | Wt 308.0 lb

## 2019-02-13 DIAGNOSIS — M1000 Idiopathic gout, unspecified site: Secondary | ICD-10-CM

## 2019-02-13 DIAGNOSIS — E785 Hyperlipidemia, unspecified: Secondary | ICD-10-CM | POA: Diagnosis not present

## 2019-02-13 DIAGNOSIS — R7303 Prediabetes: Secondary | ICD-10-CM | POA: Diagnosis not present

## 2019-02-13 DIAGNOSIS — I10 Essential (primary) hypertension: Secondary | ICD-10-CM

## 2019-02-13 DIAGNOSIS — Z0000 Encounter for general adult medical examination without abnormal findings: Secondary | ICD-10-CM

## 2019-02-13 LAB — CBC WITH DIFFERENTIAL/PLATELET
Basophils Absolute: 0.1 10*3/uL (ref 0.0–0.1)
Basophils Relative: 0.9 % (ref 0.0–3.0)
Eosinophils Absolute: 0.1 10*3/uL (ref 0.0–0.7)
Eosinophils Relative: 1.8 % (ref 0.0–5.0)
HCT: 38.1 % (ref 36.0–46.0)
Hemoglobin: 12.7 g/dL (ref 12.0–15.0)
Lymphocytes Relative: 38.1 % (ref 12.0–46.0)
Lymphs Abs: 3 10*3/uL (ref 0.7–4.0)
MCHC: 33.2 g/dL (ref 30.0–36.0)
MCV: 86.4 fl (ref 78.0–100.0)
Monocytes Absolute: 0.9 10*3/uL (ref 0.1–1.0)
Monocytes Relative: 10.8 % (ref 3.0–12.0)
Neutro Abs: 3.9 10*3/uL (ref 1.4–7.7)
Neutrophils Relative %: 48.4 % (ref 43.0–77.0)
Platelets: 260 10*3/uL (ref 150.0–400.0)
RBC: 4.41 Mil/uL (ref 3.87–5.11)
RDW: 13.8 % (ref 11.5–15.5)
WBC: 8 10*3/uL (ref 4.0–10.5)

## 2019-02-13 LAB — PAP IG W/ RFLX HPV ASCU

## 2019-02-13 LAB — LIPID PANEL
Cholesterol: 271 mg/dL — ABNORMAL HIGH (ref 0–200)
HDL: 48.3 mg/dL (ref >39.00)
LDL Cholesterol: 193 mg/dL — ABNORMAL HIGH (ref 0–99)
NonHDL: 222.67
Total CHOL/HDL Ratio: 6
Triglycerides: 146 mg/dL (ref 0.0–149.0)
VLDL: 29.2 mg/dL (ref 0.0–40.0)

## 2019-02-13 LAB — HEMOGLOBIN A1C: Hgb A1c MFr Bld: 6.2 % (ref 4.6–6.5)

## 2019-02-13 LAB — BASIC METABOLIC PANEL
BUN: 11 mg/dL (ref 6–23)
CO2: 29 mEq/L (ref 19–32)
Calcium: 9.7 mg/dL (ref 8.4–10.5)
Chloride: 103 mEq/L (ref 96–112)
Creatinine, Ser: 0.84 mg/dL (ref 0.40–1.20)
GFR: 87.39 mL/min (ref >60.00)
Glucose, Bld: 85 mg/dL (ref 70–99)
Potassium: 4.7 mEq/L (ref 3.5–5.1)
Sodium: 139 mEq/L (ref 135–145)

## 2019-02-13 LAB — URIC ACID: Uric Acid, Serum: 8.7 mg/dL — ABNORMAL HIGH (ref 2.4–7.0)

## 2019-02-13 MED ORDER — LISINOPRIL 20 MG PO TABS: 20.0000 mg | ORAL_TABLET | Freq: Every day | ORAL | 1 refills | Status: DC

## 2019-02-13 NOTE — Progress Notes (Signed)
Subjective:  Patient ID: Janice MayerJacquetta Bernice Daily, female    DOB: [birth year]  Age: 48 y.o. MRN: [id number]  CC: Annual Exam, Hypertension, and Hyperlipidemia   [facility] for a CPX.  She saw her gynecologist 1 day prior to this visit.  She tells me that her blood pressure has been well controlled and she denies any recent episodes of headache, blurred vision, chest pain, or shortness of breath.  She admits that she is not taking the statin.  Outpatient Medications Prior to Visit  Medication Sig Dispense Refill  . allopurinol (ZYLOPRIM) 300 MG tablet TAKE 1 TABLET BY MOUTH EVERY DAY 90 tablet 1  . atorvastatin (LIPITOR) 40 MG tablet Take 1 tablet (40 mg total) by mouth daily. 90 tablet 1  . COLCRYS 0.6 MG tablet TAKE 1 TABLET BY MOUTH TWICE A DAY 270 tablet 0  . lisinopril (ZESTRIL) 20 MG tablet TAKE 1 TABLET BY MOUTH DAILY 90 tablet 1   No facility-administered medications prior to visit.     ROS Review of Huang  Constitutional: Positive for unexpected weight change (wt gain). Negative for chills, diaphoresis and fatigue.  HENT: Negative.   Eyes: Negative.   Respiratory: Negative for cough, chest tightness, shortness of breath and wheezing.   Cardiovascular: Negative for chest pain, palpitations and leg swelling.  Gastrointestinal: Negative for abdominal pain, blood in stool, constipation, diarrhea, nausea and vomiting.  Endocrine: Negative.   Genitourinary: Negative.  Negative for difficulty urinating.  Musculoskeletal: Negative for arthralgias and myalgias.  Skin: Negative.  Negative for color change and pallor.  Neurological: Negative.  Negative for dizziness, weakness and light-headedness.  Hematological: Negative for adenopathy. Does not bruise/bleed easily.  Psychiatric/Behavioral: Negative.     Objective:  BP 138/88 (BP Location: Left Arm, Patient Position: Sitting, Cuff Size: Large)   Pulse 71   Temp 98.3 F (36.8 C) (Oral)    Resp 16   Ht 5\' 7"  (1.702 m)   Wt (!) 308 lb (139.7 kg)   SpO2 99%   BMI 48.24 kg/m   BP Readings from Last 3 Encounters:  02/13/19 138/88  02/12/19 130/84  07/24/18 126/76    Wt Readings from Last 3 Encounters:  02/13/19 (!) 308 lb (139.7 kg)  02/12/19 (!) 307 lb (139.3 kg)  07/24/18 298 lb 8 oz (135.4 kg)    Physical Exam Vitals signs reviewed.  Constitutional:      Appearance: She is obese. She is not ill-appearing.  HENT:     Nose: Nose normal.     Mouth/Throat:     Mouth: Mucous membranes are moist.  Eyes:     General: No scleral icterus.    Conjunctiva/sclera: Conjunctivae normal.  Neck:     Musculoskeletal: Normal range of motion and neck supple.  Cardiovascular:     Rate and Rhythm: Normal rate and regular rhythm.     Heart sounds: No murmur.  Pulmonary:     Effort: Pulmonary effort is normal.     Breath sounds: No stridor. No wheezing, rhonchi or rales.  Abdominal:     General: Abdomen is protuberant. Bowel sounds are normal. There is no distension.     Palpations: Abdomen is soft. There is no hepatomegaly or splenomegaly.     Tenderness: There is no abdominal tenderness.  Musculoskeletal: Normal range of motion.     Right lower leg: No edema.     Left lower leg: No edema.  Lymphadenopathy:     Cervical: No cervical adenopathy.  Skin:  General: Skin is warm and dry.     Coloration: Skin is not pale.  Neurological:     General: No focal deficit present.     Mental Status: She is alert.  Psychiatric:        Mood and Affect: Mood normal.        Behavior: Behavior normal.        Thought Content: Thought content normal.        Judgment: Judgment normal.     Lab Results  Component Value Date   WBC 8.0 02/13/2019   HGB 12.7 02/13/2019   HCT 38.1 02/13/2019   PLT 260.0 02/13/2019   GLUCOSE 85 02/13/2019   CHOL 271 (H) 02/13/2019   TRIG 146.0 02/13/2019   HDL 48.30 02/13/2019   LDLDIRECT 217.2 05/23/2013   LDLCALC 193 (H) 02/13/2019   ALT 13  07/24/2018   AST 14 07/24/2018   NA 139 02/13/2019   K 4.7 02/13/2019   CL 103 02/13/2019   CREATININE 0.84 02/13/2019   BUN 11 02/13/2019   CO2 29 02/13/2019   TSH 1.51 07/24/2018   HGBA1C 6.2 02/13/2019   MICROALBUR 2.8 (H) 04/14/2015    Mm Digital Screening Bilateral  Result Date: 01/31/2019 CLINICAL DATA:  Screening. EXAM: DIGITAL SCREENING BILATERAL MAMMOGRAM WITH CAD COMPARISON:  Previous exam(s). ACR Breast Density Category b: There are scattered areas of fibroglandular density. FINDINGS: There are no findings suspicious for malignancy. Images were processed with CAD. IMPRESSION: No mammographic evidence of malignancy. A result letter of this screening mammogram will be mailed directly to the patient. RECOMMENDATION: Screening mammogram in one year. (Code:SM-B-01Y) BI-RADS CATEGORY  1: Negative. Electronically Signed   By: Amie Portland M.D.   On: 01/31/2019 13:57    Assessment & Plan:   [name] was seen today for annual exam, hypertension and hyperlipidemia.  Diagnoses and all orders for this visit:  Routine general medical examination at a health care facility- Exam completed, labs reviewed, she refused a flu vaccine today, Pap and mammogram are up-to-date, patient education was given. -     Lipid panel; Future  Essential hypertension, benign- Her blood pressure is adequately well controlled.  Electrolytes and renal function are normal. -     lisinopril (ZESTRIL) 20 MG tablet; Take 1 tablet (20 mg total) by mouth daily. -     Basic metabolic panel; Future -     CBC with Differential/Platelet; Future  Idiopathic gout, unspecified chronicity, unspecified site- Her uric acid level is too high.  I have asked her to be more compliant with allopurinol. -     Uric acid; Future -     allopurinol (ZYLOPRIM) 300 MG tablet; Take 1 tablet (300 mg total) by mouth daily.  Prediabetes- Medical therapy is not indicated. -     Hemoglobin A1c; Future  Hyperlipidemia with target LDL  less than 130- She has not achieved her LDL goal.  I have asked her to start taking the statin as directed. -     atorvastatin (LIPITOR) 40 MG tablet; Take 1 tablet (40 mg total) by mouth daily.   I have discontinued Jamese B. Marrazzo's Colcrys. I have also changed her lisinopril and allopurinol. Additionally, I am having her maintain her atorvastatin.  Meds ordered this encounter  Medications  . lisinopril (ZESTRIL) 20 MG tablet    Sig: Take 1 tablet (20 mg total) by mouth daily.    Dispense:  90 tablet    Refill:  1  . allopurinol (ZYLOPRIM) 300 MG  tablet    Sig: Take 1 tablet (300 mg total) by mouth daily.    Dispense:  90 tablet    Refill:  1  . atorvastatin (LIPITOR) 40 MG tablet    Sig: Take 1 tablet (40 mg total) by mouth daily.    Dispense:  90 tablet    Refill:  1     Follow-up: Return in about 6 months (around 08/13/2019).  Scarlette Calico, MD

## 2019-02-13 NOTE — Patient Instructions (Signed)

## 2019-02-14 ENCOUNTER — Encounter: Payer: Self-pay | Admitting: Internal Medicine

## 2019-02-14 MED ORDER — ALLOPURINOL 300 MG PO TABS: 300.0000 mg | ORAL_TABLET | Freq: Every day | ORAL | 1 refills | Status: DC

## 2019-02-14 MED ORDER — ATORVASTATIN CALCIUM 40 MG PO TABS: 40.0000 mg | ORAL_TABLET | Freq: Every day | ORAL | 1 refills | Status: DC

## 2019-02-15 ENCOUNTER — Encounter: Payer: Self-pay | Admitting: Obstetrics & Gynecology

## 2019-02-15 NOTE — Patient Instructions (Signed)
1. Encounter for Papanicolaou smear of vagina as part of routine gynecological examination Gynecologic exam status post TAH.  Pap reflex done on the vaginal vault.  Breast exam normal.  Screening mammogram September 2020 was negative.  Health labs with family physician.  2. S/P TAH (total abdominal hysterectomy)  3. Class 3 severe obesity due to excess calories with serious comorbidity and body mass index (BMI) of 45.0 to 49.9 in adult [facility]) Status post sleeve surgery.  Recommend a low calorie/carb diet such as [employer].  Aerobic physical activities 5 times a week and weightlifting every 2 days.  Janice Huang, it was a pleasure seeing you today!  I will inform you of your results as soon as they are available.

## 2019-06-11 ENCOUNTER — Telehealth: Payer: Self-pay

## 2019-06-11 NOTE — Telephone Encounter (Signed)
Patient wanted to know if STD testing done at last visit. Advised patient only a Pap smear was performed and it was normal.  She would like to return for STD testing.  I offered to transfer her to appts but she has a call to take and said she will call back to schedule.

## 2019-06-13 ENCOUNTER — Other Ambulatory Visit: Payer: Self-pay

## 2019-06-16 ENCOUNTER — Other Ambulatory Visit: Payer: Self-pay

## 2019-06-16 ENCOUNTER — Encounter: Payer: Self-pay | Admitting: Obstetrics & Gynecology

## 2019-06-16 ENCOUNTER — Ambulatory Visit: Payer: Managed Care, Other (non HMO) | Admitting: Obstetrics & Gynecology

## 2019-06-16 VITALS — BP 136/88

## 2019-06-16 DIAGNOSIS — Z113 Encounter for screening for infections with a predominantly sexual mode of transmission: Secondary | ICD-10-CM

## 2019-06-16 NOTE — Progress Notes (Signed)
    Janice Huang 11-16-1970 [id number]        49 y.o.  [id number] Longstanding boyfriend passed away 03-23-2019.  RP: STI screening   HPI: Longstanding boyfriend passed away Mar 23, 2019.  Patient would like STI screen prior to a new relationship.  Status post TAH/LSO for fibroids.  No pelvic pain.  No abnormal vaginal discharge.  No fever.  Urine and bowel movements normal.   OB History  Gravida Para Term Preterm AB Living  4 3     1 3   SAB TAB Ectopic Multiple Live Births  1            # Outcome Date GA Lbr Len/2nd Weight Sex Delivery Anes PTL Lv  4 SAB           3 Para           2 Para           1 Para             Past medical history,surgical history, problem list, medications, allergies, family history and social history were all reviewed and documented in the EPIC chart.   Directed ROS with pertinent positives and negatives documented in the history of present illness/assessment and plan.  Exam:  Vitals:   06/16/19 1456  BP: 136/88   General appearance:  Normal  Gynecologic exam: Vulva normal.  Speculum:  Vaginal vault normal.  Normal secretions.  Gono-Chlam done on vaginal vault.   Assessment/Plan:  49 y.o. [id number]   1. Screen for STD (sexually transmitted disease) Strict condom use strongly recommended. - Gono-Chlam on vaginal vault - HIV antibody (with reflex) - RPR - Hepatitis C Antibody - Hepatitis B Surface AntiGEN  52 MD, 3:21 PM 06/16/2019

## 2019-06-17 LAB — GC PROBE AMP THINPREP: N. gonorrhoeae RNA, TMA: NOT DETECTED

## 2019-06-17 LAB — HEPATITIS C ANTIBODY
Hepatitis C Ab: NONREACTIVE
SIGNAL TO CUT-OFF: 0.01 (ref <1.00)

## 2019-06-17 LAB — CHLAMYDIA PROBE AMP THINPREP: C. trachomatis RNA, TMA: NOT DETECTED

## 2019-06-17 LAB — HIV ANTIBODY (ROUTINE TESTING W REFLEX): HIV 1&2 Ab, 4th Generation: NONREACTIVE

## 2019-06-17 LAB — HEPATITIS B SURFACE ANTIGEN: Hepatitis B Surface Ag: NONREACTIVE

## 2019-06-17 LAB — RPR: RPR Ser Ql: NONREACTIVE

## 2019-06-20 ENCOUNTER — Encounter: Payer: Self-pay | Admitting: Obstetrics & Gynecology

## 2019-06-20 NOTE — Patient Instructions (Signed)
1. Screen for STD (sexually transmitted disease) Strict condom use strongly recommended. - Gono-Chlam on vaginal vault - HIV antibody (with reflex) - RPR - Hepatitis C Antibody - Hepatitis B Surface AntiGEN  Janice Huang, it was a pleasure seeing you today!  I will inform you of your results as soon as they are available.

## 2019-08-25 ENCOUNTER — Other Ambulatory Visit: Payer: Self-pay | Admitting: Internal Medicine

## 2019-08-25 DIAGNOSIS — I10 Essential (primary) hypertension: Secondary | ICD-10-CM

## 2019-09-24 ENCOUNTER — Other Ambulatory Visit: Payer: Self-pay | Admitting: Internal Medicine

## 2019-09-24 DIAGNOSIS — I10 Essential (primary) hypertension: Secondary | ICD-10-CM

## 2019-10-27 ENCOUNTER — Encounter: Payer: Self-pay | Admitting: Internal Medicine

## 2019-10-27 ENCOUNTER — Other Ambulatory Visit: Payer: Self-pay

## 2019-10-27 ENCOUNTER — Ambulatory Visit: Payer: Managed Care, Other (non HMO) | Admitting: Internal Medicine

## 2019-10-27 VITALS — BP 124/82 | HR 65 | Temp 98.0°F | Resp 16 | Ht 67.0 in | Wt 294.0 lb

## 2019-10-27 DIAGNOSIS — I10 Essential (primary) hypertension: Secondary | ICD-10-CM

## 2019-10-27 DIAGNOSIS — R7303 Prediabetes: Secondary | ICD-10-CM

## 2019-10-27 NOTE — Patient Instructions (Signed)

## 2019-10-27 NOTE — Progress Notes (Signed)
Subjective:  Patient ID: Janice Huang, female    DOB: [birth year]  Age: 49 y.o. MRN: [id number]  CC: Hypertension  This visit occurred during the SARS-CoV-2 public health emergency.  Safety protocols were in place, including screening questions prior to the visit, additional usage of staff PPE, and extensive cleaning of exam room while observing appropriate contact time as indicated for disinfecting solutions.    HPI Brittan Butterbaugh presents for f/up - She tells me her blood pressure is well controlled. She denies any recent episodes of dizziness, lightheadedness, chest pain, or shortness of breath.  Outpatient Medications Prior to Visit  Medication Sig Dispense Refill  . allopurinol (ZYLOPRIM) 300 MG tablet Take 1 tablet (300 mg total) by mouth daily. 90 tablet 1  . lisinopril (ZESTRIL) 20 MG tablet TAKE 1 TABLET BY MOUTH EVERY DAY 90 tablet 1  . atorvastatin (LIPITOR) 40 MG tablet Take 1 tablet (40 mg total) by mouth daily. (Patient not taking: Reported on 10/27/2019) 90 tablet 1   No facility-administered medications prior to visit.    ROS Review of Systems  Constitutional: Negative for appetite change, diaphoresis, fatigue and unexpected weight change.  HENT: Negative.   Eyes: Negative for visual disturbance.  Respiratory: Negative for cough, chest tightness, shortness of breath and wheezing.   Cardiovascular: Negative for chest pain, palpitations and leg swelling.  Gastrointestinal: Negative for abdominal pain, constipation, diarrhea, nausea and vomiting.  Endocrine: Negative.  Negative for heat intolerance.  Genitourinary: Negative.  Negative for difficulty urinating.  Musculoskeletal: Negative for arthralgias and myalgias.  Skin: Negative.  Negative for color change and pallor.    Objective:  BP 124/82 (BP Location: Left Arm, Patient Position: Sitting, Cuff Size: Large)   Pulse 65   Temp 98 F (36.7 C) (Oral)   Resp 16   Ht 5\' 7"  (1.702 m)   Wt  294 lb (133.4 kg)   SpO2 97%   BMI 46.05 kg/m   BP Readings from Last 3 Encounters:  10/27/19 124/82  06/16/19 136/88  02/13/19 138/88    Wt Readings from Last 3 Encounters:  10/27/19 294 lb (133.4 kg)  02/13/19 (!) 308 lb (139.7 kg)  02/12/19 (!) 307 lb (139.3 kg)    Physical Exam Vitals reviewed.  Constitutional:      Appearance: She is obese.  HENT:     Nose: Nose normal.     Mouth/Throat:     Mouth: Mucous membranes are moist.  Eyes:     General: No scleral icterus.    Conjunctiva/sclera: Conjunctivae normal.  Cardiovascular:     Rate and Rhythm: Normal rate and regular rhythm.     Heart sounds: No murmur.  Pulmonary:     Effort: Pulmonary effort is normal.     Breath sounds: No stridor. No wheezing, rhonchi or rales.  Abdominal:     General: Abdomen is protuberant. Bowel sounds are normal. There is no distension.     Palpations: Abdomen is soft. There is no hepatomegaly, splenomegaly or mass.     Tenderness: There is no abdominal tenderness.  Musculoskeletal:        General: Normal range of motion.     Cervical back: Neck supple.     Right lower leg: No edema.     Left lower leg: No edema.  Lymphadenopathy:     Cervical: No cervical adenopathy.  Skin:    General: Skin is warm and dry.  Neurological:     General: No focal deficit present.  Mental Status: She is alert.     Lab Results  Component Value Date   WBC 8.0 02/13/2019   HGB 12.7 02/13/2019   HCT 38.1 02/13/2019   PLT 260.0 02/13/2019   GLUCOSE 87 10/27/2019   CHOL 271 (H) 02/13/2019   TRIG 146.0 02/13/2019   HDL 48.30 02/13/2019   LDLDIRECT 217.2 05/23/2013   LDLCALC 193 (H) 02/13/2019   ALT 13 07/24/2018   AST 14 07/24/2018   NA 136 10/27/2019   K 4.1 10/27/2019   CL 102 10/27/2019   CREATININE 0.87 10/27/2019   BUN 12 10/27/2019   CO2 28 10/27/2019   TSH 1.51 07/24/2018   HGBA1C 6.1 10/27/2019   MICROALBUR 2.8 (H) 04/14/2015    MM DIGITAL SCREENING BILATERAL  Result  Date: 01/31/2019 CLINICAL DATA:  Screening. EXAM: DIGITAL SCREENING BILATERAL MAMMOGRAM WITH CAD COMPARISON:  Previous exam(s). ACR Breast Density Category b: There are scattered areas of fibroglandular density. FINDINGS: There are no findings suspicious for malignancy. Images were processed with CAD. IMPRESSION: No mammographic evidence of malignancy. A result letter of this screening mammogram will be mailed directly to the patient. RECOMMENDATION: Screening mammogram in one year. (Code:SM-B-01Y) BI-RADS CATEGORY  1: Negative. Electronically Signed   By: Amie Portland M.D.   On: 01/31/2019 13:57    Assessment & Plan:   Camela was seen today for hypertension.  Diagnoses and all orders for this visit:  Prediabetes- Her A1c is at 6.1%. Her blood sugar is adequately well controlled. Medical therapy is not indicated. -     Basic metabolic panel; Future -     Hemoglobin A1c; Future -     Hemoglobin A1c -     Basic metabolic panel  Essential hypertension, benign- Her blood pressure is adequately well controlled. Electrolytes and renal function are normal. -     Basic metabolic panel; Future -     Basic metabolic panel   I am having Asianna B. Crull maintain her allopurinol, atorvastatin, and lisinopril.  No orders of the defined types were placed in this encounter.    Follow-up: Return in about 6 months (around 04/27/2020).  Sanda Linger, MD

## 2019-10-28 ENCOUNTER — Encounter: Payer: Self-pay | Admitting: Internal Medicine

## 2019-10-28 LAB — BASIC METABOLIC PANEL
BUN: 12 mg/dL (ref 6–23)
CO2: 28 mEq/L (ref 19–32)
Calcium: 9.1 mg/dL (ref 8.4–10.5)
Chloride: 102 mEq/L (ref 96–112)
Creatinine, Ser: 0.87 mg/dL (ref 0.40–1.20)
GFR: 83.68 mL/min (ref >60.00)
Glucose, Bld: 87 mg/dL (ref 70–99)
Potassium: 4.1 mEq/L (ref 3.5–5.1)
Sodium: 136 mEq/L (ref 135–145)

## 2019-10-28 LAB — HEMOGLOBIN A1C: Hgb A1c MFr Bld: 6.1 % (ref 4.6–6.5)

## 2020-01-09 ENCOUNTER — Other Ambulatory Visit: Payer: Self-pay | Admitting: Internal Medicine

## 2020-01-09 DIAGNOSIS — Z1231 Encounter for screening mammogram for malignant neoplasm of breast: Secondary | ICD-10-CM

## 2020-02-02 ENCOUNTER — Ambulatory Visit
Admission: RE | Admit: 2020-02-02 | Discharge: 2020-02-02 | Disposition: A | Discharge status: Home / Self Care | Payer: Managed Care, Other (non HMO) | Source: Ambulatory Visit | Attending: Internal Medicine | Admitting: Internal Medicine

## 2020-02-02 ENCOUNTER — Other Ambulatory Visit: Payer: Self-pay

## 2020-02-02 DIAGNOSIS — Z1231 Encounter for screening mammogram for malignant neoplasm of breast: Secondary | ICD-10-CM

## 2020-02-03 ENCOUNTER — Encounter: Payer: Self-pay | Admitting: Podiatry

## 2020-02-03 ENCOUNTER — Ambulatory Visit: Payer: Managed Care, Other (non HMO) | Admitting: Podiatry

## 2020-02-03 DIAGNOSIS — L600 Ingrowing nail: Secondary | ICD-10-CM

## 2020-02-04 ENCOUNTER — Telehealth: Payer: Self-pay | Admitting: Internal Medicine

## 2020-02-04 ENCOUNTER — Other Ambulatory Visit: Payer: Self-pay | Admitting: Internal Medicine

## 2020-02-04 DIAGNOSIS — R928 Other abnormal and inconclusive findings on diagnostic imaging of breast: Secondary | ICD-10-CM

## 2020-02-04 NOTE — Telephone Encounter (Signed)
    Patient calling to discuss mammogram results 

## 2020-02-04 NOTE — Progress Notes (Signed)
Subjective:  Patient ID: Janice Huang, female    DOB: [birth year],  MRN: [id number] HPI Chief Complaint  Patient presents with  . Nail Problem    Hallux right - dark spot on nail, just noticed last week (she had nail polish on), whole toe throbs, trouble with ingrowns in the past  . New Patient (Initial Visit)   ROS: Denies fever chills nausea vomiting muscle aches pains calf pain back pain chest pain shortness of breath.  Past Medical History:  Diagnosis Date  . Gout attack    last flare up last week, in toes  . Headache   . History of hiatal hernia    small  . Hyperlipidemia   . Hypertension    Past Surgical History:  Procedure Laterality Date  .  c section x 2    . ABDOMINAL HYSTERECTOMY     1 ovary removed  . LAPAROSCOPIC GASTRIC SLEEVE RESECTION N/A 09/21/2014   Procedure: LAPAROSCOPIC GASTRIC SLEEVE RESECTION WITH HIATAL HERNIA REPAIR;  Surgeon: Luretha Murphy, MD;  Location: [facility];  Service: General;  Laterality: N/A;  . TUBAL LIGATION      Current Outpatient Medications:  .  allopurinol (ZYLOPRIM) 300 MG tablet, Take 1 tablet (300 mg total) by mouth daily., Disp: 90 tablet, Rfl: 1 .  lisinopril (ZESTRIL) 20 MG tablet, TAKE 1 TABLET BY MOUTH EVERY DAY, Disp: 90 tablet, Rfl: 1  Allergies  Allergen Reactions  . Codeine     REACTION: itching and hives  . Penicillins Itching   Review of Systems Objective:  There were no vitals filed for this visit.  General: Well developed, nourished, in no acute distress, alert and oriented x3   Dermatological: Skin is warm, dry and supple bilateral. Nails x 10 are well maintained; remaining integument appears unremarkable at this time. There are no open sores, no preulcerative lesions, no rash or signs of infection present.  Hallux nail plate is demonstrated sharp incurvated nail margin along the tibiofibular border there is discoloration along the fibular border at the proximal nail fold and one that appears to be  growing out in the central area of that lateral border.  This does appear to be blood or some type of trauma to the nail however with the 1 proximal margin we have to question whether or not this is a pigmented lesion that is problematic.  Vascular: Dorsalis Pedis artery and Posterior Tibial artery pedal pulses are 2/4 bilateral with immedate capillary fill time. Pedal hair growth present. No varicosities and no lower extremity edema present bilateral.   Neruologic: Grossly intact via light touch bilateral. Vibratory intact via tuning fork bilateral. Protective threshold with Semmes Wienstein monofilament intact to all pedal sites bilateral. Patellar and Achilles deep tendon reflexes 2+ bilateral. No Babinski or clonus noted bilateral.   Musculoskeletal: No gross boney pedal deformities bilateral. No pain, crepitus, or limitation noted with foot and ankle range of motion bilateral. Muscular strength 5/5 in all groups tested bilateral.  She has pain on range of motion of the first metatarsophalangeal joint of the right foot as well as the hallux interphalangeal joint.  Gait: Unassisted, Nonantalgic.    Radiographs:  None taken  Assessment & Plan:   Assessment: Ingrown nails hallux right.  Discoloration of nail small lesion lateral hallux questionable.  Arthritic changes most likely hallux and metatarsophalangeal joint.  Plan: ROS: She denies fever chills nausea vomiting muscle aches pains calf pain back pain chest pain shortness of breath.  She states that this point  she would like to come back to have her biopsy performed in the nail margin removed.  We will remove the nail margin and perform a punch biopsy to the proximal lesion if it is still present or not growing out at the next visit.  We will also do an x-ray of the first metatarsophalangeal joint right foot at that time.     Shareef Eddinger T. [place], [place]

## 2020-02-12 ENCOUNTER — Ambulatory Visit: Payer: Managed Care, Other (non HMO) | Admitting: Podiatry

## 2020-02-12 ENCOUNTER — Encounter: Payer: Managed Care, Other (non HMO) | Admitting: Podiatry

## 2020-02-12 ENCOUNTER — Ambulatory Visit (INDEPENDENT_AMBULATORY_CARE_PROVIDER_SITE_OTHER): Payer: Managed Care, Other (non HMO)

## 2020-02-12 ENCOUNTER — Other Ambulatory Visit: Payer: Self-pay

## 2020-02-12 ENCOUNTER — Ambulatory Visit: Payer: Managed Care, Other (non HMO)

## 2020-02-12 DIAGNOSIS — Q666 Other congenital valgus deformities of feet: Secondary | ICD-10-CM

## 2020-02-12 DIAGNOSIS — M778 Other enthesopathies, not elsewhere classified: Secondary | ICD-10-CM

## 2020-02-13 ENCOUNTER — Encounter: Payer: Self-pay | Admitting: Podiatry

## 2020-02-13 NOTE — Progress Notes (Signed)
Subjective:  Patient ID: Janice Huang, female    DOB: Oct 06, 1970,  MRN: [id number]  Chief Complaint  Patient presents with  . Ingrown Toenail    Possible ingrown on right foot- pt mentioned she was seen by dr Al Corpus- pt mentioned she is wanted to get it taken care of due to having left side nail removed     49 y.o. female presents with the above complaint. Patient presents with complaint of right first metatarsophalangeal joint pain with a history of having gout. Patient does not have a gout flare however she does have a little pain when palpated. She also has secondary complaint of the discoloration the nail for which she wanted to get it evaluated. She is known to Dr. Al Corpus. She denies any other acute complaints. She would like to discuss various treatment options. She has never worn orthotics.   Review of Systems: Negative except as noted in the HPI. Denies N/V/F/Ch.  Past Medical History:  Diagnosis Date  . Gout attack    last flare up last week, in toes  . Headache   . History of hiatal hernia    small  . Hyperlipidemia   . Hypertension     Current Outpatient Medications:  .  allopurinol (ZYLOPRIM) 300 MG tablet, Take 1 tablet (300 mg total) by mouth daily., Disp: 90 tablet, Rfl: 1 .  lisinopril (ZESTRIL) 20 MG tablet, TAKE 1 TABLET BY MOUTH EVERY DAY, Disp: 90 tablet, Rfl: 1  Social History   Tobacco Use  Smoking Status Never Smoker  Smokeless Tobacco Never Used    Allergies  Allergen Reactions  . Codeine     REACTION: itching and hives  . Penicillins Itching   Objective:  There were no vitals filed for this visit. There is no height or weight on file to calculate BMI. Constitutional Well developed. Well nourished.  Vascular Dorsalis pedis pulses palpable bilaterally. Posterior tibial pulses palpable bilaterally. Capillary refill normal to all digits.  No cyanosis or clubbing noted. Pedal hair growth normal.  Neurologic Normal speech. Oriented  to person, place, and time. Epicritic sensation to light touch grossly present bilaterally.  Dermatologic Nails well groomed and normal in appearance. No open wounds. No skin lesions.  Orthopedic:  Very mild pain on palpation of first metatarsophalangeal joint on the right side. No intra-articular pain noted no crepitus noted to the first metatarsophalangeal joint.   Radiographs: 3 views of skeletally mature adult right foot: Extra-articular rat-bite sign noted consistent with gout to the first metatarsophalangeal joint. No arthritic changes noted within the joint. No other bony abnormalities identified. There is decreasing calcaneal inclination angle increase in talar declination angle anterior break in the cyma line consistent with pes planovalgus deformity Assessment:   1. Capsulitis of foot, right   2. Pes planovalgus    Plan:  Patient was evaluated and treated and all questions answered.  Right first metatarsophalangeal joint capsulitis with underlying history of gout -I explained patient the etiology of capsulitis of her extremity options were discussed. Given that patient has radiographic evidence of gout changes that could likely be attributing to some of her pain. This is very consistent when ambulating. I believe patient will benefit from custom-made orthotics with Morton's extension. At this time I will hold off on any kind of steroid injection and encouraged her that if she does have a gout flare to come back and see me. Her pain is very mild today and therefore no injection is warranted.  Pes planovalgus semiflexible -I  explained patient the etiology of pes planovalgus and various treatment options were discussed. I believe patient will benefit from control of the hindfoot motion and support of the arch as well as Morton's extension to bilateral first metatarsophalangeal joint to take the stress off of the first MPJ. Patient agrees with the plan would like to obtain  orthotics. -Should be scheduled to see Raiford Noble for custom-made orthotics with incorporation of bilateral Morton's extension  No follow-ups on file.

## 2020-02-16 ENCOUNTER — Ambulatory Visit
Admission: RE | Admit: 2020-02-16 | Discharge: 2020-02-16 | Disposition: A | Discharge status: Home / Self Care | Payer: Managed Care, Other (non HMO) | Source: Ambulatory Visit | Attending: Internal Medicine | Admitting: Internal Medicine

## 2020-02-16 ENCOUNTER — Other Ambulatory Visit: Payer: Self-pay | Admitting: Internal Medicine

## 2020-02-16 ENCOUNTER — Other Ambulatory Visit: Payer: Self-pay

## 2020-02-16 DIAGNOSIS — R928 Other abnormal and inconclusive findings on diagnostic imaging of breast: Secondary | ICD-10-CM

## 2020-02-19 NOTE — Progress Notes (Signed)
This encounter was created in error - please disregard.

## 2020-02-23 ENCOUNTER — Ambulatory Visit
Admission: RE | Admit: 2020-02-23 | Discharge: 2020-02-23 | Disposition: A | Discharge status: Home / Self Care | Payer: Managed Care, Other (non HMO) | Source: Ambulatory Visit | Attending: Internal Medicine | Admitting: Internal Medicine

## 2020-02-23 ENCOUNTER — Other Ambulatory Visit: Payer: Self-pay

## 2020-02-23 DIAGNOSIS — R928 Other abnormal and inconclusive findings on diagnostic imaging of breast: Secondary | ICD-10-CM

## 2020-02-23 HISTORY — PX: BREAST BIOPSY: SHX20

## 2020-03-03 ENCOUNTER — Other Ambulatory Visit: Payer: Self-pay | Admitting: Internal Medicine

## 2020-03-03 DIAGNOSIS — I10 Essential (primary) hypertension: Secondary | ICD-10-CM

## 2020-03-03 DIAGNOSIS — M1000 Idiopathic gout, unspecified site: Secondary | ICD-10-CM

## 2020-03-03 MED ORDER — LISINOPRIL 20 MG PO TABS: 20.0000 mg | ORAL_TABLET | Freq: Every day | ORAL | 1 refills | Status: DC

## 2020-03-03 NOTE — Telephone Encounter (Signed)
allopurinol (ZYLOPRIM) 300 MG tablet COLCRYS 0.6 MG tablet [[id number]]  lisinopril (ZESTRIL) 20 MG tablet Patient requesting a refill for all three prescriptions   Last visit-10/27/19 Next visit-12/008/21 [id number] [place], [place] - [address] Phone:  [phone]  Fax:  [phone]

## 2020-03-04 ENCOUNTER — Telehealth: Payer: Self-pay | Admitting: Internal Medicine

## 2020-03-04 NOTE — Telephone Encounter (Signed)
Patient dropped off yearly wellness form from insurance company.   Form has been completed and placed in providers box to review and sign.

## 2020-03-05 ENCOUNTER — Other Ambulatory Visit: Payer: Managed Care, Other (non HMO) | Admitting: Orthotics

## 2020-03-05 NOTE — Telephone Encounter (Signed)
COLCRYS 0.6 MG tablet [[id number]]  lisinopril (ZESTRIL) 20 MG tablet Patient went to the pharmacy and the pharmacy states allopurinol was the only medication they received. Patient stated she is having a gout attack. Curious if she only needs the allopurinol to help this.  Phone # [phone]

## 2020-03-05 NOTE — Telephone Encounter (Signed)
Form has been emailed to [email or web address], Copy sent to to scan.   LVM to inform patient and original is ready to be picked up.

## 2020-03-08 ENCOUNTER — Other Ambulatory Visit: Payer: Self-pay | Admitting: Internal Medicine

## 2020-03-08 DIAGNOSIS — M1000 Idiopathic gout, unspecified site: Secondary | ICD-10-CM

## 2020-03-08 DIAGNOSIS — M109 Gout, unspecified: Secondary | ICD-10-CM

## 2020-03-08 MED ORDER — COLCHICINE 0.6 MG PO TABS: 0.6000 mg | ORAL_TABLET | Freq: Two times a day (BID) | ORAL | 0 refills | Status: DC

## 2020-03-08 NOTE — Telephone Encounter (Addendum)
° ° °  Patient upset gout medication was not called to pharmacy. Patient states she was in pain over the weekend Requesting call

## 2020-03-17 ENCOUNTER — Other Ambulatory Visit: Payer: Managed Care, Other (non HMO) | Admitting: Orthotics

## 2020-03-23 ENCOUNTER — Ambulatory Visit (INDEPENDENT_AMBULATORY_CARE_PROVIDER_SITE_OTHER): Payer: Managed Care, Other (non HMO) | Admitting: Orthotics

## 2020-03-23 ENCOUNTER — Other Ambulatory Visit: Payer: Self-pay

## 2020-03-23 DIAGNOSIS — M216X2 Other acquired deformities of left foot: Secondary | ICD-10-CM | POA: Diagnosis not present

## 2020-03-23 DIAGNOSIS — M216X1 Other acquired deformities of right foot: Secondary | ICD-10-CM

## 2020-03-23 DIAGNOSIS — R7303 Prediabetes: Secondary | ICD-10-CM | POA: Diagnosis not present

## 2020-03-23 DIAGNOSIS — M778 Other enthesopathies, not elsewhere classified: Secondary | ICD-10-CM | POA: Diagnosis not present

## 2020-03-29 NOTE — Progress Notes (Signed)
Patient cast today for f/o for b/l issues of gout/first mpj pain; plan on semirigid device that takes has morton's extension built in.

## 2020-03-30 ENCOUNTER — Other Ambulatory Visit: Payer: Self-pay | Admitting: Internal Medicine

## 2020-03-30 DIAGNOSIS — M1000 Idiopathic gout, unspecified site: Secondary | ICD-10-CM

## 2020-03-30 DIAGNOSIS — M109 Gout, unspecified: Secondary | ICD-10-CM

## 2020-04-19 ENCOUNTER — Other Ambulatory Visit: Payer: Self-pay

## 2020-04-19 ENCOUNTER — Ambulatory Visit: Payer: Managed Care, Other (non HMO) | Admitting: Orthotics

## 2020-04-19 DIAGNOSIS — M778 Other enthesopathies, not elsewhere classified: Secondary | ICD-10-CM

## 2020-04-19 DIAGNOSIS — Q666 Other congenital valgus deformities of feet: Secondary | ICD-10-CM

## 2020-04-19 NOTE — Progress Notes (Signed)
Patient picked up f/o and was pleased with fit, comfort, and function.  Worked well with footwear.  Told of rbeak in period and how to report any issues.  

## 2020-04-28 ENCOUNTER — Other Ambulatory Visit: Payer: Self-pay

## 2020-04-28 ENCOUNTER — Encounter: Payer: Self-pay | Admitting: Internal Medicine

## 2020-04-28 ENCOUNTER — Ambulatory Visit: Payer: Managed Care, Other (non HMO) | Admitting: Internal Medicine

## 2020-04-28 VITALS — BP 128/86 | HR 66 | Temp 98.4°F | Ht 67.0 in | Wt 292.0 lb

## 2020-04-28 DIAGNOSIS — Z9884 Bariatric surgery status: Secondary | ICD-10-CM

## 2020-04-28 DIAGNOSIS — I10 Essential (primary) hypertension: Secondary | ICD-10-CM | POA: Diagnosis not present

## 2020-04-28 DIAGNOSIS — E785 Hyperlipidemia, unspecified: Secondary | ICD-10-CM

## 2020-04-28 DIAGNOSIS — M1000 Idiopathic gout, unspecified site: Secondary | ICD-10-CM

## 2020-04-28 DIAGNOSIS — R7303 Prediabetes: Secondary | ICD-10-CM

## 2020-04-28 DIAGNOSIS — Z0000 Encounter for general adult medical examination without abnormal findings: Secondary | ICD-10-CM | POA: Diagnosis not present

## 2020-04-28 DIAGNOSIS — E559 Vitamin D deficiency, unspecified: Secondary | ICD-10-CM

## 2020-04-28 DIAGNOSIS — Z1211 Encounter for screening for malignant neoplasm of colon: Secondary | ICD-10-CM

## 2020-04-28 DIAGNOSIS — Z0001 Encounter for general adult medical examination with abnormal findings: Secondary | ICD-10-CM | POA: Insufficient documentation

## 2020-04-28 NOTE — Patient Instructions (Signed)
Health Maintenance, Female Adopting a healthy lifestyle and getting preventive care are important in promoting health and wellness. Ask your health care provider about:  The right schedule for you to have regular tests and exams.  Things you can do on your own to prevent diseases and keep yourself healthy. What should I know about diet, weight, and exercise? Eat a healthy diet   Eat a diet that includes plenty of vegetables, fruits, low-fat dairy products, and lean protein.  Do not eat a lot of foods that are high in solid fats, added sugars, or sodium. Maintain a healthy weight Body mass index (BMI) is used to identify weight problems. It estimates body fat based on height and weight. Your health care provider can help determine your BMI and help you achieve or maintain a healthy weight. Get regular exercise Get regular exercise. This is one of the most important things you can do for your health. Most adults should:  Exercise for at least 150 minutes each week. The exercise should increase your heart rate and make you sweat (moderate-intensity exercise).  Do strengthening exercises at least twice a week. This is in addition to the moderate-intensity exercise.  Spend less time sitting. Even light physical activity can be beneficial. Watch cholesterol and blood lipids Have your blood tested for lipids and cholesterol at 49 years of age, then have this test every 5 years. Have your cholesterol levels checked more often if:  Your lipid or cholesterol levels are high.  You are older than 49 years of age.  You are at high risk for heart disease. What should I know about cancer screening? Depending on your health history and family history, you may need to have cancer screening at various ages. This may include screening for:  Breast cancer.  Cervical cancer.  Colorectal cancer.  Skin cancer.  Lung cancer. What should I know about heart disease, diabetes, and high blood  pressure? Blood pressure and heart disease  High blood pressure causes heart disease and increases the risk of stroke. This is more likely to develop in people who have high blood pressure readings, are of African descent, or are overweight.  Have your blood pressure checked: ? Every 3-5 years if you are 18-39 years of age. ? Every year if you are 40 years old or older. Diabetes Have regular diabetes screenings. This checks your fasting blood sugar level. Have the screening done:  Once every three years after age 40 if you are at a normal weight and have a low risk for diabetes.  More often and at a younger age if you are overweight or have a high risk for diabetes. What should I know about preventing infection? Hepatitis B If you have a higher risk for hepatitis B, you should be screened for this virus. Talk with your health care provider to find out if you are at risk for hepatitis B infection. Hepatitis C Testing is recommended for:  Everyone born from 1945 through 1965.  Anyone with known risk factors for hepatitis C. Sexually transmitted infections (STIs)  Get screened for STIs, including gonorrhea and chlamydia, if: ? You are sexually active and are younger than 49 years of age. ? You are older than 49 years of age and your health care provider tells you that you are at risk for this type of infection. ? Your sexual activity has changed since you were last screened, and you are at increased risk for chlamydia or gonorrhea. Ask your health care provider if   you are at risk.  Ask your health care provider about whether you are at high risk for HIV. Your health care provider may recommend a prescription medicine to help prevent HIV infection. If you choose to take medicine to prevent HIV, you should first get tested for HIV. You should then be tested every 3 months for as long as you are taking the medicine. Pregnancy  If you are about to stop having your period (premenopausal) and  you may become pregnant, seek counseling before you get pregnant.  Take 400 to 800 micrograms (mcg) of folic acid every day if you become pregnant.  Ask for birth control (contraception) if you want to prevent pregnancy. Osteoporosis and menopause Osteoporosis is a disease in which the bones lose minerals and strength with aging. This can result in bone fractures. If you are 65 years old or older, or if you are at risk for osteoporosis and fractures, ask your health care provider if you should:  Be screened for bone loss.  Take a calcium or vitamin D supplement to lower your risk of fractures.  Be given hormone replacement therapy (HRT) to treat symptoms of menopause. Follow these instructions at home: Lifestyle  Do not use any products that contain nicotine or tobacco, such as cigarettes, e-cigarettes, and chewing tobacco. If you need help quitting, ask your health care provider.  Do not use street drugs.  Do not share needles.  Ask your health care provider for help if you need support or information about quitting drugs. Alcohol use  Do not drink alcohol if: ? Your health care provider tells you not to drink. ? You are pregnant, may be pregnant, or are planning to become pregnant.  If you drink alcohol: ? Limit how much you use to 0-1 drink a day. ? Limit intake if you are breastfeeding.  Be aware of how much alcohol is in your drink. In the [place] one drink equals one 12 oz bottle of beer (355 mL), one 5 oz glass of wine (148 mL), or one 1 oz glass of hard liquor (44 mL). General instructions  Schedule regular health, dental, and eye exams.  Stay current with your vaccines.  Tell your health care provider if: ? You often feel depressed. ? You have ever been abused or do not feel safe at home. Summary  Adopting a healthy lifestyle and getting preventive care are important in promoting health and wellness.  Follow your health care provider's instructions about healthy  diet, exercising, and getting tested or screened for diseases.  Follow your health care provider's instructions on monitoring your cholesterol and blood pressure. This information is not intended to replace advice given to you by your health care provider. Make sure you discuss any questions you have with your health care provider. Document Revised: 05/01/2018 Document Reviewed: 05/01/2018 Elsevier Patient Education  2020 Elsevier Inc.  

## 2020-04-28 NOTE — Progress Notes (Signed)
Subjective:  Patient ID: Janice Huang, female    DOB: May 12, 1971  Age: 49 y.o. MRN: [id number]  CC: Annual Exam and Hypertension  This visit occurred during the SARS-CoV-2 public health emergency.  Safety protocols were in place, including screening questions prior to the visit, additional usage of staff PPE, and extensive cleaning of exam room while observing appropriate contact time as indicated for disinfecting solutions.    HPI Janice Huang presents for a CPX.  She thinks she has had a few gout flares recently.  Her symptoms are well controlled with colchicine and allopurinol.  She tells me her blood pressure has been well controlled.  She denies any recent episodes of chest pain, shortness of breath, palpitations, edema, or fatigue.  She is status post bariatric surgery.  She tells me she is not taking any vitamin supplements.  Outpatient Medications Prior to Visit  Medication Sig Dispense Refill  . allopurinol (ZYLOPRIM) 300 MG tablet TAKE 1 TABLET BY MOUTH EVERY DAY 90 tablet 1  . colchicine 0.6 MG tablet TAKE 1 TABLET BY MOUTH 2 TIMES DAILY. 60 tablet 2  . lisinopril (ZESTRIL) 20 MG tablet Take 1 tablet (20 mg total) by mouth daily. 90 tablet 1   No facility-administered medications prior to visit.    ROS Review of Systems  Constitutional: Negative for chills, diaphoresis, fatigue and fever.  HENT: Negative.   Eyes: Negative for visual disturbance.  Respiratory: Negative for cough, chest tightness, shortness of breath and wheezing.   Cardiovascular: Negative for chest pain, palpitations and leg swelling.  Gastrointestinal: Negative for abdominal pain, constipation, diarrhea, nausea and vomiting.  Endocrine: Negative.   Genitourinary: Negative.  Negative for difficulty urinating.  Musculoskeletal: Positive for arthralgias. Negative for joint swelling and myalgias.  Skin: Negative for color change and pallor.  Neurological: Negative.  Negative for  dizziness, weakness, light-headedness, numbness and headaches.  Hematological: Negative for adenopathy. Does not bruise/bleed easily.  Psychiatric/Behavioral: Negative.     Objective:  BP 128/86   Pulse 66   Temp 98.4 F (36.9 C) (Oral)   Ht 5\' 7"  (1.702 m)   Wt 292 lb (132.5 kg)   SpO2 98%   BMI 45.73 kg/m   BP Readings from Last 3 Encounters:  04/28/20 128/86  10/27/19 124/82  06/16/19 136/88    Wt Readings from Last 3 Encounters:  04/28/20 292 lb (132.5 kg)  10/27/19 294 lb (133.4 kg)  02/13/19 (!) 308 lb (139.7 kg)    Physical Exam Vitals reviewed.  Constitutional:      General: She is not in acute distress.    Appearance: She is obese. She is not toxic-appearing or diaphoretic.  HENT:     Nose: Nose normal.     Mouth/Throat:     Mouth: Mucous membranes are moist.  Eyes:     General: No scleral icterus.    Conjunctiva/sclera: Conjunctivae normal.  Cardiovascular:     Rate and Rhythm: Normal rate and regular rhythm.     Heart sounds: No murmur heard.   Pulmonary:     Effort: Pulmonary effort is normal.     Breath sounds: No stridor. No wheezing, rhonchi or rales.  Abdominal:     General: Abdomen is flat.     Palpations: There is no mass.     Tenderness: There is no abdominal tenderness. There is no guarding.  Musculoskeletal:        General: Normal range of motion.     Cervical back: Neck supple.  Right lower leg: No edema.     Left lower leg: No edema.  Lymphadenopathy:     Cervical: No cervical adenopathy.  Skin:    General: Skin is warm and dry.     Coloration: Skin is not pale.  Neurological:     General: No focal deficit present.     Mental Status: She is alert.  Psychiatric:        Mood and Affect: Mood normal.        Behavior: Behavior normal.     Lab Results  Component Value Date   WBC 7.9 04/30/2020   HGB 14.5 04/30/2020   HCT 43.1 04/30/2020   PLT 230 04/30/2020   GLUCOSE 84 04/30/2020   CHOL 257 (H) 04/30/2020   TRIG 169  (H) 04/30/2020   HDL 53 04/30/2020   LDLDIRECT 217.2 05/23/2013   LDLCALC 173 (H) 04/30/2020   ALT 20 04/30/2020   AST 19 04/30/2020   NA 141 04/30/2020   K 4.4 04/30/2020   CL 103 04/30/2020   CREATININE 0.82 04/30/2020   BUN 11 04/30/2020   CO2 26 04/30/2020   TSH 1.500 04/30/2020   HGBA1C 5.7 (H) 04/30/2020   MICROALBUR 2.8 (H) 04/14/2015    MM CLIP PLACEMENT RIGHT  Result Date: 02/23/2020 CLINICAL DATA:  Status post stereotactic guided core needle biopsy of a 2 cm group of calcifications in the upper, slightly outer right breast. EXAM: DIAGNOSTIC RIGHT MAMMOGRAM POST STEREOTACTIC BIOPSY COMPARISON:  Previous exam(s). FINDINGS: Mammographic images were obtained following stereotactic guided biopsy of the recently demonstrated 2 cm group of calcifications in the upper, slightly outer right breast. The biopsy marking clip is in expected position at the site of biopsy. IMPRESSION: Appropriate positioning of the coil shaped biopsy marking clip at the site of biopsy in the upper, slightly outer right breast. Final Assessment: Post Procedure Mammograms for Marker Placement Electronically Signed   By: Beckie Salts M.D.   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 This was found to be concordant by Dr. Beckie Salts. Pathology results were discussed with the patient by telephone. The patient reported doing well after the biopsy with tenderness and bleeding at the site. Post biopsy instructions and care were reviewed and questions were answered. The patient was encouraged to call [facility] for any additional concerns. My direct phone number was provided. The patient was instructed to return for annual screening mammography in September 2022.  Pathology results reported by Rene Kocher, RN on 02/24/2020. Electronically Signed   By: Beckie Salts M.D.   On: 02/24/2020 13:20   Result Date: 02/24/2020 CLINICAL DATA:  2 cm area of indeterminate calcifications in the upper, slightly outer right breast at recent mammography. EXAM: RIGHT BREAST STEREOTACTIC CORE NEEDLE BIOPSY COMPARISON:  Previous exams. FINDINGS: The patient and I discussed the procedure of stereotactic-guided biopsy including benefits and alternatives. We discussed the high likelihood of a successful procedure. We discussed the risks of the procedure including infection, bleeding, tissue injury, clip migration, and inadequate sampling. Informed written consent was given. The usual time out protocol was performed immediately prior to the procedure. Using sterile technique and 1% Lidocaine as local anesthetic, under stereotactic guidance, a 9 gauge vacuum assisted device was used to perform core needle biopsy  of this the recently demonstrated 2 cm group of indeterminate calcifications in the upper, slightly outer right breast using a a cephalad approach. Specimen radiograph was performed showing multiple calcifications in 3 of the specimens. Specimens with calcifications are identified for pathology. Lesion quadrant: Upper outer quadrant At the conclusion of the procedure, a coil shaped tissue marker clip was deployed into the biopsy cavity. Follow-up 2-view mammogram was performed and dictated separately. IMPRESSION: Stereotactic-guided biopsy of the recently demonstrated 2 cm group of indeterminate calcifications in the upper, slightly outer right breast. No apparent complications. Electronically Signed: By: Beckie SaltsSteven  Reid M.D. On: 02/23/2020 11:38    Assessment & Plan:   [name] was seen today for annual exam and hypertension.  Diagnoses and all orders for this visit:  Essential hypertension, benign- Her blood pressure is adequately well controlled.  Electrolytes and renal function  are normal. -     Cancel: CBC with Differential/Platelet; Future -     Cancel: TSH; Future -     Cancel: CBC with Differential/Platelet; Future -     Cancel: Basic metabolic panel; Future -     TSH; Future -     Basic metabolic panel; Future -     CBC with Differential/Platelet; Future  Idiopathic gout, unspecified chronicity, unspecified site- She has achieved her uric acid goal. -     Cancel: Uric acid; Future  Hyperlipidemia with target LDL less than 130- Statin therapy is not indicated. -     Cancel: Hepatic function panel; Future -     Cancel: TSH; Future -     Cancel: Hepatic function panel; Future -     Hepatic function panel; Future  Prediabetes- Improvement noted.  Her A1c is down to 5.7%. 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 -     Cancel: Vitamin B12; Future -     Cancel: Iron; Future -     Cancel: Folate; Future -     Cancel: Ferritin; Future -     Vitamin B1; Future -     Zinc; Future -     VITAMIN D 25 Hydroxy (Vit-D Deficiency, Fractures); Future -     Ferritin; Future -     Folate; Future -     Iron; Future -     Vitamin B12; Future -     Cholecalciferol 1.25 MG (50000 UT) capsule; Take 1 capsule (50,000 Units total) by mouth once a week.  Encounter for general adult medical examination with abnormal findings- Exam completed, labs reviewed - Her ASCVD risk or is less than 10% so I did not recommend a statin, vaccines reviewed and updated, cancer screenings addressed, patient education was given. -     Cancel: Lipid panel; Future -     Cancel: Lipid panel; Future -     Cancel: Uric acid; Future -     Lipid panel; Future -     Uric acid; Future  Colon cancer screening -     Cologuard  Vitamin D deficiency disease -     Cholecalciferol 1.25 MG (50000 UT)  capsule; Take 1 capsule (50,000 Units total) by mouth once a week.   I am having Oanh B. Polich start on Cholecalciferol. I am also having her maintain her allopurinol,  lisinopril, and colchicine.  Meds ordered this encounter  Medications  . Cholecalciferol 1.25 MG (50000 UT) capsule    Sig: Take 1 capsule (50,000 Units total) by mouth once a week.    Dispense:  12 capsule    Refill:  1     Follow-up: Return in about 6 months (around 10/27/2020).  Sanda Linger, MD

## 2020-04-30 ENCOUNTER — Other Ambulatory Visit: Payer: Self-pay | Admitting: Internal Medicine

## 2020-04-30 ENCOUNTER — Telehealth: Payer: Self-pay

## 2020-04-30 NOTE — Telephone Encounter (Signed)
Pt states she is at [facility] & unable to get labs drawn b/c she does not have the proper paperwork.  Labs noted from 04/28/20 & requisitions printed & faxed to [phone] per pt request.

## 2020-05-03 ENCOUNTER — Other Ambulatory Visit: Payer: Self-pay | Admitting: Family

## 2020-05-03 ENCOUNTER — Telehealth (INDEPENDENT_AMBULATORY_CARE_PROVIDER_SITE_OTHER): Payer: Managed Care, Other (non HMO) | Admitting: Family

## 2020-05-03 DIAGNOSIS — J069 Acute upper respiratory infection, unspecified: Secondary | ICD-10-CM

## 2020-05-03 MED ORDER — CHOLECALCIFEROL 1.25 MG (50000 UT) PO CAPS: 50000.0000 [IU] | ORAL_CAPSULE | ORAL | 1 refills | Status: DC

## 2020-05-03 NOTE — Progress Notes (Signed)
Pt called to schedule a COVID test.  Pt states she is unable to come today, appt scheduled for tomorrow at 4p.  Instructions given for arrival & pt verb understanding.

## 2020-05-03 NOTE — Progress Notes (Signed)
Janice Huang is a 49 y.o. female with the following history as recorded in Epic[facility]:  Patient Active Problem List   Diagnosis Date Noted  . Encounter for general adult medical examination with abnormal findings 04/28/2020  . Abnormal electrocardiogram (ECG) (EKG) 07/02/2017  . Screening for cervical cancer 04/17/2017  . Routine general medical examination at a health care facility 10/19/2016  . 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 Prediabetes 05/06/2012  . Gout 05/06/2012  . Morbid obesity (HCC) 06/15/2011  . Essential hypertension, benign 12/16/2010  . Hyperlipidemia with target LDL less than 130 12/16/2010  . Visit for screening mammogram 12/16/2010  . Allergic rhinitis 04/16/2010    Current Outpatient Medications  Medication Sig Dispense Refill  . allopurinol (ZYLOPRIM) 300 MG tablet TAKE 1 TABLET BY MOUTH EVERY DAY 90 tablet 1  . colchicine 0.6 MG tablet TAKE 1 TABLET BY MOUTH 2 TIMES DAILY. 60 tablet 2  . lisinopril (ZESTRIL) 20 MG tablet Take 1 tablet (20 mg total) by mouth daily. 90 tablet 1   No current facility-administered medications for this visit.    Allergies: Codeine and Penicillins  Past Medical History:  Diagnosis Date  . Gout attack    last flare up last week, in toes  . Headache   . History of hiatal hernia    small  . Hyperlipidemia   . Hypertension     Past Surgical History:  Procedure Laterality Date  .  c section x 2    . ABDOMINAL HYSTERECTOMY     1 ovary removed  . LAPAROSCOPIC GASTRIC SLEEVE RESECTION N/A 09/21/2014   Procedure: LAPAROSCOPIC GASTRIC SLEEVE RESECTION WITH HIATAL HERNIA REPAIR;  Surgeon: Luretha Murphy, MD;  Location: [facility];  Service: General;  Laterality: N/A;  . TUBAL LIGATION      Family History  Problem Relation Age of Onset  . Hypertension Other   . Diabetes Other        type 2  . Diabetes Maternal Grandmother   . Hypertension Maternal Grandfather   . Diabetes Paternal Grandmother    . Cancer Neg Hx   . Heart disease Neg Hx   . Hyperlipidemia Neg Hx   . Stroke Neg Hx   . Breast cancer Neg Hx     Social History   Tobacco Use  . Smoking status: Never Smoker  . Smokeless tobacco: Never Used  Substance Use Topics  . Alcohol use: No    Alcohol/week: 0.0 standard drinks    Subjective:   I connected with Janice Huang on 05/03/20 at  2:20 PM EST by a telephone call and verified that I am speaking with the correct person using two identifiers.   I discussed the limitations of evaluation and management by telemedicine and the availability of in person appointments. The patient expressed understanding and agreed to proceed.  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  Lungs: Respirations unlabored;  Neurologic: Alert and oriented; speech intact;  Assessment:  1. Viral URI     Plan:  Patient notes that she is feeling much better in the past 24 hours; discussed COVID testing due to her working with public and agree this  is appropriate; will get this scheduled for her; she is to continue rest and symptomatic treatment in the interim; follow up as needed based on COVID testing and symptoms;   Time spent 8 minutes  No follow-ups on file.  No orders of the defined types were placed in this encounter.   Requested Prescriptions    No prescriptions requested or ordered in this encounter

## 2020-05-05 LAB — VITAMIN D 25 HYDROXY (VIT D DEFICIENCY, FRACTURES): Vit D, 25-Hydroxy: 7.7 ng/mL — ABNORMAL LOW (ref 30.0–100.0)

## 2020-05-05 LAB — BASIC METABOLIC PANEL
BUN/Creatinine Ratio: 13 (ref 9–23)
BUN: 11 mg/dL (ref 6–24)
CO2: 26 mmol/L (ref 20–29)
Calcium: 9 mg/dL (ref 8.7–10.2)
Chloride: 103 mmol/L (ref 96–106)
Creatinine, Ser: 0.82 mg/dL (ref 0.57–1.00)
GFR calc Af Amer: 97 mL/min/{1.73_m2} (ref >59)
GFR calc non Af Amer: 84 mL/min/{1.73_m2} (ref >59)
Glucose: 84 mg/dL (ref 65–99)
Potassium: 4.4 mmol/L (ref 3.5–5.2)
Sodium: 141 mmol/L (ref 134–144)

## 2020-05-05 LAB — CBC WITH DIFFERENTIAL/PLATELET
Basophils Absolute: 0.1 10*3/uL (ref 0.0–0.2)
Basos: 1 %
EOS (ABSOLUTE): 0.1 10*3/uL (ref 0.0–0.4)
Eos: 1 %
Hematocrit: 43.1 % (ref 34.0–46.6)
Hemoglobin: 14.5 g/dL (ref 11.1–15.9)
Immature Grans (Abs): 0 10*3/uL (ref 0.0–0.1)
Immature Granulocytes: 0 %
Lymphocytes Absolute: 3.4 10*3/uL — ABNORMAL HIGH (ref 0.7–3.1)
Lymphs: 43 %
MCH: 29.1 pg (ref 26.6–33.0)
MCHC: 33.6 g/dL (ref 31.5–35.7)
MCV: 86 fL (ref 79–97)
Monocytes Absolute: 0.7 10*3/uL (ref 0.1–0.9)
Monocytes: 8 %
Neutrophils Absolute: 3.6 10*3/uL (ref 1.4–7.0)
Neutrophils: 47 %
Platelets: 230 10*3/uL (ref 150–450)
RBC: 4.99 x10E6/uL (ref 3.77–5.28)
RDW: 13.1 % (ref 11.7–15.4)
WBC: 7.9 10*3/uL (ref 3.4–10.8)

## 2020-05-05 LAB — HEPATIC FUNCTION PANEL
ALT: 20 IU/L (ref 0–32)
AST: 19 IU/L (ref 0–40)
Albumin: 3.9 g/dL (ref 3.8–4.8)
Alkaline Phosphatase: 48 IU/L (ref 44–121)
Bilirubin Total: 0.2 mg/dL (ref 0.0–1.2)
Bilirubin, Direct: <0.1 mg/dL (ref 0.00–0.40)
Total Protein: 6.2 g/dL (ref 6.0–8.5)

## 2020-05-05 LAB — LIPID PANEL
Chol/HDL Ratio: 4.8 ratio — ABNORMAL HIGH (ref 0.0–4.4)
Cholesterol, Total: 257 mg/dL — ABNORMAL HIGH (ref 100–199)
HDL: 53 mg/dL (ref >39)
LDL Chol Calc (NIH): 173 mg/dL — ABNORMAL HIGH (ref 0–99)
Triglycerides: 169 mg/dL — ABNORMAL HIGH (ref 0–149)
VLDL Cholesterol Cal: 31 mg/dL (ref 5–40)

## 2020-05-05 LAB — FOLATE: Folate: 5.9 ng/mL (ref >3.0)

## 2020-05-05 LAB — TSH: TSH: 1.5 u[IU]/mL (ref 0.450–4.500)

## 2020-05-05 LAB — VITAMIN B1: Thiamine: 123.6 nmol/L (ref 66.5–200.0)

## 2020-05-05 LAB — ZINC: Zinc: 71 ug/dL (ref 44–115)

## 2020-05-05 LAB — FERRITIN: Ferritin: 163 ng/mL — ABNORMAL HIGH (ref 15–150)

## 2020-05-05 LAB — URIC ACID: Uric Acid: 6.3 mg/dL — ABNORMAL HIGH (ref 2.6–6.2)

## 2020-05-05 LAB — IRON: Iron: 61 ug/dL (ref 27–159)

## 2020-05-05 LAB — HEMOGLOBIN A1C
Est. average glucose Bld gHb Est-mCnc: 117 mg/dL
Hgb A1c MFr Bld: 5.7 % — ABNORMAL HIGH (ref 4.8–5.6)

## 2020-05-05 LAB — VITAMIN B12: Vitamin B-12: 481 pg/mL (ref 232–1245)

## 2020-05-06 ENCOUNTER — Telehealth: Payer: Self-pay | Admitting: Internal Medicine

## 2020-05-06 LAB — SARS-COV-2, NAA 2 DAY TAT

## 2020-05-06 LAB — NOVEL CORONAVIRUS, NAA: SARS-CoV-2, NAA: NOT DETECTED

## 2020-05-06 NOTE — Telephone Encounter (Signed)
   Patient calling for COVID test results   

## 2020-05-06 NOTE — Telephone Encounter (Signed)
Pt notified that COVID test was negative. Pt denies additional ques/concerns.

## 2020-05-24 ENCOUNTER — Other Ambulatory Visit: Payer: Self-pay | Admitting: Internal Medicine

## 2020-05-24 DIAGNOSIS — I10 Essential (primary) hypertension: Secondary | ICD-10-CM

## 2020-08-27 ENCOUNTER — Other Ambulatory Visit: Payer: Self-pay

## 2020-08-27 ENCOUNTER — Encounter: Payer: Self-pay | Admitting: Internal Medicine

## 2020-08-27 ENCOUNTER — Ambulatory Visit: Payer: Managed Care, Other (non HMO) | Admitting: Internal Medicine

## 2020-08-27 VITALS — BP 126/66 | HR 74 | Temp 98.9°F | Resp 18 | Ht 67.0 in | Wt 282.2 lb

## 2020-08-27 DIAGNOSIS — L989 Disorder of the skin and subcutaneous tissue, unspecified: Secondary | ICD-10-CM | POA: Diagnosis not present

## 2020-08-27 NOTE — Progress Notes (Signed)
   Subjective:   Patient ID: Janice Huang, female    DOB: 1970-11-19, 50 y.o.   MRN: [id number]  HPI The patient is a 50 YO female coming in for several skin lesions. The one on her hip noticed about 6 months ago, dark in color, not expanding in size, denies itching or pain. One on her back noticed about 6 months or so ago, has not been growing hard for her to see, denies itching or pain. Then another lesion on the lips which she denies pain or itching, started about 1-2 weeks ago, used abreva on it and this is helping.   Review of Systems  Constitutional: Negative.   HENT: Negative.   Eyes: Negative.   Respiratory: Negative for cough, chest tightness and shortness of breath.   Cardiovascular: Negative for chest pain, palpitations and leg swelling.  Gastrointestinal: Negative for abdominal distention, abdominal pain, constipation, diarrhea, nausea and vomiting.  Musculoskeletal: Negative.   Skin: Positive for color change.  Neurological: Negative.   Psychiatric/Behavioral: Negative.     Objective:  Physical Exam Constitutional:      Appearance: She is well-developed. She is obese.  HENT:     Head: Normocephalic and atraumatic.  Cardiovascular:     Rate and Rhythm: Normal rate and regular rhythm.  Pulmonary:     Effort: Pulmonary effort is normal. No respiratory distress.     Breath sounds: Normal breath sounds. No wheezing or rales.  Abdominal:     General: Bowel sounds are normal. There is no distension.     Palpations: Abdomen is soft.     Tenderness: There is no abdominal tenderness. There is no rebound.  Musculoskeletal:     Cervical back: Normal range of motion.  Skin:    General: Skin is warm and dry.     Findings: Lesion present.     Comments: Lesion right hip, right scapula dark flat lesion about 3 cm circular without irregular borders  Neurological:     Mental Status: She is alert and oriented to person, place, and time.     Coordination: Coordination  normal.     Vitals:   08/27/20 1505  BP: 126/66  Pulse: 74  Resp: 18  Temp: 98.9 F (37.2 C)  TempSrc: Oral  SpO2: 98%  Weight: 282 lb 3.2 oz (128 kg)  Height: 5\' 7"  (1.702 m)    This visit occurred during the SARS-CoV-2 public health emergency.  Safety protocols were in place, including screening questions prior to the visit, additional usage of staff PPE, and extensive cleaning of exam room while observing appropriate contact time as indicated for disinfecting solutions.   Assessment & Plan:

## 2020-08-27 NOTE — Assessment & Plan Note (Signed)
The lesion on her back is large and warrants dermatology referral for biopsy. Referral is placed. Advised okay to use abreva on lip lesion.

## 2020-08-27 NOTE — Patient Instructions (Signed)
We will get you in with the dermatologist. 

## 2020-09-21 ENCOUNTER — Other Ambulatory Visit: Payer: Self-pay | Admitting: Internal Medicine

## 2020-09-21 DIAGNOSIS — I10 Essential (primary) hypertension: Secondary | ICD-10-CM

## 2020-10-27 ENCOUNTER — Ambulatory Visit: Payer: Managed Care, Other (non HMO) | Admitting: Internal Medicine

## 2020-11-08 ENCOUNTER — Ambulatory Visit: Payer: Managed Care, Other (non HMO) | Admitting: Internal Medicine

## 2020-11-10 ENCOUNTER — Other Ambulatory Visit: Payer: Self-pay

## 2020-11-10 ENCOUNTER — Ambulatory Visit (INDEPENDENT_AMBULATORY_CARE_PROVIDER_SITE_OTHER): Payer: Managed Care, Other (non HMO) | Admitting: Internal Medicine

## 2020-11-10 ENCOUNTER — Encounter: Payer: Self-pay | Admitting: Internal Medicine

## 2020-11-10 VITALS — BP 126/84 | HR 64 | Temp 98.2°F | Ht 67.0 in | Wt 286.0 lb

## 2020-11-10 DIAGNOSIS — I10 Essential (primary) hypertension: Secondary | ICD-10-CM | POA: Diagnosis not present

## 2020-11-10 DIAGNOSIS — M1000 Idiopathic gout, unspecified site: Secondary | ICD-10-CM | POA: Diagnosis not present

## 2020-11-10 DIAGNOSIS — E785 Hyperlipidemia, unspecified: Secondary | ICD-10-CM | POA: Diagnosis not present

## 2020-11-10 DIAGNOSIS — R7303 Prediabetes: Secondary | ICD-10-CM

## 2020-11-10 MED ORDER — ALLOPURINOL 300 MG PO TABS: 300.0000 mg | ORAL_TABLET | Freq: Every day | ORAL | 1 refills | Status: DC

## 2020-11-10 MED ORDER — LISINOPRIL 20 MG PO TABS: 1.0000 | ORAL_TABLET | Freq: Every day | ORAL | 1 refills | Status: DC

## 2020-11-10 MED ORDER — COLCHICINE 0.6 MG PO TABS: 0.6000 mg | ORAL_TABLET | Freq: Two times a day (BID) | ORAL | 2 refills | Status: DC

## 2020-11-10 NOTE — Patient Instructions (Signed)

## 2020-11-10 NOTE — Progress Notes (Signed)
Subjective:  Patient ID: Janice Huang, female    DOB: 16-Nov-1970  Age: 50 y.o. MRN: [id number]  CC: Hypertension  This visit occurred during the SARS-CoV-2 public health emergency.  Safety protocols were in place, including screening questions prior to the visit, additional usage of staff PPE, and extensive cleaning of exam room while observing appropriate contact time as indicated for disinfecting solutions.    HPI Murl Zogg presents for f/up - She is very active and denies any recent episodes of headache, blurred vision, chest pain, shortness of breath, dizziness, or lightheadedness.  She tells me her blood pressure has been well controlled.  Outpatient Medications Prior to Visit  Medication Sig Dispense Refill   Cholecalciferol 1.25 MG (50000 UT) capsule Take 1 capsule (50,000 Units total) by mouth once a week. 12 capsule 1   allopurinol (ZYLOPRIM) 300 MG tablet TAKE 1 TABLET BY MOUTH EVERY DAY 90 tablet 1   colchicine 0.6 MG tablet TAKE 1 TABLET BY MOUTH 2 TIMES DAILY. 60 tablet 2   lisinopril (ZESTRIL) 20 MG tablet TAKE 1 TABLET BY MOUTH EVERY DAY 90 tablet 1   No facility-administered medications prior to visit.    ROS Review of Systems  Constitutional:  Negative for diaphoresis, fatigue and unexpected weight change.  HENT: Negative.    Eyes: Negative.   Respiratory:  Negative for cough, chest tightness, shortness of breath and wheezing.   Cardiovascular:  Negative for chest pain, palpitations and leg swelling.  Gastrointestinal:  Negative for abdominal pain, constipation, diarrhea, nausea and vomiting.  Endocrine: Negative.   Genitourinary: Negative.  Negative for difficulty urinating, dysuria and hematuria.  Musculoskeletal: Negative.  Negative for arthralgias and myalgias.  Skin: Negative.  Negative for rash.  Allergic/Immunologic: Negative.   Neurological: Negative.  Negative for dizziness, weakness, light-headedness and numbness.   Hematological:  Negative for adenopathy. Does not bruise/bleed easily.  Psychiatric/Behavioral: Negative.     Objective:  BP 126/84 (BP Location: Right Arm, Patient Position: Sitting)   Pulse 64   Temp 98.2 F (36.8 C) (Oral)   Ht 5\' 7"  (1.702 m)   Wt 286 lb (129.7 kg)   SpO2 98%   BMI 44.79 kg/m   BP Readings from Last 3 Encounters:  11/10/20 126/84  08/27/20 126/66  04/28/20 128/86    Wt Readings from Last 3 Encounters:  11/10/20 286 lb (129.7 kg)  08/27/20 282 lb 3.2 oz (128 kg)  04/28/20 292 lb (132.5 kg)    Physical Exam Vitals reviewed.  HENT:     Nose: Nose normal.     Mouth/Throat:     Mouth: Mucous membranes are moist.  Eyes:     General: No scleral icterus.    Conjunctiva/sclera: Conjunctivae normal.  Cardiovascular:     Rate and Rhythm: Normal rate and regular rhythm.     Heart sounds: No murmur heard. Pulmonary:     Effort: Pulmonary effort is normal.     Breath sounds: No stridor. No wheezing, rhonchi or rales.  Abdominal:     General: Abdomen is protuberant. Bowel sounds are normal. There is no distension.     Palpations: Abdomen is soft. There is no hepatomegaly, splenomegaly or mass.     Tenderness: There is no abdominal tenderness.  Musculoskeletal:        General: Normal range of motion.     Cervical back: Neck supple.     Right lower leg: No edema.     Left lower leg: No edema.  Lymphadenopathy:  Cervical: No cervical adenopathy.  Skin:    General: Skin is warm and dry.     Coloration: Skin is not pale.  Neurological:     General: No focal deficit present.     Mental Status: She is alert.  Psychiatric:        Mood and Affect: Mood normal.        Behavior: Behavior normal.    Lab Results  Component Value Date   WBC 7.9 04/30/2020   HGB 14.5 04/30/2020   HCT 43.1 04/30/2020   PLT 230 04/30/2020   GLUCOSE 87 11/12/2020   CHOL 240 (H) 11/12/2020   TRIG 125 11/12/2020   HDL 56 11/12/2020   LDLDIRECT 217.2 05/23/2013    LDLCALC 162 (H) 11/12/2020   ALT 20 04/30/2020   AST 19 04/30/2020   NA 139 11/12/2020   K 4.8 11/12/2020   CL 102 11/12/2020   CREATININE 0.91 11/12/2020   BUN 9 11/12/2020   CO2 24 11/12/2020   TSH 1.500 04/30/2020   HGBA1C 5.7 (H) 04/30/2020   MICROALBUR 2.8 (H) 04/14/2015    MM CLIP PLACEMENT RIGHT  Result Date: 02/23/2020 CLINICAL DATA:  Status post stereotactic guided core needle biopsy of a 2 cm group of calcifications in the upper, slightly outer right breast. EXAM: DIAGNOSTIC RIGHT MAMMOGRAM POST STEREOTACTIC BIOPSY COMPARISON:  Previous exam(s). FINDINGS: Mammographic images were obtained following stereotactic guided biopsy of the recently demonstrated 2 cm group of calcifications in the upper, slightly outer right breast. The biopsy marking clip is in expected position at the site of biopsy. IMPRESSION: Appropriate positioning of the coil shaped biopsy marking clip at the site of biopsy in the upper, slightly outer right breast. Final Assessment: Post Procedure Mammograms for Marker Placement Electronically Signed   By: Beckie Salts M.D.   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 This was found to be concordant by Dr. Beckie Salts. Pathology results were discussed with the patient by telephone. The patient reported doing well after the biopsy with tenderness and bleeding at the site. Post biopsy instructions and care were reviewed and questions were answered. The patient was encouraged to call [facility] for any additional concerns. My direct phone number was provided. The patient was instructed to return for annual screening mammography in September 2022. Pathology results reported by Rene Kocher, RN on 02/24/2020.  Electronically Signed   By: Beckie Salts M.D.   On: 02/24/2020 13:20   Result Date: 02/24/2020 CLINICAL DATA:  2 cm area of indeterminate calcifications in the upper, slightly outer right breast at recent mammography. EXAM: RIGHT BREAST STEREOTACTIC CORE NEEDLE BIOPSY COMPARISON:  Previous exams. FINDINGS: The patient and I discussed the procedure of stereotactic-guided biopsy including benefits and alternatives. We discussed the high likelihood of a successful procedure. We discussed the risks of the procedure including infection, bleeding, tissue injury, clip migration, and inadequate sampling. Informed written consent was given. The usual time out protocol was performed immediately prior to the procedure. Using sterile technique and 1% Lidocaine as local anesthetic, under stereotactic guidance, a 9 gauge vacuum assisted device was used to perform core needle biopsy of this the recently demonstrated 2 cm group of indeterminate calcifications in the upper, slightly outer right breast using a a cephalad  approach. Specimen radiograph was performed showing multiple calcifications in 3 of the specimens. Specimens with calcifications are identified for pathology. Lesion quadrant: Upper outer quadrant At the conclusion of the procedure, a coil shaped tissue marker clip was deployed into the biopsy cavity. Follow-up 2-view mammogram was performed and dictated separately. IMPRESSION: Stereotactic-guided biopsy of the recently demonstrated 2 cm group of indeterminate calcifications in the upper, slightly outer right breast. No apparent complications. Electronically Signed: By: Beckie Salts M.D. On: 02/23/2020 11:38    Assessment & Plan:   [place] was seen today for hypertension.  Diagnoses and all orders for this visit:  Prediabetes  Idiopathic gout, unspecified chronicity, unspecified site- She has achieved her uric acid goal.  Will continue the current dose of allopurinol. -     allopurinol (ZYLOPRIM) 300  MG tablet; Take 1 tablet (300 mg total) by mouth daily. -     colchicine 0.6 MG tablet; Take 1 tablet (0.6 mg total) by mouth 2 (two) times daily. -     Cancel: Basic metabolic panel; Future -     Cancel: Uric acid; Future -     Basic metabolic panel; Future -     Uric acid; Future  Gout -     colchicine 0.6 MG tablet; Take 1 tablet (0.6 mg total) by mouth 2 (two) times daily.  Essential hypertension, benign- Her blood pressure is well controlled.  Electrolytes and renal function are normal. -     lisinopril (ZESTRIL) 20 MG tablet; Take 1 tablet (20 mg total) by mouth daily. -     Cancel: Basic metabolic panel; Future -     Basic metabolic panel; Future  Hyperlipidemia with target LDL less than 130- Statin therapy is not indicated. -     Cancel: Lipid panel; Future -     Lipid panel; Future  I have changed Iridian B. Pease's allopurinol, colchicine, and lisinopril. I am also having her maintain her Cholecalciferol.  Meds ordered this encounter  Medications   allopurinol (ZYLOPRIM) 300 MG tablet    Sig: Take 1 tablet (300 mg total) by mouth daily.    Dispense:  90 tablet    Refill:  1   colchicine 0.6 MG tablet    Sig: Take 1 tablet (0.6 mg total) by mouth 2 (two) times daily.    Dispense:  60 tablet    Refill:  2   lisinopril (ZESTRIL) 20 MG tablet    Sig: Take 1 tablet (20 mg total) by mouth daily.    Dispense:  90 tablet    Refill:  1     Follow-up: Return in about 6 months (around 05/12/2021).  Sanda Linger, MD

## 2020-11-12 ENCOUNTER — Other Ambulatory Visit: Payer: Self-pay | Admitting: Internal Medicine

## 2020-11-13 LAB — BASIC METABOLIC PANEL
BUN/Creatinine Ratio: 10 (ref 9–23)
BUN: 9 mg/dL (ref 6–24)
CO2: 24 mmol/L (ref 20–29)
Calcium: 9 mg/dL (ref 8.7–10.2)
Chloride: 102 mmol/L (ref 96–106)
Creatinine, Ser: 0.91 mg/dL (ref 0.57–1.00)
Glucose: 87 mg/dL (ref 65–99)
Potassium: 4.8 mmol/L (ref 3.5–5.2)
Sodium: 139 mmol/L (ref 134–144)
eGFR: 77 mL/min/{1.73_m2} (ref >59)

## 2020-11-13 LAB — URIC ACID: Uric Acid: 6.8 mg/dL — ABNORMAL HIGH (ref 2.6–6.2)

## 2020-11-13 LAB — LIPID PANEL
Chol/HDL Ratio: 4.3 ratio (ref 0.0–4.4)
Cholesterol, Total: 240 mg/dL — ABNORMAL HIGH (ref 100–199)
HDL: 56 mg/dL (ref >39)
LDL Chol Calc (NIH): 162 mg/dL — ABNORMAL HIGH (ref 0–99)
Triglycerides: 125 mg/dL (ref 0–149)
VLDL Cholesterol Cal: 22 mg/dL (ref 5–40)

## 2020-12-10 ENCOUNTER — Ambulatory Visit (INDEPENDENT_AMBULATORY_CARE_PROVIDER_SITE_OTHER): Payer: Managed Care, Other (non HMO) | Admitting: Obstetrics & Gynecology

## 2020-12-10 ENCOUNTER — Other Ambulatory Visit (HOSPITAL_COMMUNITY)
Admission: RE | Admit: 2020-12-10 | Discharge: 2020-12-10 | Disposition: A | Discharge status: Home / Self Care | Payer: Managed Care, Other (non HMO) | Source: Ambulatory Visit | Attending: Obstetrics & Gynecology | Admitting: Obstetrics & Gynecology

## 2020-12-10 ENCOUNTER — Encounter: Payer: Self-pay | Admitting: Obstetrics & Gynecology

## 2020-12-10 ENCOUNTER — Other Ambulatory Visit: Payer: Self-pay

## 2020-12-10 VITALS — BP 112/80 | HR 64 | Resp 16 | Ht 67.25 in | Wt 281.0 lb

## 2020-12-10 DIAGNOSIS — Z90710 Acquired absence of both cervix and uterus: Secondary | ICD-10-CM | POA: Diagnosis not present

## 2020-12-10 DIAGNOSIS — Z1272 Encounter for screening for malignant neoplasm of vagina: Secondary | ICD-10-CM

## 2020-12-10 DIAGNOSIS — Z6841 Body Mass Index (BMI) 40.0 and over, adult: Secondary | ICD-10-CM

## 2020-12-10 DIAGNOSIS — Z01419 Encounter for gynecological examination (general) (routine) without abnormal findings: Secondary | ICD-10-CM

## 2020-12-10 NOTE — Progress Notes (Signed)
Janice Huang 11-28-1970 [id number]   History:    50 y.o.  [id number] Single.  Daughter has a 92 yo daughter and pregnant, due in November.   RP:  Established patient presenting for annual gyn exam   HPI: S/P TAH/LSO for Fibroids in 2010.  No menopausal Sx. no pelvic pain.  No pain with intercourse.  Breasts normal.  Urine and bowel movements normal.  Not exercising regularly currently.  Gastric sleeve surgery 09/2014, BMI improved x last year at 43.68 now.  Health labs with Fam MD.    Past medical history,surgical history, family history and social history were all reviewed and documented in the EPIC chart.  Gynecologic History No LMP recorded. Patient has had a hysterectomy.  Obstetric History OB History  Gravida Para Term Preterm AB Living  4 3     1 3   SAB IAB Ectopic Multiple Live Births  1            # Outcome Date GA Lbr Len/2nd Weight Sex Delivery Anes PTL Lv  4 SAB           3 Para           2 Para           1 Para              ROS: A ROS was performed and pertinent positives and negatives are included in the history.  GENERAL: No fevers or chills. HEENT: No change in vision, no earache, sore throat or sinus congestion. NECK: No pain or stiffness. CARDIOVASCULAR: No chest pain or pressure. No palpitations. PULMONARY: No shortness of breath, cough or wheeze. GASTROINTESTINAL: No abdominal pain, nausea, vomiting or diarrhea, melena or bright red blood per rectum. GENITOURINARY: No urinary frequency, urgency, hesitancy or dysuria. MUSCULOSKELETAL: No joint or muscle pain, no back pain, no recent trauma. DERMATOLOGIC: No rash, no itching, no lesions. ENDOCRINE: No polyuria, polydipsia, no heat or cold intolerance. No recent change in weight. HEMATOLOGICAL: No anemia or easy bruising or bleeding. NEUROLOGIC: No headache, seizures, numbness, tingling or weakness. PSYCHIATRIC: No depression, no loss of interest in normal activity or change in sleep pattern.      Exam:   BP 112/80   Pulse 64   Resp 16   Ht 5' 7.25" (1.708 m)   Wt 281 lb (127.5 kg)   BMI 43.68 kg/m   Body mass index is 43.68 kg/m.  General appearance : Well developed well nourished female. No acute distress HEENT: Eyes: no retinal hemorrhage or exudates,  Neck supple, trachea midline, no carotid bruits, no thyroidmegaly Lungs: Clear to auscultation, no rhonchi or wheezes, or rib retractions  Heart: Regular rate and rhythm, no murmurs or gallops Breast:Examined in sitting and supine position were symmetrical in appearance, no palpable masses or tenderness,  no skin retraction, no nipple inversion, no nipple discharge, no skin discoloration, no axillary or supraclavicular lymphadenopathy Abdomen: no palpable masses or tenderness, no rebound or guarding Extremities: no edema or skin discoloration or tenderness  Pelvic: Vulva: Normal             Vagina: No gross lesions or discharge.  Pap reflex done.  Cervix/Uterus absent  Adnexa  Without masses or tenderness  Anus: Normal   Assessment/Plan:  50 y.o. female for annual exam   1. Encounter for Papanicolaou smear of vagina as part of routine gynecological examination Gynecologic exam status post total hysterectomy.  Pap reflex done on the vaginal vault.  Breast exam  normal.  Screening mammogram last year was abnormal on the right breast, after diagnostic mammogram and ultrasound a biopsy was done which was benign with fibrocystic disease.  Patient will repeat a screening mammogram in September this year.  Schedule colonoscopy.  Health labs with family physician. - Cytology - PAP( [facility])  2. S/P TAH (total abdominal hysterectomy)  3. Class 3 severe obesity due to excess calories with serious comorbidity and body mass index (BMI) of 40.0 to 44.9 in adult [facility])  Patient is post gastric sleeve surgery.  Continue with low calorie/carb diet.  Aerobic activities 5 times a week and light weightlifting every 2  days.  Genia Del MD, 3:24 PM 12/10/2020

## 2020-12-11 ENCOUNTER — Encounter: Payer: Self-pay | Admitting: Obstetrics & Gynecology

## 2020-12-13 LAB — CYTOLOGY - PAP
Chlamydia: NEGATIVE
Comment: NEGATIVE
Comment: NORMAL
Diagnosis: NEGATIVE
Neisseria Gonorrhea: NEGATIVE

## 2020-12-27 ENCOUNTER — Encounter: Payer: Self-pay | Admitting: *Deleted

## 2021-01-31 ENCOUNTER — Other Ambulatory Visit: Payer: Self-pay | Admitting: Internal Medicine

## 2021-01-31 DIAGNOSIS — Z1231 Encounter for screening mammogram for malignant neoplasm of breast: Secondary | ICD-10-CM

## 2021-02-11 ENCOUNTER — Ambulatory Visit: Payer: Managed Care, Other (non HMO) | Admitting: Internal Medicine

## 2021-02-14 ENCOUNTER — Telehealth: Payer: Self-pay

## 2021-02-14 NOTE — Telephone Encounter (Signed)
Please advise as the pt has stated she was billed for a visit on 11/10/2020 that was suppose to be a physical but it states visit was labeled Preventative Care.  *Please call pt at [phone] with what are the next steps that needs to be taken to resolve this billing issue.

## 2021-02-16 NOTE — Telephone Encounter (Signed)
error 

## 2021-02-16 NOTE — Telephone Encounter (Addendum)
Patient called to make [place] aware of her  complaint of not receiving a response  and has been waiting 2 days.     Requesting a callback from office manager  Call back nuber is [phone]

## 2021-03-04 ENCOUNTER — Other Ambulatory Visit: Payer: Self-pay

## 2021-03-04 ENCOUNTER — Ambulatory Visit
Admission: RE | Admit: 2021-03-04 | Discharge: 2021-03-04 | Disposition: A | Discharge status: Home / Self Care | Payer: Managed Care, Other (non HMO) | Source: Ambulatory Visit | Attending: Internal Medicine | Admitting: Internal Medicine

## 2021-03-04 DIAGNOSIS — Z1231 Encounter for screening mammogram for malignant neoplasm of breast: Secondary | ICD-10-CM

## 2021-03-12 ENCOUNTER — Other Ambulatory Visit: Payer: Self-pay | Admitting: Internal Medicine

## 2021-03-12 DIAGNOSIS — I10 Essential (primary) hypertension: Secondary | ICD-10-CM

## 2021-05-17 ENCOUNTER — Telehealth: Payer: Self-pay | Admitting: Internal Medicine

## 2021-05-17 ENCOUNTER — Other Ambulatory Visit: Payer: Self-pay

## 2021-05-17 ENCOUNTER — Encounter: Payer: Self-pay | Admitting: Internal Medicine

## 2021-05-17 ENCOUNTER — Ambulatory Visit: Payer: Managed Care, Other (non HMO) | Admitting: Internal Medicine

## 2021-05-17 VITALS — BP 132/88 | HR 67 | Temp 98.2°F | Resp 16 | Ht 67.25 in | Wt 291.0 lb

## 2021-05-17 DIAGNOSIS — Z9884 Bariatric surgery status: Secondary | ICD-10-CM

## 2021-05-17 DIAGNOSIS — Z1211 Encounter for screening for malignant neoplasm of colon: Secondary | ICD-10-CM | POA: Insufficient documentation

## 2021-05-17 DIAGNOSIS — I10 Essential (primary) hypertension: Secondary | ICD-10-CM

## 2021-05-17 NOTE — Progress Notes (Signed)
Subjective:  Patient ID: Janice Huang, female    DOB: [birth year]  Age: 50 y.o. MRN: [id number]  CC: Hypertension  This visit occurred during the SARS-CoV-2 public health emergency.  Safety protocols were in place, including screening questions prior to the visit, additional usage of staff PPE, and extensive cleaning of exam room while observing appropriate contact time as indicated for disinfecting solutions.    HPI [facility] presents for f/up -  She is active and denies CP, DOE, SOB, edema, fatigue.  Outpatient Medications Prior to Visit  Medication Sig Dispense Refill   allopurinol (ZYLOPRIM) 300 MG tablet Take 1 tablet (300 mg total) by mouth daily. 90 tablet 1   Cholecalciferol 1.25 MG (50000 UT) capsule Take 1 capsule (50,000 Units total) by mouth once a week. 12 capsule 1   colchicine 0.6 MG tablet Take 1 tablet (0.6 mg total) by mouth 2 (two) times daily. 60 tablet 2   lisinopril (ZESTRIL) 20 MG tablet TAKE 1 TABLET BY MOUTH EVERY DAY 90 tablet 1   No facility-administered medications prior to visit.    ROS Review of Systems  Constitutional:  Positive for unexpected weight change (wt gain). Negative for appetite change, chills, diaphoresis and fatigue.  Eyes: Negative.   Respiratory:  Negative for cough, chest tightness, shortness of breath and wheezing.   Cardiovascular:  Negative for chest pain, palpitations and leg swelling.  Gastrointestinal:  Negative for abdominal pain, constipation, diarrhea, nausea and vomiting.  Endocrine: Negative.   Genitourinary: Negative.  Negative for difficulty urinating.  Musculoskeletal: Negative.   Skin: Negative.  Negative for color change.  Hematological:  Negative for adenopathy. Does not bruise/bleed easily.  Psychiatric/Behavioral: Negative.     Objective:  BP 132/88 (BP Location: Right Arm, Patient Position: Sitting, Cuff Size: Large)    Pulse 67    Temp 98.2 F (36.8 C) (Oral)    Resp 16    Ht 5'  7.25" (1.708 m)    Wt 291 lb (132 kg)    SpO2 97%    BMI 45.24 kg/m   BP Readings from Last 3 Encounters:  05/17/21 132/88  12/10/20 112/80  11/10/20 126/84    Wt Readings from Last 3 Encounters:  05/17/21 291 lb (132 kg)  12/10/20 281 lb (127.5 kg)  11/10/20 286 lb (129.7 kg)    Physical Exam Vitals reviewed.  Constitutional:      Appearance: She is obese. She is not ill-appearing.  HENT:     Nose: Nose normal.     Mouth/Throat:     Mouth: Mucous membranes are moist.  Eyes:     General: No scleral icterus.    Conjunctiva/sclera: Conjunctivae normal.  Cardiovascular:     Rate and Rhythm: Normal rate and regular rhythm.     Heart sounds: No murmur heard. Pulmonary:     Effort: Pulmonary effort is normal.     Breath sounds: No stridor. No wheezing, rhonchi or rales.  Abdominal:     General: Abdomen is flat.     Palpations: There is no mass.     Tenderness: There is no abdominal tenderness. There is no guarding.     Hernia: No hernia is present.  Musculoskeletal:     Cervical back: Neck supple.  Lymphadenopathy:     Cervical: No cervical adenopathy.  Skin:    General: Skin is warm and dry.  Neurological:     General: No focal deficit present.     Mental Status: She is alert. Mental  status is at baseline.  Psychiatric:        Mood and Affect: Mood normal.        Behavior: Behavior normal.    Lab Results  Component Value Date   WBC 7.9 04/30/2020   HGB 14.5 04/30/2020   HCT 43.1 04/30/2020   PLT 230 04/30/2020   GLUCOSE 87 11/12/2020   CHOL 240 (H) 11/12/2020   TRIG 125 11/12/2020   HDL 56 11/12/2020   LDLDIRECT 217.2 05/23/2013   LDLCALC 162 (H) 11/12/2020   ALT 20 04/30/2020   AST 19 04/30/2020   NA 139 11/12/2020   K 4.8 11/12/2020   CL 102 11/12/2020   CREATININE 0.91 11/12/2020   BUN 9 11/12/2020   CO2 24 11/12/2020   TSH 1.500 04/30/2020   HGBA1C 5.7 (H) 04/30/2020   MICROALBUR 2.8 (H) 04/14/2015    MM 3D SCREEN BREAST BILATERAL  Result  Date: 03/10/2021 CLINICAL DATA:  Screening. EXAM: DIGITAL SCREENING BILATERAL MAMMOGRAM WITH TOMOSYNTHESIS AND CAD TECHNIQUE: Bilateral screening digital craniocaudal and mediolateral oblique mammograms were obtained. Bilateral screening digital breast tomosynthesis was performed. The images were evaluated with computer-aided detection. COMPARISON:  Previous exam(s). ACR Breast Density Category b: There are scattered areas of fibroglandular density. FINDINGS: There are no findings suspicious for malignancy. IMPRESSION: No mammographic evidence of malignancy. A result letter of this screening mammogram will be mailed directly to the patient. RECOMMENDATION: Screening mammogram in one year. (Code:SM-B-01Y) BI-RADS CATEGORY  1: Negative. Electronically Signed   By: Bary Richard M.D.   On: 03/10/2021 10:18    Assessment & Plan:   Janice Huang was seen today for hypertension.  Diagnoses and all orders for this visit:  Essential hypertension, benign- Her BP is well controlled. 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 -     Vitamin B12; Future -     IBC + Ferritin; Future -     Vitamin B1; Future -     Folate; Future  Colon cancer screening -     Cologuard  Morbid obesity (HCC)- Will screen for DM2 -     Hemoglobin A1c; Future -     Hepatic function panel; Future   I am having Janice Huang maintain her Cholecalciferol, allopurinol, colchicine, and lisinopril.  No orders of the defined types were placed in this encounter.    Follow-up: Return in about 6 months (around 11/15/2021).  Sanda Linger, MD

## 2021-05-17 NOTE — Telephone Encounter (Signed)
Patient states she requested labs done through her employer, [facility] due to no out of pocket expenses  Patient states [facility] has not received order for labs  Please advise

## 2021-05-17 NOTE — Patient Instructions (Signed)

## 2021-05-27 NOTE — Telephone Encounter (Signed)
Patient calling in again  Patient called in on the 27th about having the lab orders being sent to [facility]  Patient upset bc she says she has called several times about this & nothing has been done  Please fax lab orders to [facility] [phone] & let patient know when the orders have been sent.. patient says she does not want to keep wasting her time going to lab to have them done but we have not sent orders  Call patient [phone]

## 2021-05-27 NOTE — Telephone Encounter (Signed)
Called pt, LVM to discuss.  

## 2021-05-28 ENCOUNTER — Encounter: Payer: Self-pay | Admitting: Internal Medicine

## 2021-05-28 NOTE — Addendum Note (Signed)
Addended by: Etta Grandchild on: 05/28/2021 10:59 AM   Modules accepted: Orders

## 2021-06-03 ENCOUNTER — Other Ambulatory Visit: Payer: Self-pay | Admitting: Internal Medicine

## 2021-06-06 LAB — CBC WITH DIFFERENTIAL/PLATELET
Basophils Absolute: 0 10*3/uL (ref 0.0–0.2)
Basos: 0 %
EOS (ABSOLUTE): 0.1 10*3/uL (ref 0.0–0.4)
Eos: 1 %
Hematocrit: 40.2 % (ref 34.0–46.6)
Hemoglobin: 13.3 g/dL (ref 11.1–15.9)
Immature Grans (Abs): 0 10*3/uL (ref 0.0–0.1)
Immature Granulocytes: 0 %
Lymphocytes Absolute: 2.9 10*3/uL (ref 0.7–3.1)
Lymphs: 38 %
MCH: 28.1 pg (ref 26.6–33.0)
MCHC: 33.1 g/dL (ref 31.5–35.7)
MCV: 85 fL (ref 79–97)
Monocytes Absolute: 0.8 10*3/uL (ref 0.1–0.9)
Monocytes: 10 %
Neutrophils Absolute: 4 10*3/uL (ref 1.4–7.0)
Neutrophils: 51 %
Platelets: 218 10*3/uL (ref 150–450)
RBC: 4.73 x10E6/uL (ref 3.77–5.28)
RDW: 13 % (ref 11.7–15.4)
WBC: 7.8 10*3/uL (ref 3.4–10.8)

## 2021-06-06 LAB — BASIC METABOLIC PANEL
BUN/Creatinine Ratio: 13 (ref 9–23)
BUN: 12 mg/dL (ref 6–24)
CO2: 25 mmol/L (ref 20–29)
Calcium: 8.8 mg/dL (ref 8.7–10.2)
Chloride: 102 mmol/L (ref 96–106)
Creatinine, Ser: 0.9 mg/dL (ref 0.57–1.00)
Glucose: 86 mg/dL (ref 70–99)
Potassium: 4.2 mmol/L (ref 3.5–5.2)
Sodium: 138 mmol/L (ref 134–144)
eGFR: 78 mL/min/{1.73_m2} (ref >59)

## 2021-06-06 LAB — VITAMIN B1: Thiamine: 121.9 nmol/L (ref 66.5–200.0)

## 2021-06-06 LAB — HEPATIC FUNCTION PANEL
ALT: 16 IU/L (ref 0–32)
AST: 18 IU/L (ref 0–40)
Albumin: 3.7 g/dL — ABNORMAL LOW (ref 3.8–4.8)
Alkaline Phosphatase: 46 IU/L (ref 44–121)
Bilirubin Total: <0.2 mg/dL (ref 0.0–1.2)
Bilirubin, Direct: <0.1 mg/dL (ref 0.00–0.40)
Total Protein: 5.6 g/dL — ABNORMAL LOW (ref 6.0–8.5)

## 2021-06-06 LAB — IRON AND TIBC
Iron Saturation: 13 % — ABNORMAL LOW (ref 15–55)
Iron: 41 ug/dL (ref 27–159)
Total Iron Binding Capacity: 312 ug/dL (ref 250–450)
UIBC: 271 ug/dL (ref 131–425)

## 2021-06-06 LAB — HGB A1C W/O EAG: Hgb A1c MFr Bld: 6 % — ABNORMAL HIGH (ref 4.8–5.6)

## 2021-06-06 LAB — VITAMIN B12: Vitamin B-12: 383 pg/mL (ref 232–1245)

## 2021-06-06 LAB — FOLATE: Folate: 4.2 ng/mL (ref >3.0)

## 2021-06-06 LAB — TSH: TSH: 1.52 u[IU]/mL (ref 0.450–4.500)

## 2021-06-06 LAB — FERRITIN: Ferritin: 83 ng/mL (ref 15–150)

## 2021-08-31 ENCOUNTER — Ambulatory Visit: Payer: Managed Care, Other (non HMO) | Admitting: Radiology

## 2021-08-31 ENCOUNTER — Encounter: Payer: Self-pay | Admitting: Radiology

## 2021-08-31 VITALS — BP 136/98

## 2021-08-31 DIAGNOSIS — Z113 Encounter for screening for infections with a predominantly sexual mode of transmission: Secondary | ICD-10-CM

## 2021-08-31 NOTE — Progress Notes (Signed)
? ? ? ? ?  Subjective: Janice Huang is a 51 y.o. female here for STI screening. Her partner would like to stop using condoms. He was tested and was negative. She donated plasma and is tested for HIV [facility]PR HEP regularly there. ? ? ? ?Review of Systems 9point negative ? ?Objective: ? ?-Vulva: without lesions or discharge ?-Vagina: scant discharge present, aptima swab  ?-Cervix: absent ?-Perineum: no lesions ?-Uterus: absent ?-Adnexa: absent ? ? ?Chaperone offered and declined. ? ?Assessment:/Plan:  ? ?1. Screening examination for STD (sexually transmitted disease) ? ?- SURESWAB CT/NG/T. vaginalis ?  ? ?Will contact patient with results of testing completed today. Safe sex encouraged.  ?

## 2021-09-01 LAB — SURESWAB CT/NG/T. VAGINALIS
C. trachomatis RNA, TMA: NOT DETECTED
N. gonorrhoeae RNA, TMA: NOT DETECTED
Trichomonas vaginalis RNA: NOT DETECTED

## 2021-09-25 ENCOUNTER — Other Ambulatory Visit: Payer: Self-pay | Admitting: Internal Medicine

## 2021-09-25 DIAGNOSIS — I10 Essential (primary) hypertension: Secondary | ICD-10-CM

## 2021-11-16 ENCOUNTER — Encounter: Payer: Self-pay | Admitting: Internal Medicine

## 2021-11-16 ENCOUNTER — Ambulatory Visit (INDEPENDENT_AMBULATORY_CARE_PROVIDER_SITE_OTHER): Payer: Managed Care, Other (non HMO)

## 2021-11-16 ENCOUNTER — Ambulatory Visit: Payer: Managed Care, Other (non HMO) | Admitting: Internal Medicine

## 2021-11-16 VITALS — BP 134/86 | HR 68 | Temp 98.0°F | Ht 67.25 in | Wt 286.0 lb

## 2021-11-16 DIAGNOSIS — R053 Chronic cough: Secondary | ICD-10-CM

## 2021-11-16 DIAGNOSIS — I10 Essential (primary) hypertension: Secondary | ICD-10-CM | POA: Diagnosis not present

## 2021-11-16 DIAGNOSIS — Z23 Encounter for immunization: Secondary | ICD-10-CM

## 2021-11-16 MED ORDER — INDAPAMIDE 1.25 MG PO TABS: 1.2500 mg | ORAL_TABLET | Freq: Every day | ORAL | 0 refills | Status: DC

## 2021-11-16 MED ORDER — SHINGRIX 50 MCG/0.5ML IM SUSR: 0.5000 mL | Freq: Once | INTRAMUSCULAR | 1 refills | Status: AC

## 2021-11-16 NOTE — Patient Instructions (Signed)

## 2021-11-16 NOTE — Progress Notes (Signed)
Subjective:  Patient ID: Janice Huang, female    DOB: Oct 26, 1970  Age: 51 y.o. MRN: [id number]  CC: Hypertension and Cough   HPI Janice Huang presents for f/up -  She complains of a 66-month history of nonproductive cough that is worse at night.  Outpatient Medications Prior to Visit  Medication Sig Dispense Refill   allopurinol (ZYLOPRIM) 300 MG tablet Take 1 tablet (300 mg total) by mouth daily. 90 tablet 1   Cholecalciferol 1.25 MG (50000 UT) capsule Take 1 capsule (50,000 Units total) by mouth once a week. 12 capsule 1   colchicine 0.6 MG tablet Take 1 tablet (0.6 mg total) by mouth 2 (two) times daily. 60 tablet 2   lisinopril (ZESTRIL) 20 MG tablet TAKE 1 TABLET BY MOUTH EVERY DAY 90 tablet 1   No facility-administered medications prior to visit.    ROS Review of Systems  Constitutional: Negative.  Negative for chills, diaphoresis and fever.  HENT: Negative.    Eyes: Negative.   Respiratory:  Positive for cough. Negative for chest tightness, shortness of breath and wheezing.   Cardiovascular:  Negative for chest pain, palpitations and leg swelling.  Gastrointestinal:  Negative for abdominal pain, diarrhea, nausea and vomiting.  Endocrine: Negative.   Genitourinary: Negative.  Negative for difficulty urinating.  Musculoskeletal: Negative.   Skin: Negative.   Neurological:  Negative for dizziness, weakness, light-headedness and headaches.  Hematological:  Negative for adenopathy. Does not bruise/bleed easily.  Psychiatric/Behavioral: Negative.      Objective:  BP 134/86 (BP Location: Left Arm, Patient Position: Sitting, Cuff Size: Large)   Pulse 68   Temp 98 F (36.7 C) (Oral)   Ht 5' 7.25" (1.708 m)   Wt 286 lb (129.7 kg)   SpO2 97%   BMI 44.46 kg/m   BP Readings from Last 3 Encounters:  11/16/21 134/86  08/31/21 (!) 136/98  05/17/21 132/88    Wt Readings from Last 3 Encounters:  11/16/21 286 lb (129.7 kg)  05/17/21 291 lb  (132 kg)  12/10/20 281 lb (127.5 kg)    Physical Exam Constitutional:      Appearance: She is obese.  HENT:     Nose: Nose normal.     Mouth/Throat:     Mouth: Mucous membranes are moist.  Eyes:     General: No scleral icterus.    Conjunctiva/sclera: Conjunctivae normal.  Cardiovascular:     Rate and Rhythm: Normal rate and regular rhythm.     Heart sounds: No murmur heard. Pulmonary:     Effort: Pulmonary effort is normal.     Breath sounds: No stridor. No wheezing, rhonchi or rales.  Abdominal:     General: Abdomen is protuberant.     Palpations: There is no hepatomegaly, splenomegaly or mass.     Tenderness: There is no abdominal tenderness. There is no guarding.     Hernia: No hernia is present.  Musculoskeletal:        General: Normal range of motion.     Cervical back: Neck supple.     Right lower leg: No edema.     Left lower leg: No edema.  Lymphadenopathy:     Cervical: No cervical adenopathy.  Skin:    General: Skin is warm and dry.  Neurological:     Mental Status: She is alert.     Lab Results  Component Value Date   WBC 7.8 06/03/2021   HGB 13.3 06/03/2021   HCT 40.2 06/03/2021   PLT  218 06/03/2021   GLUCOSE 86 06/03/2021   CHOL 240 (H) 11/12/2020   TRIG 125 11/12/2020   HDL 56 11/12/2020   LDLDIRECT 217.2 05/23/2013   LDLCALC 162 (H) 11/12/2020   ALT 16 06/03/2021   AST 18 06/03/2021   NA 138 06/03/2021   K 4.2 06/03/2021   CL 102 06/03/2021   CREATININE 0.90 06/03/2021   BUN 12 06/03/2021   CO2 25 06/03/2021   TSH 1.520 06/03/2021   HGBA1C 6.0 (H) 06/03/2021   MICROALBUR 2.8 (H) 04/14/2015    MM 3D SCREEN BREAST BILATERAL  Result Date: 03/10/2021 CLINICAL DATA:  Screening. EXAM: DIGITAL SCREENING BILATERAL MAMMOGRAM WITH TOMOSYNTHESIS AND CAD TECHNIQUE: Bilateral screening digital craniocaudal and mediolateral oblique mammograms were obtained. Bilateral screening digital breast tomosynthesis was performed. The images were evaluated  with computer-aided detection. COMPARISON:  Previous exam(s). ACR Breast Density Category b: There are scattered areas of fibroglandular density. FINDINGS: There are no findings suspicious for malignancy. IMPRESSION: No mammographic evidence of malignancy. A result letter of this screening mammogram will be mailed directly to the patient. RECOMMENDATION: Screening mammogram in one year. (Code:SM-B-01Y) BI-RADS CATEGORY  1: Negative. Electronically Signed   By: Bary Richard M.D.   On: 03/10/2021 10:18   DG Chest 2 View  Result Date: 11/17/2021 CLINICAL DATA:  Nonproductive cough for 3 months. EXAM: CHEST - 2 VIEW COMPARISON:  06/22/2017 FINDINGS: The heart size and mediastinal contours are within normal limits. Both lungs are clear. The visualized skeletal structures are unremarkable. IMPRESSION: No active cardiopulmonary disease. Electronically Signed   By: Danae Orleans M.D.   On: 11/17/2021 12:56     Assessment & Plan:   Janice Huang was seen today for hypertension and cough.  Diagnoses and all orders for this visit:  Essential hypertension, benign- Will discontinue the ACE inhibitor and start a thiazide diuretic. -     indapamide (LOZOL) 1.25 MG tablet; Take 1 tablet (1.25 mg total) by mouth daily.  Chronic cough- Her chest x-ray is normal.  I think the cough is due to the ACE inhibitor. -     DG Chest 2 View; Future  Need for prophylactic vaccination and inoculation against varicella -     Zoster Vaccine Adjuvanted [facility]) injection; Inject 0.5 mLs into the muscle once for 1 dose.   I have discontinued Janice Huang's lisinopril. I am also having her start on Shingrix and indapamide. Additionally, I am having her maintain her Cholecalciferol, allopurinol, and colchicine.  Meds ordered this encounter  Medications   Zoster Vaccine Adjuvanted [facility]) injection    Sig: Inject 0.5 mLs into the muscle once for 1 dose.    Dispense:  0.5 mL    Refill:  1   indapamide (LOZOL) 1.25  MG tablet    Sig: Take 1 tablet (1.25 mg total) by mouth daily.    Dispense:  90 tablet    Refill:  0     Follow-up: Return in about 3 months (around 02/16/2022).  Sanda Linger, MD

## 2021-12-02 ENCOUNTER — Telehealth: Payer: Self-pay | Admitting: Internal Medicine

## 2021-12-02 ENCOUNTER — Other Ambulatory Visit: Payer: Self-pay | Admitting: Internal Medicine

## 2021-12-02 DIAGNOSIS — E559 Vitamin D deficiency, unspecified: Secondary | ICD-10-CM

## 2021-12-02 DIAGNOSIS — Z9884 Bariatric surgery status: Secondary | ICD-10-CM

## 2021-12-02 MED ORDER — CHOLECALCIFEROL 1.25 MG (50000 UT) PO CAPS: 50000.0000 [IU] | ORAL_CAPSULE | ORAL | 0 refills | Status: DC

## 2021-12-02 NOTE — Telephone Encounter (Signed)
Caller & Relationship to patient:   Call back number:   Date of last office visit:11/16/2021   Date of next office visit:   Medication(s) to be refilled:Cholecalciferol 1.25 MG (50000 UT) capsule        Preferred Pharmacy:  [id number] [place], [place] - [address] Phone:  [phone]  Fax:  [phone]

## 2021-12-14 ENCOUNTER — Encounter: Payer: Self-pay | Admitting: Obstetrics & Gynecology

## 2021-12-14 ENCOUNTER — Ambulatory Visit (INDEPENDENT_AMBULATORY_CARE_PROVIDER_SITE_OTHER): Payer: Managed Care, Other (non HMO) | Admitting: Obstetrics & Gynecology

## 2021-12-14 VITALS — BP 114/70 | HR 76 | Ht 66.75 in | Wt 285.0 lb

## 2021-12-14 DIAGNOSIS — E66813 Obesity, class 3: Secondary | ICD-10-CM

## 2021-12-14 DIAGNOSIS — Z01419 Encounter for gynecological examination (general) (routine) without abnormal findings: Secondary | ICD-10-CM

## 2021-12-14 DIAGNOSIS — Z6841 Body Mass Index (BMI) 40.0 and over, adult: Secondary | ICD-10-CM | POA: Diagnosis not present

## 2021-12-14 DIAGNOSIS — Z90710 Acquired absence of both cervix and uterus: Secondary | ICD-10-CM

## 2021-12-14 NOTE — Progress Notes (Signed)
Janice Huang 01-21-1971 [id number]   History:    51 y.o. [id number] Single.  Daughter has a 74 yo daughter and a 29+ month old.   RP:  Established patient presenting for annual gyn exam   HPI: S/P TAH/LSO for Fibroids in 2010.  No menopausal Sx. no pelvic pain.  No pain with intercourse. Pap Neg 11/2020.  Will repeat a Pap at 3-5 years.  Breasts normal.  Mammo Neg 02/2021.  Urine and bowel movements normal.  Not exercising regularly currently. Gastric sleeve surgery 09/2014, BMI at 44.97 now.  Has the ColoGuard test at home, ready to do.  Health labs with Fam MD.    Past medical history,surgical history, family history and social history were all reviewed and documented in the EPIC chart.  Gynecologic History No LMP recorded. Patient has had a hysterectomy.  Obstetric History OB History  Gravida Para Term Preterm AB Living  4 3 2 1 1 3   SAB IAB Ectopic Multiple Live Births  1            # Outcome Date GA Lbr Len/2nd Weight Sex Delivery Anes PTL Lv  4 SAB           3 Preterm           2 Term           1 Term              ROS: A ROS was performed and pertinent positives and negatives are included in the history.  GENERAL: No fevers or chills. HEENT: No change in vision, no earache, sore throat or sinus congestion. NECK: No pain or stiffness. CARDIOVASCULAR: No chest pain or pressure. No palpitations. PULMONARY: No shortness of breath, cough or wheeze. GASTROINTESTINAL: No abdominal pain, nausea, vomiting or diarrhea, melena or bright red blood per rectum. GENITOURINARY: No urinary frequency, urgency, hesitancy or dysuria. MUSCULOSKELETAL: No joint or muscle pain, no back pain, no recent trauma. DERMATOLOGIC: No rash, no itching, no lesions. ENDOCRINE: No polyuria, polydipsia, no heat or cold intolerance. No recent change in weight. HEMATOLOGICAL: No anemia or easy bruising or bleeding. NEUROLOGIC: No headache, seizures, numbness, tingling or weakness. PSYCHIATRIC: No  depression, no loss of interest in normal activity or change in sleep pattern.     Exam:   BP 114/70   Pulse 76   Ht 5' 6.75" (1.695 m)   Wt 285 lb (129.3 kg)   SpO2 98%   BMI 44.97 kg/m   Body mass index is 44.97 kg/m.  General appearance : Well developed well nourished female. No acute distress HEENT: Eyes: no retinal hemorrhage or exudates,  Neck supple, trachea midline, no carotid bruits, no thyroidmegaly Lungs: Clear to auscultation, no rhonchi or wheezes, or rib retractions  Heart: Regular rate and rhythm, no murmurs or gallops Breast:Examined in sitting and supine position were symmetrical in appearance, no palpable masses or tenderness,  no skin retraction, no nipple inversion, no nipple discharge, no skin discoloration, no axillary or supraclavicular lymphadenopathy Abdomen: no palpable masses or tenderness, no rebound or guarding Extremities: no edema or skin discoloration or tenderness  Pelvic: Vulva: Normal             Vagina: No gross lesions or discharge  Cervix/Uterus absent  Adnexa  Without masses or tenderness  Anus: Normal   Assessment/Plan:  51 y.o. female for annual exam   1. Well female exam with routine gynecological exam S/P TAH/LSO for Fibroids in 2010.  No menopausal  Sx. no pelvic pain.  No pain with intercourse. Pap Neg 11/2020.  Will repeat a Pap at 3-5 years.  Breasts normal.  Mammo Neg 02/2021.  Urine and bowel movements normal.  Not exercising regularly currently. Gastric sleeve surgery 09/2014, BMI at 44.97 now.  Has the ColoGuard test at home, ready to do.  Health labs with Fam MD.   2. S/P TAH (total abdominal hysterectomy) with LSO  3. Class 3 severe obesity due to excess calories with serious comorbidity and body mass index (BMI) of 40.0 to 44.9 in adult [facility])  Not exercising regularly currently. Encouraged to increase fitness activities.  Low calorie/carb diet.  Gastric sleeve surgery 09/2014, BMI at 44.97 now.   Genia Del MD, 2:16 PM  12/14/2021

## 2022-01-11 ENCOUNTER — Other Ambulatory Visit: Payer: Self-pay | Admitting: Internal Medicine

## 2022-01-11 DIAGNOSIS — I10 Essential (primary) hypertension: Secondary | ICD-10-CM

## 2022-01-18 ENCOUNTER — Other Ambulatory Visit: Payer: Self-pay | Admitting: Internal Medicine

## 2022-01-18 DIAGNOSIS — Z1231 Encounter for screening mammogram for malignant neoplasm of breast: Secondary | ICD-10-CM

## 2022-02-01 ENCOUNTER — Ambulatory Visit: Payer: Managed Care, Other (non HMO) | Admitting: Family Medicine

## 2022-02-01 ENCOUNTER — Encounter: Payer: Self-pay | Admitting: Family Medicine

## 2022-02-01 VITALS — BP 136/96 | HR 70 | Temp 97.6°F | Ht 66.75 in | Wt 291.0 lb

## 2022-02-01 DIAGNOSIS — B372 Candidiasis of skin and nail: Secondary | ICD-10-CM

## 2022-02-01 MED ORDER — TERBINAFINE HCL 1 % EX CREA: 1.0000 | TOPICAL_CREAM | Freq: Two times a day (BID) | CUTANEOUS | 0 refills | Status: DC

## 2022-02-01 NOTE — Patient Instructions (Signed)
Keep the area clean and dry.   Do not use peroxide, neosporin or any other topical medications other than the Lamisil I prescribed.   Follow up in 2 -3 weeks to make sure the area is improving.       Intertrigo Intertrigo is skin irritation (inflammation) that happens in warm, moist areas of the body. The irritation can cause a rash and make skin raw and itchy. The rash is usually pink or red. It happens mostly between folds of skin or where skin rubs together, such as: Between the toes. In the armpits. In the groin area. Under the belly. Under the breasts. Around the butt area. This condition is not passed from person to person (is not contagious). What are the causes? Heat, moisture, rubbing, and not enough air movement. The condition can be made worse by: Sweat. Bacteria. A fungus, such as yeast. What increases the risk? Moisture in your skin folds. You are more likely to develop this condition if you: Have diabetes. Are overweight. Are not able to move around. Live in a warm and moist climate. Wear splints, braces, or other medical devices. Are not able to control your pee (urine) or poop (stool). What are the signs or symptoms? A pink or red skin rash in the skin fold or near the skin fold. Raw or scaly skin. Itching. A burning feeling. Bleeding. Leaking fluid. A bad smell. How is this treated? Cleaning and drying your skin. Taking an antibiotic medicine or using an antibiotic skin cream for a bacterial infection. Using an antifungal cream on your skin or taking pills for an infection that was caused by a fungus, such as yeast. Using a steroid ointment to stop the itching and irritation. Separating the skin fold with a clean cotton cloth to absorb moisture and allow air to flow into the area. Follow these instructions at home: Keep the affected area clean and dry. Do not scratch your skin. Stay cool as much as you can. Use an air conditioner or a fan, if you  have one. Apply over-the-counter and prescription medicines only as told by your doctor. If you were prescribed an antibiotic medicine, use it as told by your doctor. Do not stop using the antibiotic even if your condition starts to get better. Keep all follow-up visits as told by your doctor. This is important. How is this prevented?  Stay at a healthy weight. Take care of your feet. This is very important if you have diabetes. You should: Wear shoes that fit well. Keep your feet dry. Wear clean cotton or wool socks. Protect the skin in your groin and butt area as told by your doctor. To do this: Follow a regular cleaning routine. Use creams, powders, or ointments that protect your skin. Change protection pads often. Do not wear tight clothes. Wear clothes that: Are loose. Take moisture away from your body. Are made of cotton. Wear a bra that gives good support, if needed. Shower and dry yourself well after being active. Use a hair dryer on a cool setting to dry between skin folds. Keep your blood sugar under control if you have diabetes. Contact a doctor if: Your symptoms do not get better with treatment. Your symptoms get worse or they spread. You notice more redness and warmth. You have a fever. Summary Intertrigo is skin irritation that occurs when folds of skin rub together. This condition is caused by heat, moisture, and rubbing. This condition may be treated by cleaning and drying your skin and  with medicines. Apply over-the-counter and prescription medicines only as told by your doctor. Keep all follow-up visits as told by your doctor. This is important. This information is not intended to replace advice given to you by your health care provider. Make sure you discuss any questions you have with your health care provider. Document Revised: 07/20/2021 Document Reviewed: 02/21/2021 Elsevier Patient Education  2023 [employer].

## 2022-02-01 NOTE — Progress Notes (Signed)
Subjective:     Patient ID: Janice Huang, female    DOB: 04-30-1971, 51 y.o.   MRN: [id number]  Chief Complaint  Patient presents with   wound    Wound in lower right stomach area that keeps coming back for the last 6 months on and off. Using peroxide and neosporin as well as trying to keep it dry.     HPI Patient is in today for a 2 wk hx of right lower abdominal issue. No pain or itching. Skin is moist and discolored.  Intermittent issue for the past 6 months.  Hx of gastric surgery.  No fever, chills, N/V/D.    Health Maintenance Due  Topic Date Due   COLONOSCOPY (Pts 45-65yrs Insurance coverage will need to be confirmed)  Never done   Diabetic kidney evaluation - Urine ACR  04/13/2016   OPHTHALMOLOGY EXAM  05/10/2017   FOOT EXAM  07/24/2019   Zoster Vaccines- Shingrix (1 of 2) Never done   HEMOGLOBIN A1C  12/01/2021    Past Medical History:  Diagnosis Date   Gout attack    last flare up last week, in toes   Headache    History of hiatal hernia    small   Hyperlipidemia    Hypertension     Past Surgical History:  Procedure Laterality Date    c section x 2     ABDOMINAL HYSTERECTOMY     1 ovary removed   BREAST BIOPSY Right 02/23/2020   fibroadenoma   LAPAROSCOPIC GASTRIC SLEEVE RESECTION N/A 09/21/2014   Procedure: LAPAROSCOPIC GASTRIC SLEEVE RESECTION WITH HIATAL HERNIA REPAIR;  Surgeon: Luretha Murphy, MD;  Location: [facility];  Service: General;  Laterality: N/A;   TUBAL LIGATION      Family History  Problem Relation Age of Onset   Hypertension Other    Diabetes Other        type 2   Diabetes Maternal Grandmother    Hypertension Maternal Grandfather    Diabetes Paternal Grandmother    Cancer Neg Hx    Heart disease Neg Hx    Hyperlipidemia Neg Hx    Stroke Neg Hx    Breast cancer Neg Hx     Social History   Socioeconomic History   Marital status: Divorced    Spouse name: Not on file   Number of children: Not on file   Years of  education: Not on file   Highest education level: Not on file  Occupational History   Not on file  Tobacco Use   Smoking status: Never   Smokeless tobacco: Never  Vaping Use   Vaping Use: Never used  Substance and Sexual Activity   Alcohol use: No    Alcohol/week: 0.0 standard drinks of alcohol   Drug use: No   Sexual activity: Yes    Partners: Male    Birth control/protection: Surgical    Comment: hysterectomy  Other Topics Concern   Not on file  Social History Narrative   Regular exercise-No   Children: 3- 2 boys and a girl   Caffienated drinks-no   Seat belt use often-yes   Regular Exercise-no   Smoke alarm in the home-yes   Firearms/guns in the home-no   History of physical abuse-no               Social Determinants of Health   Financial Resource Strain: Not on file  Food Insecurity: Not on file  Transportation Needs: Not on file  Physical Activity:  Not on file  Stress: Not on file  Social Connections: Not on file  Intimate Partner Violence: Not on file    Outpatient Medications Prior to Visit  Medication Sig Dispense Refill   allopurinol (ZYLOPRIM) 300 MG tablet Take 1 tablet (300 mg total) by mouth daily. 90 tablet 1   Cholecalciferol 1.25 MG (50000 UT) capsule Take 1 capsule (50,000 Units total) by mouth once a week. 12 capsule 0   colchicine 0.6 MG tablet Take 1 tablet (0.6 mg total) by mouth 2 (two) times daily. 60 tablet 2   indapamide (LOZOL) 1.25 MG tablet TAKE 1 TABLET BY MOUTH DAILY. 90 tablet 0   No facility-administered medications prior to visit.    Allergies  Allergen Reactions   Lisinopril Cough   Codeine     REACTION: itching and hives   Latex Dermatitis    Irritation with latex condoms   Penicillins Itching    ROS     Objective:    Physical Exam Constitutional:      General: She is not in acute distress.    Appearance: She is obese. She is not ill-appearing.  Cardiovascular:     Rate and Rhythm: Normal rate.  Pulmonary:      Effort: Pulmonary effort is normal.  Abdominal:       Comments: Hyperpigmentation with mild erythema to lower abdomen in skin fold. No maceration, abscess or drainage. No sign of bacterial infection.   Skin:    General: Skin is warm and dry.  Neurological:     Mental Status: She is alert and oriented to person, place, and time.     Sensory: Sensation is intact.     Motor: Motor function is intact.  Psychiatric:        Mood and Affect: Mood normal.        Behavior: Behavior normal.        Cognition and Memory: Cognition normal.     BP (!) 136/96 (BP Location: Left Arm, Patient Position: Sitting, Cuff Size: Large)   Pulse 70   Temp 97.6 F (36.4 C) (Temporal)   Ht 5' 6.75" (1.695 m)   Wt 291 lb (132 kg)   SpO2 98%   BMI 45.92 kg/m  Wt Readings from Last 3 Encounters:  02/01/22 291 lb (132 kg)  12/14/21 285 lb (129.3 kg)  11/16/21 286 lb (129.7 kg)       Assessment & Plan:   Problem List Items Addressed This Visit   None Visit Diagnoses     Candidal intertrigo    -  Primary   Relevant Medications   terbinafine (LAMISIL AT) 1 % cream      Discussed keeping the area clean and dry. Use hairdryer after bathing. Keep a cotton cloth between skin layers to prevent moisture build up. Use  topical Lamisil and follow up if worsening or for a recheck in 2-3 weeks.  She is considering scheduling with her gastric surgeon to have excess skin removed as was offered with surgery.   I am having Nishtha B. Striplin start on terbinafine. I am also having her maintain her allopurinol, colchicine, Cholecalciferol, and indapamide.  Meds ordered this encounter  Medications   terbinafine (LAMISIL AT) 1 % cream    Sig: Apply 1 Application topically 2 (two) times daily.    Dispense:  30 g    Refill:  0    Order Specific Question:   Supervising Provider    Answer:   Hillard Danker A [[id number]]

## 2022-02-15 ENCOUNTER — Encounter: Payer: Self-pay | Admitting: Family Medicine

## 2022-02-15 ENCOUNTER — Ambulatory Visit: Payer: Managed Care, Other (non HMO) | Admitting: Family Medicine

## 2022-02-15 VITALS — BP 138/82 | HR 80 | Temp 97.8°F | Ht 66.75 in | Wt 293.0 lb

## 2022-02-15 DIAGNOSIS — B372 Candidiasis of skin and nail: Secondary | ICD-10-CM

## 2022-02-15 NOTE — Progress Notes (Signed)
Subjective:     Patient ID: Janice Huang, female    DOB: [birth year], 51 y.o.   MRN: [id number]  Chief Complaint  Patient presents with   Follow-up    3 week f/u, seems to be getting better but states it still has a smell. Reports she has been trying to keep the area dry.    HPI Patient is in today for a 2 wk follow up on candidal intertrigo. Reports rash is improving. No new symptoms. No itching.  Using Lamisil 1% cream.     Health Maintenance Due  Topic Date Due   COLONOSCOPY (Pts 45-100yrs Insurance coverage will need to be confirmed)  Never done   Diabetic kidney evaluation - Urine ACR  04/13/2016   OPHTHALMOLOGY EXAM  05/10/2017   FOOT EXAM  07/24/2019   Zoster Vaccines- Shingrix (1 of 2) Never done   HEMOGLOBIN A1C  12/01/2021    Past Medical History:  Diagnosis Date   Gout attack    last flare up last week, in toes   Headache    History of hiatal hernia    small   Hyperlipidemia    Hypertension     Past Surgical History:  Procedure Laterality Date    c section x 2     ABDOMINAL HYSTERECTOMY     1 ovary removed   BREAST BIOPSY Right 02/23/2020   fibroadenoma   LAPAROSCOPIC GASTRIC SLEEVE RESECTION N/A 09/21/2014   Procedure: LAPAROSCOPIC GASTRIC SLEEVE RESECTION WITH HIATAL HERNIA REPAIR;  Surgeon: Johnathan Hausen, MD;  Location: [facility];  Service: General;  Laterality: N/A;   TUBAL LIGATION      Family History  Problem Relation Age of Onset   Hypertension Other    Diabetes Other        type 2   Diabetes Maternal Grandmother    Hypertension Maternal Grandfather    Diabetes Paternal Grandmother    Cancer Neg Hx    Heart disease Neg Hx    Hyperlipidemia Neg Hx    Stroke Neg Hx    Breast cancer Neg Hx     Social History   Socioeconomic History   Marital status: Divorced    Spouse name: Not on file   Number of children: Not on file   Years of education: Not on file   Highest education level: Not on file  Occupational History    Not on file  Tobacco Use   Smoking status: Never   Smokeless tobacco: Never  Vaping Use   Vaping Use: Never used  Substance and Sexual Activity   Alcohol use: No    Alcohol/week: 0.0 standard drinks of alcohol   Drug use: No   Sexual activity: Yes    Partners: Male    Birth control/protection: Surgical    Comment: hysterectomy  Other Topics Concern   Not on file  Social History Narrative   Regular exercise-No   Children: 3- 2 boys and a girl   Caffienated drinks-no   Seat belt use often-yes   Regular Exercise-no   Smoke alarm in the home-yes   Firearms/guns in the home-no   History of physical abuse-no               Social Determinants of Health   Financial Resource Strain: Not on file  Food Insecurity: Not on file  Transportation Needs: Not on file  Physical Activity: Not on file  Stress: Not on file  Social Connections: Not on file  Intimate Partner Violence: Not  on file    Outpatient Medications Prior to Visit  Medication Sig Dispense Refill   allopurinol (ZYLOPRIM) 300 MG tablet Take 1 tablet (300 mg total) by mouth daily. 90 tablet 1   Cholecalciferol 1.25 MG (50000 UT) capsule Take 1 capsule (50,000 Units total) by mouth once a week. 12 capsule 0   colchicine 0.6 MG tablet Take 1 tablet (0.6 mg total) by mouth 2 (two) times daily. 60 tablet 2   indapamide (LOZOL) 1.25 MG tablet TAKE 1 TABLET BY MOUTH DAILY. 90 tablet 0   terbinafine (LAMISIL AT) 1 % cream Apply 1 Application topically 2 (two) times daily. 30 g 0   No facility-administered medications prior to visit.    Allergies  Allergen Reactions   Lisinopril Cough   Codeine     REACTION: itching and hives   Latex Dermatitis    Irritation with latex condoms   Penicillins Itching    ROS     Objective:    Physical Exam  BP 138/82 (BP Location: Left Arm, Patient Position: Sitting, Cuff Size: Large)   Pulse 80   Temp 97.8 F (36.6 C) (Temporal)   Ht 5' 6.75" (1.695 m)   Wt 293 lb (132.9  kg)   SpO2 100%   BMI 46.23 kg/m  Wt Readings from Last 3 Encounters:  02/15/22 293 lb (132.9 kg)  02/01/22 291 lb (132 kg)  12/14/21 285 lb (129.3 kg)   Improving area of hyperpigmentation to lower abdomen along skin fold. No sign of bacterial infection.     Assessment & Plan:   Problem List Items Addressed This Visit   None Visit Diagnoses     Candidal intertrigo    -  Primary      Offered to switch to oral antifungal. She prefers to continue topical for now. May follow up here or with dermatologist if she does not continue to improve.   I am having Janice Huang maintain her allopurinol, colchicine, Cholecalciferol, indapamide, and terbinafine.  No orders of the defined types were placed in this encounter.

## 2022-02-15 NOTE — Patient Instructions (Signed)
Continue using the antifungal cream and keeping the area dry.   You may call and schedule with a dermatologist in case your treatment needs to be more aggressive or it is not continuing to improve.

## 2022-02-16 ENCOUNTER — Telehealth: Payer: Self-pay

## 2022-02-16 NOTE — Telephone Encounter (Signed)
Patient is calling in wanting to do a TOC from Dr.Jones to [place]. Okay for transfer?

## 2022-02-16 NOTE — Telephone Encounter (Signed)
Patient is scheduled   

## 2022-02-21 ENCOUNTER — Ambulatory Visit: Payer: Managed Care, Other (non HMO) | Admitting: Internal Medicine

## 2022-02-22 ENCOUNTER — Other Ambulatory Visit: Payer: Self-pay | Admitting: Internal Medicine

## 2022-02-22 ENCOUNTER — Encounter: Payer: Self-pay | Admitting: Plastic Surgery

## 2022-02-22 ENCOUNTER — Ambulatory Visit: Payer: Managed Care, Other (non HMO) | Admitting: Plastic Surgery

## 2022-02-22 VITALS — BP 144/84 | HR 74 | Ht 67.5 in | Wt 293.0 lb

## 2022-02-22 DIAGNOSIS — Z9884 Bariatric surgery status: Secondary | ICD-10-CM

## 2022-02-22 DIAGNOSIS — M793 Panniculitis, unspecified: Secondary | ICD-10-CM

## 2022-02-22 DIAGNOSIS — L987 Excessive and redundant skin and subcutaneous tissue: Secondary | ICD-10-CM | POA: Diagnosis not present

## 2022-02-22 DIAGNOSIS — E559 Vitamin D deficiency, unspecified: Secondary | ICD-10-CM

## 2022-02-22 DIAGNOSIS — Z6841 Body Mass Index (BMI) 40.0 and over, adult: Secondary | ICD-10-CM

## 2022-02-22 DIAGNOSIS — R21 Rash and other nonspecific skin eruption: Secondary | ICD-10-CM

## 2022-02-22 NOTE — Progress Notes (Signed)
Referring Provider Janith Lima, MD [place],  [place] [address]   CC:  Chief Complaint  Patient presents with   Consult      Janice Huang is an 51 y.o. female.  HPI: This a 51 year old female who is referred for evaluation for possible panniculectomy.  She has a long history of rashes underneath the pannus especially on the right side.  This most recent episode is lasted 6 months.  She has been medically treated by her primary care doctor with over-the-counter antifungals which have not resulted in resolution of her rashes though there is some improvement.  She has a history of gastric sleeve in 2016 she had a significant initial weight loss but is but is put most of the weight back on and is now currently a BMI of 45.  Allergies  Allergen Reactions   Lisinopril Cough   Codeine     REACTION: itching and hives   Latex Dermatitis    Irritation with latex condoms   Penicillins Itching    Outpatient Encounter Medications as of 02/22/2022  Medication Sig   allopurinol (ZYLOPRIM) 300 MG tablet Take 1 tablet (300 mg total) by mouth daily.   Cholecalciferol (VITAMIN D3) 1.25 MG (50000 UT) CAPS TAKE 1 CAPSULE BY MOUTH ONE TIME PER WEEK   colchicine 0.6 MG tablet Take 1 tablet (0.6 mg total) by mouth 2 (two) times daily.   indapamide (LOZOL) 1.25 MG tablet TAKE 1 TABLET BY MOUTH DAILY.   terbinafine (LAMISIL AT) 1 % cream Apply 1 Application topically 2 (two) times daily.   [DISCONTINUED] Cholecalciferol 1.25 MG (50000 UT) capsule Take 1 capsule (50,000 Units total) by mouth once a week.   No facility-administered encounter medications on file as of 02/22/2022.     Past Medical History:  Diagnosis Date   Gout attack    last flare up last week, in toes   Headache    History of hiatal hernia    small   Hyperlipidemia    Hypertension     Past Surgical History:  Procedure Laterality Date    c section x 2     ABDOMINAL HYSTERECTOMY     1 ovary  removed   BREAST BIOPSY Right 02/23/2020   fibroadenoma   LAPAROSCOPIC GASTRIC SLEEVE RESECTION N/A 09/21/2014   Procedure: LAPAROSCOPIC GASTRIC SLEEVE RESECTION WITH HIATAL HERNIA REPAIR;  Surgeon: Johnathan Hausen, MD;  Location: [facility];  Service: General;  Laterality: N/A;   TUBAL LIGATION      Family History  Problem Relation Age of Onset   Hypertension Other    Diabetes Other        type 2   Diabetes Maternal Grandmother    Hypertension Maternal Grandfather    Diabetes Paternal Grandmother    Cancer Neg Hx    Heart disease Neg Hx    Hyperlipidemia Neg Hx    Stroke Neg Hx    Breast cancer Neg Hx     Social History   Social History Narrative   Regular exercise-No   Children: 3- 2 boys and a girl   Caffienated drinks-no   Seat belt use often-yes   Regular Exercise-no   Smoke alarm in the home-yes   Firearms/guns in the home-no   History of physical abuse-no                 Review of Systems General: Denies fevers, chills, weight loss CV: Denies chest pain, shortness of breath, palpitations Skin: Rashes  underneath the pannus especially on the right side  Physical Exam    02/22/2022    3:07 PM 02/15/2022    3:53 PM 02/01/2022    1:33 PM  Vitals with BMI  Height 5' 7.5" 5' 6.75" 5' 6.75"  Weight 293 lbs 293 lbs 291 lbs  BMI 45.19 46.26 45.94  Systolic 144 138 502  Diastolic 84 82 96  Pulse 74 80 70    General:  No acute distress,  Alert and oriented, Non-Toxic, Normal speech and affect Abdomen: Patient has a significant amount of anterior abdominal wall fat.  She has both an equal amount above and below the umbilicus.  The infraumbilical skin fold has evidence of chronic irritation on both sides with the right side being the most severely involved.  Assessment/Plan Anterior abdominal wall excess skin and fat: While the patient is not an ideal candidate for any type of anterior abdominal wall contouring she would receive benefit from panniculectomy in that she  would have less irritation from the skin rubbing against itself and last less intertriginous sweating and fungal rashes.  I have recommended panniculectomy.  We discussed the location of the scars the possibility of wound separation and wound complications which in a patient her size is often 60 to 70%.  We discussed the use of drains.  The postoperative activity restrictions.  We also discussed postoperative complications due to her weight.  I strongly recommended that she resume her postgastrectomy diet and exercise regimen for maximum weight loss and physical conditioning prior to surgery.  She verbalizes understanding and requests that I proceed with insurance approval and surgical scheduling.  We will follow-up with me for any questions and at her preop appointment.  Santiago Glad 02/22/2022, 4:34 PM

## 2022-02-23 ENCOUNTER — Telehealth: Payer: Self-pay | Admitting: Plastic Surgery

## 2022-02-23 NOTE — Telephone Encounter (Signed)
Pt call requesting ICD codes that will be used for insurance due to her speaking with insurance company and they requested it from her to prepare.

## 2022-02-23 NOTE — Telephone Encounter (Signed)
LVM for patient to call back with any questions about her icd codes per message from front desk. Also let patient know that we would be working on her case in the coming weeks as we do have other visits to complete [facility] for ahead of hers since she was just seen yesterday.

## 2022-03-06 ENCOUNTER — Ambulatory Visit
Admission: RE | Admit: 2022-03-06 | Discharge: 2022-03-06 | Disposition: A | Discharge status: Home / Self Care | Payer: Managed Care, Other (non HMO) | Source: Ambulatory Visit | Attending: Internal Medicine | Admitting: Internal Medicine

## 2022-03-06 DIAGNOSIS — Z1231 Encounter for screening mammogram for malignant neoplasm of breast: Secondary | ICD-10-CM

## 2022-03-08 ENCOUNTER — Ambulatory Visit: Payer: Managed Care, Other (non HMO) | Admitting: Family Medicine

## 2022-03-08 ENCOUNTER — Encounter: Payer: Self-pay | Admitting: Family Medicine

## 2022-03-08 VITALS — BP 162/94 | HR 82 | Temp 97.8°F | Ht 67.5 in | Wt 297.0 lb

## 2022-03-08 DIAGNOSIS — H6092 Unspecified otitis externa, left ear: Secondary | ICD-10-CM

## 2022-03-08 DIAGNOSIS — H6692 Otitis media, unspecified, left ear: Secondary | ICD-10-CM

## 2022-03-08 DIAGNOSIS — B372 Candidiasis of skin and nail: Secondary | ICD-10-CM | POA: Diagnosis not present

## 2022-03-08 DIAGNOSIS — R232 Flushing: Secondary | ICD-10-CM | POA: Diagnosis not present

## 2022-03-08 MED ORDER — CLOTRIMAZOLE-BETAMETHASONE 1-0.05 % EX CREA: 1.0000 | TOPICAL_CREAM | Freq: Every day | CUTANEOUS | 0 refills | Status: DC

## 2022-03-08 MED ORDER — SULFAMETHOXAZOLE-TRIMETHOPRIM 800-160 MG PO TABS: 1.0000 | ORAL_TABLET | Freq: Two times a day (BID) | ORAL | 0 refills | Status: DC

## 2022-03-08 MED ORDER — CIPROFLOXACIN-DEXAMETHASONE 0.3-0.1 % OT SUSP: 4.0000 [drp] | Freq: Two times a day (BID) | OTIC | 0 refills | Status: DC

## 2022-03-08 MED ORDER — CLOTRIMAZOLE-BETAMETHASONE 1-0.05 % EX CREA: 1.0000 | TOPICAL_CREAM | Freq: Every day | CUTANEOUS | 0 refills | Status: AC

## 2022-03-08 NOTE — Patient Instructions (Addendum)
Take the antibiotic by mouth as well as using the drops in your left ear.  Follow-up if this is getting worse or not back to baseline when you complete the antibiotic.  You may use the drops for 7 to 10 days and then stop  Stop using Lamisil on the rash on your abdomen and start using the new prescription which is a combination.  You may try over the counter Black Cohosh for hot flashes.   Follow-up with Dr. Ronnald Ramp in 4 weeks or sooner if the rash is not improving.   Managing Hot Flashes During Menopause You will learn what hot flashes/night sweats are, what causes them and what the treatment methods are. To view the content, go to this web address: https://pe.elsevier.[email or web address]  This video will expire on: 11/12/2023. If you need access to this video following this date, please reach out to the healthcare provider who assigned it to you. This information is not intended to replace advice given to you by your health care provider. Make sure you discuss any questions you have with your health care provider. Elsevier Patient Education  [place].

## 2022-03-08 NOTE — Progress Notes (Signed)
Subjective:  Janice Huang is a 51 y.o. female who presents for 5-6 day history of left ear pain. Treatment to date: sweet oil and OTC ear drops.   She also c/o ongoing hot flashes and then feeling cold. Hx of partial hysterectomy.   Also c/o worsening rash on her right lower abdomen in the skin fold. Using lamisil.   Denies fever, chills, dizziness, headache, nasal congestion, rhinorrhea, cough, N/V/D.     ROS as in subjective.   Objective: Vitals:   03/08/22 1605  BP: (!) 162/94  Pulse: 82  Temp: 97.8 F (36.6 C)  SpO2: 98%    General appearance: Alert, WD/WN, no distress, mildly ill appearing                             Skin: warm, no rash                           Head: no sinus tenderness                            Eyes: conjunctiva normal, corneas clear, PERRLA                            Ears: left ear canal with edema and erythema, left TM retracted and erythematous, right TM and external ear canal normal                          Nose: septum midline             Mouth/throat: MMM, tongue normal, mild pharyngeal erythema                           Neck: supple, no adenopathy, nontender                          Heart: RRR                         Lungs: CTA bilaterally, no wheezes, rales, or rhonchi   Skin: lower abdominal skin fold on right with hyperpigmentation and erythema.       Assessment: Acute otitis media, left - Plan: sulfamethoxazole-trimethoprim (BACTRIM DS) 800-160 MG tablet, DISCONTINUED: sulfamethoxazole-trimethoprim (BACTRIM DS) 800-160 MG tablet  Inflammation of left ear canal - Plan: ciprofloxacin-dexamethasone (CIPRODEX) OTIC suspension, DISCONTINUED: ciprofloxacin-dexamethasone (CIPRODEX) OTIC suspension  Hot flashes  Candidal intertrigo - Plan: clotrimazole-betamethasone (LOTRISONE) cream, DISCONTINUED: clotrimazole-betamethasone (LOTRISONE) cream   Plan: Bactrim and Ciprodex prescribed for left ear infection.  Lotrisone prescribed  for rash suspicious for intertrigo. Possible early cellulitis which Bactrim will also be beneficial for as well.  Suspect hot flashes are due to menopause. Recommend trying OTC Black Cohosh. May schedule with gynecologist to discuss other treatment options.

## 2022-03-15 ENCOUNTER — Encounter: Payer: Managed Care, Other (non HMO) | Admitting: Family Medicine

## 2022-04-10 ENCOUNTER — Telehealth: Payer: Self-pay | Admitting: Family Medicine

## 2022-04-10 DIAGNOSIS — I10 Essential (primary) hypertension: Secondary | ICD-10-CM

## 2022-04-10 MED ORDER — INDAPAMIDE 1.25 MG PO TABS: 1.2500 mg | ORAL_TABLET | Freq: Every day | ORAL | 0 refills | Status: DC

## 2022-04-10 NOTE — Telephone Encounter (Signed)
Rx sent 

## 2022-04-10 NOTE — Telephone Encounter (Signed)
Patient needs her indapamide refilled - please send to [facility] on [address] in [place], [place]    Last visit:  03/12/2022

## 2022-04-11 ENCOUNTER — Other Ambulatory Visit: Payer: Self-pay | Admitting: Internal Medicine

## 2022-04-11 DIAGNOSIS — I10 Essential (primary) hypertension: Secondary | ICD-10-CM

## 2022-05-28 ENCOUNTER — Other Ambulatory Visit: Payer: Self-pay | Admitting: Internal Medicine

## 2022-05-28 DIAGNOSIS — Z9884 Bariatric surgery status: Secondary | ICD-10-CM

## 2022-05-28 DIAGNOSIS — E559 Vitamin D deficiency, unspecified: Secondary | ICD-10-CM

## 2022-06-21 ENCOUNTER — Telehealth: Payer: Self-pay

## 2022-06-21 ENCOUNTER — Other Ambulatory Visit: Payer: Self-pay | Admitting: Family Medicine

## 2022-06-21 DIAGNOSIS — I10 Essential (primary) hypertension: Secondary | ICD-10-CM

## 2022-06-23 ENCOUNTER — Encounter: Payer: Managed Care, Other (non HMO) | Admitting: Family Medicine

## 2022-06-23 NOTE — Progress Notes (Signed)
Patient appearing on report for [employer] - Hypertension Control report due to last documented ambulatory blood pressure of 162/94 on 03/08/22.  Of note, this visit was for treatment of otitis media and previous documented readings were <140/90.  Next appointment with PCP is 06/28/22.   Outreached patient to discuss hypertension control and medication management.   Current antihypertensives: Indapamide 1.25mg  daily   Patient does not have an automated upper arm home BP machine and was unable to provide additional readings.  Assessment/Plan: - Currently uncontrolled - Suggested automatic upper arm BP monitor for home; some insurances will cover (encouraged her to contact and see) - Evaluated need of gout medications since indapamide can increase uric acid.  Patient has not had a flare in quite sometime.  If these were to become frequent, I would recommend discontinuing use of thiazide diuretic. - I suggest routine BP evaluation, and if elevation continues could consider addition of a calcium channel blocker - Recommend CMP, CBC, Lipid panel, and A1c since last values were a year ago  Medication Management: Pre-diabetes - Patient mentioned interest in GLP1 in light of recent pre-diabetic A1c level and weight gain - Appears insurance will cover as a Tier 3 medication with a Prior Auth (unsure criteria for [place] approval) - If GLP1 not approved, could initiate metformin daily  Hot Flashes/Menopause - Patient endorses recent hot flashes she believes may be menopausal, and inquired about symptom relief she will discuss with PCP at upcoming visit.  Darlina Guys, PharmD, DPLA

## 2022-06-23 NOTE — Patient Instructions (Addendum)
Ms. Vannest,  It was a pleasure to speak with you yesterday in regard to your medications before your upcoming appointment with Harland Dingwall.  I have notified her of our telephone visit and provided recommendations around it.  I did look at your insurance coverage for weekly injectables that could help with A1c and weight loss.  Your insurance will cover these as a Tier 3 drug if a prior authorization is approved.  In the event they would not approve that, we could always try an oral medication first called metformin.  It is effective at reducing A1c and will provide weight loss benefit sometimes.  I will let you and Vickie discuss the options at your upcoming appointment.  I also mentioned to her the hot flashes you have been having for you all to talk through symptom management for that.  I encourage you to monitor your blood pressure regularly at home, also.  Insurance will sometimes pay for a meter, and you can call the number on the back of your card to inquire about that.  If they will, we can get a prescription in for that.  If not, [employer] makes a very reliable automatic upper arm cuff that can be purchased OTC for about $30.  I am including my direct number at the end of this message; reach out if you have questions or further needs from a medication standpoint.    Have a great weekend!  Darlina Guys, PharmD, [facility]

## 2022-06-28 ENCOUNTER — Encounter: Payer: Self-pay | Admitting: Family Medicine

## 2022-06-28 ENCOUNTER — Ambulatory Visit (INDEPENDENT_AMBULATORY_CARE_PROVIDER_SITE_OTHER): Payer: Managed Care, Other (non HMO) | Admitting: Family Medicine

## 2022-06-28 VITALS — BP 144/96 | HR 72 | Temp 97.8°F | Ht 67.5 in | Wt 296.0 lb

## 2022-06-28 DIAGNOSIS — M1000 Idiopathic gout, unspecified site: Secondary | ICD-10-CM

## 2022-06-28 DIAGNOSIS — Z1211 Encounter for screening for malignant neoplasm of colon: Secondary | ICD-10-CM

## 2022-06-28 DIAGNOSIS — E119 Type 2 diabetes mellitus without complications: Secondary | ICD-10-CM

## 2022-06-28 DIAGNOSIS — E785 Hyperlipidemia, unspecified: Secondary | ICD-10-CM

## 2022-06-28 DIAGNOSIS — Z0001 Encounter for general adult medical examination with abnormal findings: Secondary | ICD-10-CM | POA: Diagnosis not present

## 2022-06-28 DIAGNOSIS — I10 Essential (primary) hypertension: Secondary | ICD-10-CM | POA: Diagnosis not present

## 2022-06-28 DIAGNOSIS — E559 Vitamin D deficiency, unspecified: Secondary | ICD-10-CM | POA: Diagnosis not present

## 2022-06-28 NOTE — Patient Instructions (Addendum)
Please check with your insurance in regards to GLP-1 once weekly injections.  Mounjaro or Ozempic.   Follow up with me for your elevated blood pressure and if you would like to discuss starting on one of these.    Preventive Care 18-52 Years Old, Female Preventive care refers to lifestyle choices and visits with your health care provider that can promote health and wellness. Preventive care visits are also called wellness exams. What can I expect for my preventive care visit? Counseling Your health care provider may ask you questions about your: Medical history, including: Past medical problems. Family medical history. Pregnancy history. Current health, including: Menstrual cycle. Method of birth control. Emotional well-being. Home life and relationship well-being. Sexual activity and sexual health. Lifestyle, including: Alcohol, nicotine or tobacco, and drug use. Access to firearms. Diet, exercise, and sleep habits. Work and work Statistician. Sunscreen use. Safety issues such as seatbelt and bike helmet use. Physical exam Your health care provider will check your: Height and weight. These may be used to calculate your BMI (body mass index). BMI is a measurement that tells if you are at a healthy weight. Waist circumference. This measures the distance around your waistline. This measurement also tells if you are at a healthy weight and may help predict your risk of certain diseases, such as type 2 diabetes and high blood pressure. Heart rate and blood pressure. Body temperature. Skin for abnormal spots. What immunizations do I need?  Vaccines are usually given at various ages, according to a schedule. Your health care provider will recommend vaccines for you based on your age, medical history, and lifestyle or other factors, such as travel or where you work. What tests do I need? Screening Your health care provider may recommend screening tests for certain conditions. This may  include: Lipid and cholesterol levels. Diabetes screening. This is done by checking your blood sugar (glucose) after you have not eaten for a while (fasting). Pelvic exam and Pap test. Hepatitis B test. Hepatitis C test. HIV (human immunodeficiency virus) test. STI (sexually transmitted infection) testing, if you are at risk. Lung cancer screening. Colorectal cancer screening. Mammogram. Talk with your health care provider about when you should start having regular mammograms. This may depend on whether you have a family history of breast cancer. BRCA-related cancer screening. This may be done if you have a family history of breast, ovarian, tubal, or peritoneal cancers. Bone density scan. This is done to screen for osteoporosis. Talk with your health care provider about your test results, treatment options, and if necessary, the need for more tests. Follow these instructions at home: Eating and drinking  Eat a diet that includes fresh fruits and vegetables, whole grains, lean protein, and low-fat dairy products. Take vitamin and mineral supplements as recommended by your health care provider. Do not drink alcohol if: Your health care provider tells you not to drink. You are pregnant, may be pregnant, or are planning to become pregnant. If you drink alcohol: Limit how much you have to 0-1 drink a day. Know how much alcohol is in your drink. In the [place] one drink equals one 12 oz bottle of beer (355 mL), one 5 oz glass of wine (148 mL), or one 1 oz glass of hard liquor (44 mL). Lifestyle Brush your teeth every morning and night with fluoride toothpaste. Floss one time each day. Exercise for at least 30 minutes 5 or more days each week. Do not use any products that contain nicotine or tobacco. These  products include cigarettes, chewing tobacco, and vaping devices, such as e-cigarettes. If you need help quitting, ask your health care provider. Do not use drugs. If you are sexually  active, practice safe sex. Use a condom or other form of protection to prevent STIs. If you do not wish to become pregnant, use a form of birth control. If you plan to become pregnant, see your health care provider for a prepregnancy visit. Take aspirin only as told by your health care provider. Make sure that you understand how much to take and what form to take. Work with your health care provider to find out whether it is safe and beneficial for you to take aspirin daily. Find healthy ways to manage stress, such as: Meditation, yoga, or listening to music. Journaling. Talking to a trusted person. Spending time with friends and family. Minimize exposure to UV radiation to reduce your risk of skin cancer. Safety Always wear your seat belt while driving or riding in a vehicle. Do not drive: If you have been drinking alcohol. Do not ride with someone who has been drinking. When you are tired or distracted. While texting. If you have been using any mind-altering substances or drugs. Wear a helmet and other protective equipment during sports activities. If you have firearms in your house, make sure you follow all gun safety procedures. Seek help if you have been physically or sexually abused. What's next? Visit your health care provider once a year for an annual wellness visit. Ask your health care provider how often you should have your eyes and teeth checked. Stay up to date on all vaccines. This information is not intended to replace advice given to you by your health care provider. Make sure you discuss any questions you have with your health care provider. Document Revised: 11/03/2020 Document Reviewed: 11/03/2020 Elsevier Patient Education  [place].

## 2022-06-28 NOTE — Progress Notes (Signed)
Subjective:    Patient ID: Janice Huang, female    DOB: Oct 03, 1970, 52 y.o.   MRN: [id number]  HPI Chief Complaint  Patient presents with   Annual Exam    Script for [facility] orders   She is here for a complete physical exam.  Other providers: OB/GYN - Dr. Dellis Filbert   Hgb A1c 6.6% in 2015 but in prediabetes range since then.    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    Review of Systems Review of Systems Constitutional: -fever, -chills, -sweats, -unexpected weight change,-fatigue ENT: -runny nose, -ear pain, -sore throat Cardiology:  -chest pain, -palpitations, -edema Respiratory: -cough, -shortness of breath, -wheezing Gastroenterology: -abdominal pain, -nausea, -vomiting, -diarrhea, -constipation Hematology: -bleeding or bruising problems Musculoskeletal: -arthralgias, -myalgias, -joint swelling, -back pain Ophthalmology: -vision changes Urology: -dysuria, -difficulty urinating, -hematuria, -urinary frequency, -urgency Neurology: -headache, -weakness, -tingling, -numbness       Objective:   Physical Exam  BP (!) 144/96 (BP Location: Left Arm, Patient Position: Sitting, Cuff Size: Large)   Pulse 72   Temp 97.8 F (36.6 C) (Temporal)   Ht 5' 7.5" (1.715 m)   Wt 296 lb (134.3 kg)   SpO2 98%   BMI 45.68 kg/m   General Appearance:    Alert, cooperative, no distress, appears stated age  Head:    Normocephalic, without obvious abnormality, atraumatic  Eyes:    PERRL, conjunctiva/corneas clear, EOM's intact  Ears:    Normal TM's and external ear canals  Nose:   Nares normal, mucosa normal, no drainage   Throat:   Lips, mucosa, and tongue normal  Neck:    Supple, no lymphadenopathy;  thyroid:  no   enlargement/tenderness/nodules; no JVD  Back:    Spine nontender, no curvature, ROM normal, no CVA     tenderness  Lungs:     Clear to auscultation bilaterally without wheezes, rales or     ronchi; respirations unlabored  Chest Wall:    No tenderness or deformity   Heart:    Regular rate and rhythm  Breast Exam:    OB/GYN  Abdomen:     Soft, non-tender, nondistended, normoactive bowel sounds,    no masses, no hepatosplenomegaly  Genitalia:    OB/GYN     Extremities:   No clubbing, cyanosis or edema  Pulses:   2+ and symmetric all extremities  Skin:   Skin color, texture, turgor normal, no rashes or lesions  Lymph nodes:   Cervical, supraclavicular nodes normal  Neurologic:   CNII-XII intact, normal strength, sensation and gait          Psych:   Normal mood, affect, hygiene and grooming.         Assessment & Plan:  Encounter for general adult medical examination with abnormal findings -Preventive health care reviewed.  She sees her OB/GYN.  She has never had a colonoscopy so referral placed to GI. Continue with regular dental and eye exams. Immunizations discussed.  She prefers for today's visit to be preventive health only.   Essential hypertension, benign - Plan: CBC with Differential/Platelet, Comprehensive metabolic panel, CANCELED: CBC with Differential/Platelet, CANCELED: Comprehensive metabolic panel -follow up pending labs and to discuss HTN  Hyperlipidemia with target LDL less than 130 - Plan: Lipid panel, CANCELED: Lipid panel  Vitamin D deficiency disease - Plan: VITAMIN D 25 Hydroxy (Vit-D Deficiency, Fractures), CANCELED: VITAMIN D 25 Hydroxy (Vit-D Deficiency, Fractures)   Idiopathic gout, unspecified chronicity, unspecified site - Plan: Uric acid, CANCELED: Uric acid -follow up pending uric acid level  Morbid obesity (HCC) - Plan: TSH, CANCELED: TSH -she will follow up for discussion about GLP-1. Consider referral to [facility]  also  Screen for colon cancer- Referral to GI  Type 2 diabetes mellitus without complication, without long-term current use of insulin ([place]) - Plan: Hemoglobin A1c, Microalbumin / creatinine urine ratio, TSH, CANCELED: TSH, CANCELED: Hemoglobin A1c, CANCELED: Microalbumin / creatinine urine ratio - check A1c and follow up. Consider Mounjaro or Ozempic at follow up visit

## 2022-06-30 ENCOUNTER — Other Ambulatory Visit: Payer: Self-pay | Admitting: Family Medicine

## 2022-07-03 LAB — CBC WITH DIFFERENTIAL/PLATELET
Basophils Absolute: 0 10*3/uL (ref 0.0–0.2)
Basos: 1 %
EOS (ABSOLUTE): 0.1 10*3/uL (ref 0.0–0.4)
Eos: 2 %
Hematocrit: 39.7 % (ref 34.0–46.6)
Hemoglobin: 13.2 g/dL (ref 11.1–15.9)
Immature Grans (Abs): 0 10*3/uL (ref 0.0–0.1)
Immature Granulocytes: 0 %
Lymphocytes Absolute: 2 10*3/uL (ref 0.7–3.1)
Lymphs: 30 %
MCH: 26.7 pg (ref 26.6–33.0)
MCHC: 33.2 g/dL (ref 31.5–35.7)
MCV: 80 fL (ref 79–97)
Monocytes Absolute: 0.5 10*3/uL (ref 0.1–0.9)
Monocytes: 8 %
Neutrophils Absolute: 3.8 10*3/uL (ref 1.4–7.0)
Neutrophils: 59 %
Platelets: 277 10*3/uL (ref 150–450)
RBC: 4.94 x10E6/uL (ref 3.77–5.28)
RDW: 13.9 % (ref 11.7–15.4)
WBC: 6.4 10*3/uL (ref 3.4–10.8)

## 2022-07-03 LAB — COMPREHENSIVE METABOLIC PANEL
ALT: 19 IU/L (ref 0–32)
AST: 20 IU/L (ref 0–40)
Albumin/Globulin Ratio: 1.7 (ref 1.2–2.2)
Albumin: 4.5 g/dL (ref 3.8–4.9)
Alkaline Phosphatase: 74 IU/L (ref 44–121)
BUN/Creatinine Ratio: 13 (ref 9–23)
BUN: 11 mg/dL (ref 6–24)
Bilirubin Total: 0.6 mg/dL (ref 0.0–1.2)
CO2: 25 mmol/L (ref 20–29)
Calcium: 9.6 mg/dL (ref 8.7–10.2)
Chloride: 97 mmol/L (ref 96–106)
Creatinine, Ser: 0.87 mg/dL (ref 0.57–1.00)
Globulin, Total: 2.7 g/dL (ref 1.5–4.5)
Glucose: 99 mg/dL (ref 70–99)
Potassium: 4 mmol/L (ref 3.5–5.2)
Sodium: 139 mmol/L (ref 134–144)
Total Protein: 7.2 g/dL (ref 6.0–8.5)
eGFR: 81 mL/min/{1.73_m2} (ref >59)

## 2022-07-03 LAB — LIPID PANEL
Chol/HDL Ratio: 5.5 ratio — ABNORMAL HIGH (ref 0.0–4.4)
Cholesterol, Total: 309 mg/dL — ABNORMAL HIGH (ref 100–199)
HDL: 56 mg/dL (ref >39)
LDL Chol Calc (NIH): 228 mg/dL — ABNORMAL HIGH (ref 0–99)
Triglycerides: 135 mg/dL (ref 0–149)
VLDL Cholesterol Cal: 25 mg/dL (ref 5–40)

## 2022-07-03 LAB — HEMOGLOBIN A1C
Est. average glucose Bld gHb Est-mCnc: 137 mg/dL
Hgb A1c MFr Bld: 6.4 % — ABNORMAL HIGH (ref 4.8–5.6)

## 2022-07-03 LAB — TSH: TSH: 1.27 u[IU]/mL (ref 0.450–4.500)

## 2022-07-03 LAB — URIC ACID: Uric Acid: 9.8 mg/dL — ABNORMAL HIGH (ref 3.0–7.2)

## 2022-07-03 LAB — MICROALBUMIN / CREATININE URINE RATIO
Creatinine, Urine: 139.3 mg/dL
Microalb/Creat Ratio: 8 mg/g creat (ref 0–29)
Microalbumin, Urine: 10.7 ug/mL

## 2022-07-03 LAB — VITAMIN D 25 HYDROXY (VIT D DEFICIENCY, FRACTURES): Vit D, 25-Hydroxy: 34.1 ng/mL (ref 30.0–100.0)

## 2022-07-04 ENCOUNTER — Encounter: Payer: Self-pay | Admitting: Gastroenterology

## 2022-07-06 ENCOUNTER — Ambulatory Visit: Payer: Managed Care, Other (non HMO) | Admitting: Family Medicine

## 2022-07-07 ENCOUNTER — Telehealth: Payer: Self-pay | Admitting: Family Medicine

## 2022-07-07 ENCOUNTER — Other Ambulatory Visit: Payer: Self-pay | Admitting: Family Medicine

## 2022-07-07 DIAGNOSIS — M1000 Idiopathic gout, unspecified site: Secondary | ICD-10-CM

## 2022-07-07 DIAGNOSIS — E7800 Pure hypercholesterolemia, unspecified: Secondary | ICD-10-CM

## 2022-07-07 MED ORDER — ATORVASTATIN CALCIUM 20 MG PO TABS: 20.0000 mg | ORAL_TABLET | Freq: Every day | ORAL | 1 refills | Status: DC

## 2022-07-07 MED ORDER — COLCHICINE 0.6 MG PO TABS: 0.6000 mg | ORAL_TABLET | Freq: Two times a day (BID) | ORAL | 0 refills | Status: AC

## 2022-07-07 NOTE — Telephone Encounter (Signed)
This will be 1st fill with you.. please advise on refill

## 2022-07-07 NOTE — Telephone Encounter (Signed)
Called pt and informed of information below. Pt states she needs more colchicine but insurance will only cover it if it is a 90 day supply. Rx sent for 90 days but informed pt this is not for daily use, just for acute flare ups. Allopurinol needs to be daily preventative. Pt verbalized understanding.

## 2022-07-07 NOTE — Addendum Note (Signed)
Addended by: Rossie Muskrat on: 07/07/2022 09:50 AM   Modules accepted: Orders

## 2022-07-07 NOTE — Addendum Note (Signed)
Addended by: Rossie Muskrat on: 07/07/2022 10:28 AM   Modules accepted: Orders

## 2022-07-07 NOTE — Telephone Encounter (Signed)
Called pt and relayed results, also relayed that she should be taking allopurinol. Pt states she has been taking both colchicine and allopurinol because she had a gout attack and it has not been working. She is not sure if she just waited too long or what but she is in a lot of pain and the allopurinol is not seeming to work. She would like to know if you recommend anything further?

## 2022-07-07 NOTE — Progress Notes (Signed)
Her LDL (the bad cholesterol) is severely elevated. She is not on a statin and I will send one to her pharmacy to help reduce her risk of stroke, heart disease and heart attack. Her risk is very high with her current cholesterol level. I also recommend cutting back on foods high in fat, fried foods, butter, etc. Blood sugars are controlled with A1c 6.4%. we will discuss the once weekly injectable at her follow up visit later this month.

## 2022-07-07 NOTE — Telephone Encounter (Signed)
Caller & Relationship to patient:  self   Call back number:  [phone]   Date of last office visit:   Date of next office visit:  07/19/2022   Medication(s) to be refilled: colchicine         Preferred Pharmacy:  [facility] [address]

## 2022-07-14 ENCOUNTER — Ambulatory Visit (AMBULATORY_SURGERY_CENTER): Payer: Managed Care, Other (non HMO)

## 2022-07-14 VITALS — Ht 67.5 in | Wt 298.0 lb

## 2022-07-14 DIAGNOSIS — Z1211 Encounter for screening for malignant neoplasm of colon: Secondary | ICD-10-CM

## 2022-07-14 MED ORDER — NA SULFATE-K SULFATE-MG SULF 17.5-3.13-1.6 GM/177ML PO SOLN: 1.0000 | Freq: Once | ORAL | 0 refills | Status: AC

## 2022-07-14 NOTE — Progress Notes (Signed)

## 2022-07-19 ENCOUNTER — Encounter: Payer: Self-pay | Admitting: Family Medicine

## 2022-07-19 ENCOUNTER — Ambulatory Visit: Payer: Managed Care, Other (non HMO) | Admitting: Family Medicine

## 2022-07-19 VITALS — BP 136/86 | HR 85 | Temp 97.6°F | Ht 67.5 in | Wt 298.0 lb

## 2022-07-19 DIAGNOSIS — M1000 Idiopathic gout, unspecified site: Secondary | ICD-10-CM

## 2022-07-19 DIAGNOSIS — I10 Essential (primary) hypertension: Secondary | ICD-10-CM

## 2022-07-19 DIAGNOSIS — E1169 Type 2 diabetes mellitus with other specified complication: Secondary | ICD-10-CM

## 2022-07-19 DIAGNOSIS — E7800 Pure hypercholesterolemia, unspecified: Secondary | ICD-10-CM

## 2022-07-19 DIAGNOSIS — E669 Obesity, unspecified: Secondary | ICD-10-CM

## 2022-07-19 MED ORDER — ALLOPURINOL 300 MG PO TABS: 300.0000 mg | ORAL_TABLET | Freq: Every day | ORAL | 1 refills | Status: DC

## 2022-07-19 MED ORDER — INDAPAMIDE 1.25 MG PO TABS: ORAL_TABLET | ORAL | 1 refills | Status: DC

## 2022-07-19 MED ORDER — TIRZEPATIDE 2.5 MG/0.5ML ~~LOC~~ SOAJ: 2.5000 mg | SUBCUTANEOUS | 0 refills | Status: DC

## 2022-07-19 NOTE — Progress Notes (Signed)
Subjective:     Patient ID: Janice Huang, female    DOB: August 28, 1970, 52 y.o.   MRN: [id number]  Chief Complaint  Patient presents with   Follow-up    Discuss once weekly injectable    HPI Patient is in today for follow up on chronic health conditions.   Obesity- states she really does not eat a lot. Eats snacks, junk food. States she does not overeat. Would like to start on GLP-1. Denies hx of pancreatitis. No personal or fam hx of thyroid cancer.  Hx of weight loss surgery.   Hx of diabetes that has been well controlled and in prediabetes range.   HL- started statin. LDL was 228 on 06/30/22  HTN- does not monitor BP Taking indapamide 1.25 mg daily    Gout- last flare 2 weeks ago.  She has not been taking allopurinol. Last uric acid level 9.8%    Health Maintenance Due  Topic Date Due   COLONOSCOPY (Pts 45-71yr Insurance coverage will need to be confirmed)  Never done   OPHTHALMOLOGY EXAM  05/10/2017   FOOT EXAM  07/24/2019    Past Medical History:  Diagnosis Date   Gout attack    last flare up last week, in toes   Headache    History of hiatal hernia    fixed with weight loss surgery   Hyperlipidemia    Hypertension    Sleep apnea    prior to weight loss surgery    Past Surgical History:  Procedure Laterality Date    c section x 2     ABDOMINAL HYSTERECTOMY     1 ovary removed   BREAST BIOPSY Right 02/23/2020   fibroadenoma   LAPAROSCOPIC GASTRIC SLEEVE RESECTION N/A 09/21/2014   Procedure: LAPAROSCOPIC GASTRIC SLEEVE RESECTION WITH HIATAL HERNIA REPAIR;  Surgeon: MJohnathan Hausen MD;  Location: [facility];  Service: General;  Laterality: N/A;   TUBAL LIGATION      Family History  Problem Relation Age of Onset   Diabetes Maternal Grandmother    Hypertension Maternal Grandfather    Diabetes Paternal Grandmother    Hypertension Other    Diabetes Other        type 2   Cancer Neg Hx    Heart disease Neg Hx    Hyperlipidemia Neg Hx     Stroke Neg Hx    Breast cancer Neg Hx    Colon cancer Neg Hx    Colon polyps Neg Hx    Esophageal cancer Neg Hx    Rectal cancer Neg Hx    Stomach cancer Neg Hx     Social History   Socioeconomic History   Marital status: Divorced    Spouse name: Not on file   Number of children: Not on file   Years of education: Not on file   Highest education level: Not on file  Occupational History   Not on file  Tobacco Use   Smoking status: Never   Smokeless tobacco: Never  Vaping Use   Vaping Use: Never used  Substance and Sexual Activity   Alcohol use: No    Alcohol/week: 0.0 standard drinks of alcohol   Drug use: No   Sexual activity: Yes    Partners: Male    Birth control/protection: Surgical    Comment: hysterectomy  Other Topics Concern   Not on file  Social History Narrative   Regular exercise-No   Children: 3- 2 boys and a girl   Caffienated drinks-no  Seat belt use often-yes   Regular Exercise-no   Smoke alarm in the home-yes   Firearms/guns in the home-no   History of physical abuse-no               Social Determinants of Health   Financial Resource Strain: Not on file  Food Insecurity: Not on file  Transportation Needs: Not on file  Physical Activity: Not on file  Stress: Not on file  Social Connections: Not on file  Intimate Partner Violence: Not on file    Outpatient Medications Prior to Visit  Medication Sig Dispense Refill   ATHLETES FOOT, CLOTRIMAZOLE, 1 % cream Apply topically.     atorvastatin (LIPITOR) 20 MG tablet Take 1 tablet (20 mg total) by mouth daily. 90 tablet 1   clotrimazole-betamethasone (LOTRISONE) cream Apply 1 Application topically daily. 30 g 0   colchicine 0.6 MG tablet Take 1 tablet (0.6 mg total) by mouth 2 (two) times daily. 180 tablet 0   meloxicam (MOBIC) 15 MG tablet Take 15 mg by mouth daily.     methocarbamol (ROBAXIN) 500 MG tablet Take 1,000 mg by mouth 4 (four) times daily as needed for muscle spasms.      allopurinol (ZYLOPRIM) 300 MG tablet Take 1 tablet (300 mg total) by mouth daily. 90 tablet 1   indapamide (LOZOL) 1.25 MG tablet TAKE 1 TABLET(1.25 MG) BY MOUTH DAILY 90 tablet 0   No facility-administered medications prior to visit.    Allergies  Allergen Reactions   Lisinopril Cough   Codeine     REACTION: itching and hives   Latex Dermatitis    Irritation with latex condoms   Penicillins Itching    ROS     Objective:    Physical Exam Constitutional:      General: She is not in acute distress.    Appearance: She is not ill-appearing.  Cardiovascular:     Rate and Rhythm: Normal rate.  Pulmonary:     Effort: Pulmonary effort is normal.  Neurological:     General: No focal deficit present.     Mental Status: She is alert and oriented to person, place, and time.  Psychiatric:        Mood and Affect: Mood normal.        Behavior: Behavior normal.     BP 136/86 (BP Location: Left Arm, Patient Position: Sitting, Cuff Size: Large)   Pulse 85   Temp 97.6 F (36.4 C) (Temporal)   Ht 5' 7.5" (1.715 m)   Wt 298 lb (135.2 kg)   SpO2 98%   BMI 45.98 kg/m  Wt Readings from Last 3 Encounters:  07/19/22 298 lb (135.2 kg)  07/14/22 298 lb (135.2 kg)  06/28/22 296 lb (134.3 kg)       Assessment & Plan:   Problem List Items Addressed This Visit       Cardiovascular and Mediastinum   Essential hypertension, benign - Primary    BP close to goal range.  Continue indapamide.  Recommend low-sodium diet and increasing physical activity.  Monitor BP at home.  Follow-up in 5 weeks      Relevant Medications   indapamide (LOZOL) 1.25 MG tablet     Endocrine   Diabetes mellitus type 2 in obese [facility])    Patient reports history of elevated A1c above 6.5%.  Discussed that her diabetes has been well-controlled and in prediabetes range since establishing care with me.  She does have a history of weight loss surgery.  Most recent A1c 6.4%.  Mounjaro prescribed.  Counseling on how  to take the medication and potential side effects.        Other   Gout    She will start back on allopurinol.  Discussed that this medication is meant to reduce uric acid level and not for acute flareups.  She has colchicine for acute flareups.  Counseling on low purine diet and handout provided.      Relevant Medications   allopurinol (ZYLOPRIM) 300 MG tablet   Hypercholesterolemia with LDL greater than 190 mg/dL    Started back on statin therapy.  Discussed that her LDL was 228 and this places her at a higher risk of heart disease, heart attack and stroke.  Follow-up in 5 weeks for fasting lipids and office visit.      Relevant Medications   indapamide (LOZOL) 1.25 MG tablet   Morbid obesity ([place])    I am having Janice Huang maintain her clotrimazole-betamethasone, atorvastatin, colchicine, Athletes Foot (Clotrimazole), meloxicam, methocarbamol, allopurinol, and indapamide.  Meds ordered this encounter  Medications   allopurinol (ZYLOPRIM) 300 MG tablet    Sig: Take 1 tablet (300 mg total) by mouth daily.    Dispense:  90 tablet    Refill:  1   DISCONTD: tirzepatide (MOUNJARO) 2.5 MG/0.5ML Pen    Sig: Inject 2.5 mg into the skin once a week.    Dispense:  2 mL    Refill:  0    Order Specific Question:   Supervising Provider    Answer:   Pricilla Holm A [id number]   indapamide (LOZOL) 1.25 MG tablet    Sig: TAKE 1 TABLET(1.25 MG) BY MOUTH DAILY    Dispense:  90 tablet    Refill:  1    Order Specific Question:   Supervising Provider    Answer:   Pricilla Holm A [id number]

## 2022-07-19 NOTE — Patient Instructions (Signed)
Low-Purine Eating Plan A low-purine eating plan involves making food choices to limit your purine intake. Purine is a kind of uric acid. Too much uric acid in your blood can cause certain conditions, such as gout and kidney stones. Eating a low-purine diet may help control these conditions. What are tips for following this plan? Shopping Avoid buying products that contain high-fructose corn syrup. Check for this on food labels. It is commonly found in many processed foods and soft drinks. Be sure to check for it in baked goods such as cookies, canned fruits, and cereals and cereal bars. Avoid buying veal, chicken breast with skin, lamb, and organ meats such as liver. These types of meats tend to have the highest purine content. Choose dairy products. These may lower uric acid levels. Avoid certain types of fish. Not all fish and seafood have high purine content. Examples with high purine content include anchovies, trout, tuna, sardines, and salmon. Avoid buying beverages that contain alcohol, particularly beer and hard liquor. Alcohol can affect the way your body gets rid of uric acid. Meal planning  Learn which foods do or do not affect you. If you find out that a food tends to cause your gout symptoms to flare up, avoid eating that food. You can enjoy foods that do not cause problems. If you have any questions about a food item, talk with your dietitian or health care provider. Reduce the overall amount of meat in your diet. When you do eat meat, choose ones with lower purine content. Include plenty of fruits and vegetables. Although some vegetables may have a high purine content--such as asparagus, mushrooms, spinach, or cauliflower--it has been shown that these do not contribute to uric acid blood levels as much. Consume at least 1 dairy serving a day. This has been shown to decrease uric acid levels. General information If you drink alcohol: Limit how much you have to: 0-1 drink a day for  women who are not pregnant. 0-2 drinks a day for men. Know how much alcohol is in a drink. In the [place] one drink equals one 12 oz bottle of beer (355 mL), one 5 oz glass of wine (148 mL), or one 1 oz glass of hard liquor (44 mL). Drink plenty of water. Try to drink enough to keep your urine pale yellow. Fluids can help remove uric acid from your body. Work with your health care provider and dietitian to develop a plan to achieve or maintain a healthy weight. Losing weight may help reduce uric acid in your blood. What foods are recommended? The following are some types of foods that are good choices when limiting purine intake: Fresh or frozen fruits and vegetables. Whole grains, breads, cereals, and pasta. Rice. Beans, peas, legumes. Nuts and seeds. Dairy products. Fats and oils. The items listed above may not be a complete list. Talk with a dietitian about what dietary choices are best for you. What foods are not recommended? Limit your intake of foods high in purines, including: Beer and other alcohol. Meat-based gravy or sauce. Canned or fresh fish, such as: Anchovies, sardines, herring, salmon, and tuna. Mussels and scallops. Codfish, trout, and haddock. Bacon, veal, chicken breast with skin, and lamb. Organ meats, such as: Liver or kidney. Tripe. Sweetbreads (thymus gland or pancreas). Wild game or goose. Yeast or yeast extract supplements. Drinks sweetened with high-fructose corn syrup, such as soda. Processed foods made with high-fructose corn syrup. The items listed above may not be a complete list of foods   and beverages you should limit. Contact a dietitian for more information. Summary Eating a low-purine diet may help control conditions caused by too much uric acid in the body, such as gout or kidney stones. Choose low-purine foods, limit alcohol, and limit high-fructose corn syrup. You will learn over time which foods do or do not affect you. If you find out that a  food tends to cause your gout symptoms to flare up, avoid eating that food. This information is not intended to replace advice given to you by your health care provider. Make sure you discuss any questions you have with your health care provider. Document Revised: 04/21/2021 Document Reviewed: 04/21/2021 Elsevier Patient Education  2023 Elsevier Inc.  

## 2022-07-20 ENCOUNTER — Telehealth: Payer: Self-pay | Admitting: Family Medicine

## 2022-07-20 NOTE — Telephone Encounter (Signed)
MEDICATION:tirzepatide (MOUNJARO) 2.5 MG/0.5ML Pen   PHARMACY:[facility] [id number] - [place], [place] - [address] AT [address]   Comments: Prescription insurance is through [employer]  **Let patient know to contact pharmacy at the end of the day to make sure medication is ready. **  ** Please notify patient to allow 48-72 hours to process**  **Encourage patient to contact the pharmacy for refills or they can request refills through [facility]**

## 2022-07-21 ENCOUNTER — Other Ambulatory Visit: Payer: Self-pay | Admitting: Family Medicine

## 2022-07-21 ENCOUNTER — Telehealth: Payer: Self-pay | Admitting: Family Medicine

## 2022-07-21 DIAGNOSIS — E669 Obesity, unspecified: Secondary | ICD-10-CM

## 2022-07-21 MED ORDER — TIRZEPATIDE 2.5 MG/0.5ML ~~LOC~~ SOAJ: 2.5000 mg | SUBCUTANEOUS | 0 refills | Status: DC

## 2022-07-21 MED ORDER — ZEPBOUND 2.5 MG/0.5ML ~~LOC~~ SOAJ: 2.5000 mg | SUBCUTANEOUS | 1 refills | Status: DC

## 2022-07-21 NOTE — Telephone Encounter (Signed)
Pt called abot her Rx   tirzepatide (MOUNJARO) 2.5 MG/0.5ML Pen.   pt states that [place] wrote her Rx for a 30 day supply and it is $500 dollars. She is requesting to have a 90 day supply for 500.00 instead because she can not  afford Rx for 1 month for that amount stated above.   Please call pt back with update [phone]

## 2022-07-21 NOTE — Telephone Encounter (Signed)
Pharmacy Patient Advocate Encounter  Received notification from [facility] that the request for prior authorization for Janice Huang has been denied due to .    Please be advised we currently do not have a Pharmacist to review denials, therefore you will need to process appeals accordingly as needed. Thanks for your support at this time.

## 2022-07-21 NOTE — Telephone Encounter (Signed)
PA has been denied. Patient does not have type 2 diabetes anymore, prediabetic. Zepbound is next option per PCP. Called pt and informed her of information and she is ok with starting Zepbound, please send in starting dose.

## 2022-07-21 NOTE — Telephone Encounter (Signed)
New rx sent

## 2022-07-21 NOTE — Telephone Encounter (Signed)
According to her chart she does not have type 2 diabetes, just prediabetes. Is this correct?

## 2022-07-21 NOTE — Addendum Note (Signed)
Addended by: Rossie Muskrat on: 07/21/2022 11:07 AM   Modules accepted: Orders

## 2022-07-21 NOTE — Telephone Encounter (Signed)
Patient called and said prior authorization needs to be marked as Type 2 diabetes for her medication to be approved. She said it was marked as other. She gave the phone number for the [facility] prior authorization team. It is [phone]. Best callback for patient is [phone].

## 2022-07-21 NOTE — Telephone Encounter (Signed)
New PA done Key: [facility]

## 2022-07-21 NOTE — Telephone Encounter (Signed)
PA started  Key: [id number]

## 2022-07-24 DIAGNOSIS — E1169 Type 2 diabetes mellitus with other specified complication: Secondary | ICD-10-CM | POA: Insufficient documentation

## 2022-07-24 NOTE — Assessment & Plan Note (Signed)
She will start back on allopurinol.  Discussed that this medication is meant to reduce uric acid level and not for acute flareups.  She has colchicine for acute flareups.  Counseling on low purine diet and handout provided.

## 2022-07-24 NOTE — Telephone Encounter (Addendum)
PA has been denied. Would you like to try any of the alternatives below? Either non-glucagon-like peptide-1 receptor or saxenda/wegovy?  The denial was based on our criteria for Zepbound Inj 2.'5mg'$ .  Per your health plan's criteria, this drug is covered if you meet the following: (1) Your doctor provides medical records (for example: chart notes) showing both of the following: (A) You have tried a lifestyle change that promotes a reduced calorie diet and exercise of at least 150 minutes per week for at least 6 months and you have failed to achieve adequate weight loss. (B) You will be using Zepbound with continued lifestyle change that promotes a reduced calorie diet and exercise of at least 150 minutes per week. (2) One of the following: (A) Both of the following: --(I) There are paid claims or your doctor provides medical records (for example: chart notes) showing you have tried for at least 3 months or cannot use two non-glucagon-like peptide-1 receptor agonist weight loss drugs (for example: Contrave, Qsymia). (II) There are paid claims or your doctor provides medical records (for example: chart notes) showing you have tried and did not respond well to both of the following: --(a) At least a 43-monthtrial of Saxenda, with you being on the maintenance dose of '3mg'$  per day for at least 3 months of the trial. --(b) At least a 729-monthrial of Wegovy, with you being on the maintenance dose of 1.'7mg'$  or 2.'4mg'$  per week for at least 3 months of the trial. (B) There are no formulary drugs available to treat your condition. The information provided does not show that you meet the criteria listed above. Please speak with your doctor about your choices. Reviewed by: [email or web address], R.Ph. **Please note: The drug(s) listed above may require additional review.

## 2022-07-24 NOTE — Assessment & Plan Note (Signed)
Patient reports history of elevated A1c above 6.5%.  Discussed that her diabetes has been well-controlled and in prediabetes range since establishing care with me.  She does have a history of weight loss surgery.  Most recent A1c 6.4%.  Mounjaro prescribed.  Counseling on how to take the medication and potential side effects.

## 2022-07-24 NOTE — Assessment & Plan Note (Signed)
BP close to goal range.  Continue indapamide.  Recommend low-sodium diet and increasing physical activity.  Monitor BP at home.  Follow-up in 5 weeks

## 2022-07-24 NOTE — Assessment & Plan Note (Signed)
Started back on statin therapy.  Discussed that her LDL was 228 and this places her at a higher risk of heart disease, heart attack and stroke.  Follow-up in 5 weeks for fasting lipids and office visit.

## 2022-07-24 NOTE — Telephone Encounter (Signed)
PA started for Zepbound  Key: [id number]

## 2022-07-25 ENCOUNTER — Other Ambulatory Visit: Payer: Self-pay | Admitting: Family Medicine

## 2022-07-25 ENCOUNTER — Telehealth: Payer: Self-pay

## 2022-07-25 MED ORDER — WEGOVY 0.25 MG/0.5ML ~~LOC~~ SOAJ: 0.2500 mg | SUBCUTANEOUS | 0 refills | Status: DC

## 2022-07-25 NOTE — Telephone Encounter (Signed)
PA started for [facility]  Key: [id number]

## 2022-07-25 NOTE — Telephone Encounter (Signed)
Please send in starting dose of Wegovy, pt is ok with this

## 2022-07-25 NOTE — Telephone Encounter (Signed)
PA has been APPROVED through 02/24/2023

## 2022-07-25 NOTE — Telephone Encounter (Signed)
PA has been approved, called and notified pt

## 2022-07-25 NOTE — Telephone Encounter (Signed)
Patient called back and said that she is fine with whatever you can get approved - please let patient know.

## 2022-07-25 NOTE — Telephone Encounter (Signed)
PA started for [facility]

## 2022-07-26 ENCOUNTER — Encounter: Payer: Self-pay | Admitting: Gastroenterology

## 2022-08-08 ENCOUNTER — Telehealth: Payer: Self-pay | Admitting: Family Medicine

## 2022-08-08 NOTE — Telephone Encounter (Signed)
Pt reports since taking the wegovy she has been experiencing flu like symptoms and wants to know if she should move out her 4/3 appt as well

## 2022-08-08 NOTE — Telephone Encounter (Signed)
Patient called and states that since taking the wygovey she is experiencing flu like symptoms.  She also wants to know if she should move her appointment out further.  Please advise.

## 2022-08-08 NOTE — Telephone Encounter (Signed)
Called pt for clarification and she reports after staring the medication on Sunday afternoon she began to feel sick that evening. She reports flu like symptoms such as congestion and sore throat and wants to know if this is a normal side effect? She also reports the day she injected herself in the leg, the injection site puffed up and later went away which she thought was normal.

## 2022-08-09 NOTE — Telephone Encounter (Signed)
Called pt and let her know these are not common symptoms and to schedule ov if symptoms worsen. Pt reports she has started to feel better since then but will let us know if it continues.

## 2022-08-11 ENCOUNTER — Encounter: Payer: Managed Care, Other (non HMO) | Admitting: Gastroenterology

## 2022-08-23 ENCOUNTER — Ambulatory Visit: Payer: Managed Care, Other (non HMO) | Admitting: Family Medicine

## 2022-08-25 ENCOUNTER — Encounter: Payer: Self-pay | Admitting: Gastroenterology

## 2022-08-25 ENCOUNTER — Telehealth: Payer: Self-pay | Admitting: Family Medicine

## 2022-08-25 ENCOUNTER — Ambulatory Visit: Payer: Managed Care, Other (non HMO) | Admitting: Gastroenterology

## 2022-08-25 VITALS — Temp 97.5°F | Ht 67.5 in | Wt 298.0 lb

## 2022-08-25 DIAGNOSIS — Z1211 Encounter for screening for malignant neoplasm of colon: Secondary | ICD-10-CM

## 2022-08-25 MED ORDER — SODIUM CHLORIDE 0.9 % IV SOLN: 500.0000 mL | INTRAVENOUS | Status: AC

## 2022-08-25 NOTE — Progress Notes (Signed)
Patient reports starting Wegovy 3 weeks ago, last dose 4 days ago.  Dr Myrtie Neither notified, procedure cancelled.  Patient voiced understanding and will request Cologuard from her PCP

## 2022-08-25 NOTE — Addendum Note (Signed)
Addended by: Marinus Maw on: 08/25/2022 11:25 AM   Modules accepted: Orders

## 2022-08-25 NOTE — Telephone Encounter (Signed)
Pt called stating she had an colon appt but they can't do it because she just took wegovy this week. Pt wants to know is she can do the cologuard (at Home). Please give pt a call back with update.

## 2022-09-06 ENCOUNTER — Encounter: Payer: Self-pay | Admitting: Family Medicine

## 2022-09-06 ENCOUNTER — Ambulatory Visit: Payer: Managed Care, Other (non HMO) | Admitting: Family Medicine

## 2022-09-06 DIAGNOSIS — M1000 Idiopathic gout, unspecified site: Secondary | ICD-10-CM | POA: Diagnosis not present

## 2022-09-06 DIAGNOSIS — E7800 Pure hypercholesterolemia, unspecified: Secondary | ICD-10-CM | POA: Diagnosis not present

## 2022-09-06 MED ORDER — WEGOVY 0.5 MG/0.5ML ~~LOC~~ SOAJ
0.5000 mg | SUBCUTANEOUS | 1 refills | Status: DC
Start: 2022-09-06 — End: 2022-09-06

## 2022-09-06 MED ORDER — WEGOVY 0.5 MG/0.5ML ~~LOC~~ SOAJ
0.5000 mg | SUBCUTANEOUS | 1 refills | Status: DC
Start: 2022-09-06 — End: 2022-12-08

## 2022-09-06 NOTE — Patient Instructions (Signed)
Please get fasting labs at [facility] after being on the atorvastatin for at least 6 weeks.   Follow up here in 3 months.

## 2022-09-06 NOTE — Progress Notes (Signed)
Subjective:     Patient ID: Janice Huang, female    DOB: December 13, 1970, 52 y.o.   MRN: [id number]  Chief Complaint  Patient presents with   Hypertension    F/u   Weight Loss    F/u wegovy, has taken 4 doses    HPI Patient is in today for follow up on obesity and starting on Wegovy. Doing well. Lost 5 lbs. No side effects.   LDL 228- started atorvastatin 4-5 wks ago. She is not fasting today.   Gout- taking allopurinol 300 mg daily      Health Maintenance Due  Topic Date Due   COLONOSCOPY (Pts 45-33yrs Insurance coverage will need to be confirmed)  Never done   OPHTHALMOLOGY EXAM  05/10/2017   FOOT EXAM  07/24/2019    Past Medical History:  Diagnosis Date   Gout attack    last flare up last week, in toes   Headache    History of hiatal hernia    fixed with weight loss surgery   Hyperlipidemia    Hypertension    Sleep apnea    prior to weight loss surgery    Past Surgical History:  Procedure Laterality Date    c section x 2     ABDOMINAL HYSTERECTOMY     1 ovary removed   BREAST BIOPSY Right 02/23/2020   fibroadenoma   LAPAROSCOPIC GASTRIC SLEEVE RESECTION N/A 09/21/2014   Procedure: LAPAROSCOPIC GASTRIC SLEEVE RESECTION WITH HIATAL HERNIA REPAIR;  Surgeon: Luretha Murphy, MD;  Location: [facility];  Service: General;  Laterality: N/A;   TUBAL LIGATION      Family History  Problem Relation Age of Onset   Diabetes Maternal Grandmother    Hypertension Maternal Grandfather    Diabetes Paternal Grandmother    Hypertension Other    Diabetes Other        type 2   Cancer Neg Hx    Heart disease Neg Hx    Hyperlipidemia Neg Hx    Stroke Neg Hx    Breast cancer Neg Hx    Colon cancer Neg Hx    Colon polyps Neg Hx    Esophageal cancer Neg Hx    Rectal cancer Neg Hx    Stomach cancer Neg Hx     Social History   Socioeconomic History   Marital status: Divorced    Spouse name: Not on file   Number of children: Not on file   Years of  education: Not on file   Highest education level: Associate degree: occupational, Scientist, product/process development, or vocational program  Occupational History   Not on file  Tobacco Use   Smoking status: Never   Smokeless tobacco: Never  Vaping Use   Vaping Use: Never used  Substance and Sexual Activity   Alcohol use: No    Alcohol/week: 0.0 standard drinks of alcohol   Drug use: No   Sexual activity: Yes    Partners: Male    Birth control/protection: Surgical    Comment: hysterectomy  Other Topics Concern   Not on file  Social History Narrative   Regular exercise-No   Children: 3- 2 boys and a girl   Caffienated drinks-no   Seat belt use often-yes   Regular Exercise-no   Smoke alarm in the home-yes   Firearms/guns in the home-no   History of physical abuse-no               Social Determinants of Health   Financial Resource Strain: Low Risk  (  09/05/2022)   Overall Financial Resource Strain (CARDIA)    Difficulty of Paying Living Expenses: Not hard at all  Food Insecurity: No Food Insecurity (09/05/2022)   Hunger Vital Sign    Worried About Running Out of Food in the Last Year: Never true    Ran Out of Food in the Last Year: Never true  Transportation Needs: No Transportation Needs (09/05/2022)   PRAPARE - Administrator, Civil Service (Medical): No    Lack of Transportation (Non-Medical): No  Physical Activity: Sufficiently Active (09/05/2022)   Exercise Vital Sign    Days of Exercise per Week: 2 days    Minutes of Exercise per Session: 150+ min  Stress: No Stress Concern Present (09/05/2022)   [employer] of Occupational Health - Occupational Stress Questionnaire    Feeling of Stress : Not at all  Social Connections: Socially Integrated (09/05/2022)   Social Connection and Isolation Panel [NHANES]    Frequency of Communication with Friends and Family: More than three times a week    Frequency of Social Gatherings with Friends and Family: More than three times a week     Attends Religious Services: 1 to 4 times per year    Active Member of [employer] or Organizations: Yes    Attends Banker Meetings: 1 to 4 times per year    Marital Status: Living with partner  Intimate Partner Violence: Not on file    Outpatient Medications Prior to Visit  Medication Sig Dispense Refill   allopurinol (ZYLOPRIM) 300 MG tablet Take 1 tablet (300 mg total) by mouth daily. 90 tablet 1   ATHLETES FOOT, CLOTRIMAZOLE, 1 % cream Apply topically.     atorvastatin (LIPITOR) 20 MG tablet Take 1 tablet (20 mg total) by mouth daily. 90 tablet 1   clotrimazole-betamethasone (LOTRISONE) cream Apply 1 Application topically daily. 30 g 0   colchicine 0.6 MG tablet Take 1 tablet (0.6 mg total) by mouth 2 (two) times daily. 180 tablet 0   indapamide (LOZOL) 1.25 MG tablet TAKE 1 TABLET(1.25 MG) BY MOUTH DAILY 90 tablet 1   meloxicam (MOBIC) 15 MG tablet Take 15 mg by mouth daily.     methocarbamol (ROBAXIN) 500 MG tablet Take 1,000 mg by mouth 4 (four) times daily as needed for muscle spasms.     Semaglutide-Weight Management (WEGOVY) 0.25 MG/0.5ML SOAJ Inject 0.25 mg into the skin once a week. 2 mL 0   Facility-Administered Medications Prior to Visit  Medication Dose Route Frequency Provider Last Rate Last Admin   0.9 %  sodium chloride infusion  500 mL Intravenous Continuous Danis, Starr Lake III, MD        Allergies  Allergen Reactions   Lisinopril Cough   Codeine     REACTION: itching and hives   Latex Dermatitis    Irritation with latex condoms   Penicillins Itching    Review of Systems  Constitutional:  Negative for chills and fever.  Respiratory:  Negative for shortness of breath.   Cardiovascular:  Negative for chest pain, palpitations and leg swelling.  Gastrointestinal:  Negative for abdominal pain, constipation, diarrhea, nausea and vomiting.  Genitourinary:  Negative for dysuria, frequency and urgency.  Neurological:  Negative for dizziness.        Objective:    Physical Exam Constitutional:      General: She is not in acute distress.    Appearance: She is not ill-appearing.  Eyes:     Extraocular Movements: Extraocular movements intact.  Conjunctiva/sclera: Conjunctivae normal.  Cardiovascular:     Rate and Rhythm: Normal rate.  Pulmonary:     Effort: Pulmonary effort is normal.  Musculoskeletal:     Cervical back: Normal range of motion and neck supple.  Skin:    General: Skin is warm and dry.  Neurological:     General: No focal deficit present.     Mental Status: She is alert and oriented to person, place, and time.  Psychiatric:        Mood and Affect: Mood normal.        Behavior: Behavior normal.        Thought Content: Thought content normal.     BP 136/78 (BP Location: Left Arm, Patient Position: Sitting, Cuff Size: Large)   Pulse 80   Temp 97.8 F (36.6 C) (Temporal)   Ht 5' 7.5" (1.715 m)   Wt 293 lb (132.9 kg)   SpO2 98%   BMI 45.21 kg/m  Wt Readings from Last 3 Encounters:  09/06/22 293 lb (132.9 kg)  08/25/22 298 lb (135.2 kg)  07/19/22 298 lb (135.2 kg)       Assessment & Plan:   Problem List Items Addressed This Visit       Other   Gout   Relevant Orders   Uric acid   Hypercholesterolemia with LDL greater than 190 mg/dL   Relevant Orders   Lipid panel   Morbid obesity - Primary   Relevant Medications   Semaglutide-Weight Management (WEGOVY) 0.5 MG/0.5ML SOAJ   She will go to [facility] for fasting lipids and recheck uric acid.  Increase Wegovy dose to 0.5 mg weekly. She has lost weight and is not having any side effects.  LDL very high. Recheck lipids after being on statin for 6 wks.  Follow up in 3 months or sooner if needed.   I am having Jazlin B. Schillaci maintain her clotrimazole-betamethasone, atorvastatin, colchicine, Athletes Foot (Clotrimazole), meloxicam, methocarbamol, allopurinol, indapamide, and Wegovy. We will continue to administer sodium chloride.  Meds  ordered this encounter  Medications   DISCONTD: Semaglutide-Weight Management (WEGOVY) 0.5 MG/0.5ML SOAJ    Sig: Inject 0.5 mg into the skin once a week.    Dispense:  2 mL    Refill:  1    Order Specific Question:   Supervising Provider    Answer:   Hillard Danker A [[id number]]   Semaglutide-Weight Management (WEGOVY) 0.5 MG/0.5ML SOAJ    Sig: Inject 0.5 mg into the skin once a week.    Dispense:  2 mL    Refill:  1    Order Specific Question:   Supervising Provider    Answer:   Hillard Danker A [[id number]]

## 2022-09-18 NOTE — Telephone Encounter (Signed)
Address in file was incorrect, needed to reorder cologuard

## 2022-09-18 NOTE — Addendum Note (Signed)
Addended by: Marinus Maw on: 09/18/2022 01:17 PM   Modules accepted: Orders

## 2022-10-09 ENCOUNTER — Telehealth: Payer: Self-pay | Admitting: Family Medicine

## 2022-10-09 DIAGNOSIS — Z1211 Encounter for screening for malignant neoplasm of colon: Secondary | ICD-10-CM

## 2022-10-09 NOTE — Telephone Encounter (Signed)
Pt reports her insurance is telling her she cannot get any weight loss medications w/o starting on weight watchers. Pt states because her android is old she cannot download the app and cannot get on her work computer for this since it is  not considered work but she will call her benefits people tomorrow and see what can be done and will update [place].   Reordering cologuard, unsure what happened.

## 2022-10-09 NOTE — Telephone Encounter (Signed)
PA for Wegovy increase started  Key: [employer]

## 2022-10-09 NOTE — Telephone Encounter (Signed)
Patient called and still has not received her cologuard kit and her pharmacy is requesting a prior authorization be done on her wygovey.  Please let patient know when this has been completred.  Phone:  [phone]

## 2022-10-09 NOTE — Addendum Note (Signed)
Addended by: Marinus Maw on: 10/09/2022 11:20 AM   Modules accepted: Orders

## 2022-10-17 NOTE — Telephone Encounter (Signed)
Pt call back stating she still havent received her colongaurd in the mail. Pt was also inquiring about do she need to make appt for wegovy advise pt she has app coming up in July. Pt was just making sure she don't go without her medication.

## 2022-10-23 ENCOUNTER — Telehealth: Payer: Self-pay | Admitting: Family Medicine

## 2022-10-23 DIAGNOSIS — G8929 Other chronic pain: Secondary | ICD-10-CM

## 2022-10-23 NOTE — Telephone Encounter (Signed)
Patient has been seen multiple times for earaches. She wanted to know if PCP thinks it may be time to refer patient to an ENT. Patient said her only preference is within [facility] that accepts her insurance. Best callback is [phone].

## 2022-10-23 NOTE — Telephone Encounter (Signed)
Patient said she still has not received her Cologuard. She would like to know if it would be easier to have her pick it up from the office. Best callback is [phone].

## 2022-10-23 NOTE — Telephone Encounter (Signed)
Ok for ent referral 

## 2022-10-24 NOTE — Addendum Note (Signed)
Addended by: Marinus Maw on: 10/24/2022 09:04 AM   Modules accepted: Orders

## 2022-10-24 NOTE — Addendum Note (Signed)
Addended by: Marinus Maw on: 10/24/2022 02:43 PM   Modules accepted: Orders

## 2022-10-24 NOTE — Telephone Encounter (Signed)
LM for pt advising of ENT referral placed as well as info on cologuard order. Stated I contacted [facility] and they did not seem to have her order so we have reordered it as well as faxed them a copy of the orders. Received confirmation fax.

## 2022-10-24 NOTE — Telephone Encounter (Signed)
Referral placed.

## 2022-10-24 NOTE — Telephone Encounter (Signed)
LM for pt referral placed

## 2022-10-27 ENCOUNTER — Telehealth: Payer: Self-pay | Admitting: Family Medicine

## 2022-10-27 NOTE — Telephone Encounter (Signed)
[phone]  Patient wants to know if she can still do the cologuard test since she is on wygovey?  They would not do her colonoscopy because she was on this medication.  Please advise

## 2022-10-27 NOTE — Telephone Encounter (Signed)
Called and relayed Janice Huang has advised she does not need to stop the wegovy to do the cologuard but if she was to do colonoscopy she would have to stop it for 2 weeks. Pt verbalized understanding and states she just got the cologuard kit and just wanted to make sure before starting it

## 2022-11-11 LAB — COLOGUARD: COLOGUARD: NEGATIVE

## 2022-11-13 NOTE — Progress Notes (Signed)
Negative Cologuard test. She will be due for a repeat test in 3 years per the current guidelines.

## 2022-11-29 ENCOUNTER — Telehealth: Payer: Self-pay | Admitting: Family Medicine

## 2022-11-29 NOTE — Addendum Note (Signed)
Addended by: Levonne Lapping on: 11/29/2022 04:04 PM   Modules accepted: Orders

## 2022-11-29 NOTE — Telephone Encounter (Signed)
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 She is aware the orders are already in, but she wanted to know if it needs to be faxed over to [facility]. Their fax number is [phone]. Best callback is [phone].

## 2022-11-29 NOTE — Telephone Encounter (Signed)
I have released lab to [facility] per patient request

## 2022-11-29 NOTE — Telephone Encounter (Signed)
Please advise if this ok to release to [facility] per pt request?

## 2022-11-29 NOTE — Telephone Encounter (Signed)
Patient called back and said that the orders needs to be released so she can get the lab work done.  Please call patient at [phone] when this is done

## 2022-11-30 ENCOUNTER — Other Ambulatory Visit: Payer: Self-pay | Admitting: Family Medicine

## 2022-11-30 NOTE — Telephone Encounter (Signed)
Patient said [facility] has not received the orders. She would like a call when they are sent, otherwise she may need to reschedule her appointment. [facility]'s fax number is [phone]. Best callback is [phone].

## 2022-11-30 NOTE — Telephone Encounter (Signed)
Lab orders faxed to number provided, confirmation fax received. Called and notified pt who verbalized understanding

## 2022-12-01 LAB — URIC ACID: Uric Acid: 8.9 mg/dL — ABNORMAL HIGH (ref 3.0–7.2)

## 2022-12-01 LAB — LIPID PANEL
Chol/HDL Ratio: 4 ratio (ref 0.0–4.4)
Cholesterol, Total: 210 mg/dL — ABNORMAL HIGH (ref 100–199)
HDL: 53 mg/dL (ref >39)
LDL Chol Calc (NIH): 136 mg/dL — ABNORMAL HIGH (ref 0–99)
Triglycerides: 117 mg/dL (ref 0–149)
VLDL Cholesterol Cal: 21 mg/dL (ref 5–40)

## 2022-12-06 ENCOUNTER — Ambulatory Visit: Payer: Managed Care, Other (non HMO) | Admitting: Family Medicine

## 2022-12-08 ENCOUNTER — Telehealth: Payer: Self-pay | Admitting: Family Medicine

## 2022-12-08 MED ORDER — WEGOVY 0.5 MG/0.5ML ~~LOC~~ SOAJ
0.5000 mg | SUBCUTANEOUS | 0 refills | Status: DC
Start: 2022-12-08 — End: 2022-12-13

## 2022-12-08 NOTE — Telephone Encounter (Signed)
Prescription Request  12/08/2022  LOV: 09/06/2022  What is the name of the medication or equipment? Semaglutide-Weight Management (WEGOVY) 0.5 MG/0.5ML   Have you contacted your pharmacy to request a refill? No  Which pharmacy would you like this sent to?  [id number] [place], [place] - [address] [place] [address] Phone: [phone] Fax: [phone]    Patient notified that their request is being sent to the clinical staff for review and that they should receive a response within 2 business days.   Please advise at Mobile [phone] (mobile)

## 2022-12-08 NOTE — Telephone Encounter (Signed)
Sent 1 month supply until appt.Marland KitchenRaechel Huang

## 2022-12-13 MED ORDER — WEGOVY 0.5 MG/0.5ML ~~LOC~~ SOAJ: 0.5000 mg | SUBCUTANEOUS | 0 refills | Status: DC

## 2022-12-13 NOTE — Telephone Encounter (Signed)
Patient wants it sent to [facility] on [address]

## 2022-12-13 NOTE — Addendum Note (Signed)
Addended by: Marinus Maw on: 12/13/2022 09:33 AM   Modules accepted: Orders

## 2022-12-13 NOTE — Telephone Encounter (Signed)
Rx resent to [facility]

## 2022-12-18 ENCOUNTER — Ambulatory Visit: Payer: Managed Care, Other (non HMO) | Admitting: Obstetrics & Gynecology

## 2022-12-19 ENCOUNTER — Ambulatory Visit (INDEPENDENT_AMBULATORY_CARE_PROVIDER_SITE_OTHER): Payer: Managed Care, Other (non HMO) | Admitting: Radiology

## 2022-12-19 ENCOUNTER — Encounter: Payer: Self-pay | Admitting: Radiology

## 2022-12-19 VITALS — BP 128/82 | Ht 67.5 in | Wt 277.0 lb

## 2022-12-19 DIAGNOSIS — Z01419 Encounter for gynecological examination (general) (routine) without abnormal findings: Secondary | ICD-10-CM | POA: Diagnosis not present

## 2022-12-19 DIAGNOSIS — N952 Postmenopausal atrophic vaginitis: Secondary | ICD-10-CM

## 2022-12-19 MED ORDER — ESTRADIOL 10 MCG VA TABS
1.0000 | ORAL_TABLET | VAGINAL | 11 refills | Status: DC
Start: 2022-12-21 — End: 2023-12-25

## 2022-12-19 NOTE — Progress Notes (Signed)
   Janice Huang [birth year] [id number]   History:  52 y.o. G4P3 presents for annual exam. C/o vaginal dryness and occasional bleeding after intercourse. On wegovy for weight loss, has lost 30lbs.  Gynecologic History Hysterectomy: fibroids  Sexually active: yes  Health Maintenance Last Pap: 2022. Results were: normal Last mammogram: 03/06/22. Results were: normal Last colonoscopy: cologuard 2024. Results were: normal   Past medical history, past surgical history, family history and social history were all reviewed and documented in the EPIC chart.  ROS:  A ROS was performed and pertinent positives and negatives are included.  Exam:  Vitals:   12/19/22 1432  BP: 128/82  Weight: 277 lb (125.6 kg)  Height: 5' 7.5" (1.715 m)   Body mass index is 42.74 kg/m.  General appearance:  Normal, obese Thyroid:  Symmetrical, normal in size, without palpable masses or nodularity. Respiratory  Auscultation:  Clear without wheezing or rhonchi Cardiovascular  Auscultation:  Regular rate, without rubs, murmurs or gallops  Edema/varicosities:  Not grossly evident Abdominal  Soft,nontender, without masses, guarding or rebound.  Liver/spleen:  No organomegaly noted  Hernia:  None appreciated  Skin  Inspection:  Grossly normal Breasts: Examined lying and sitting.   Right: Without masses, retractions, nipple discharge or axillary adenopathy.   Left: Without masses, retractions, nipple discharge or axillary adenopathy. Genitourinary   Inguinal/mons:  Normal without inguinal adenopathy  External genitalia:  Normal appearing vulva with no masses, tenderness, or lesions  BUS/Urethra/Skene's glands:  Normal  Vagina:  Normal appearing with normal color and discharge, no lesions. Atrophy moderate  Cervix:  absent  Uterus:  absent  Adnexa/parametria:     Rt: Normal in size, without masses or tenderness.   Lt: absent  Anus and perineum: Normal   Raynelle Fanning, CMA present for  exam  Assessment/Plan:   1. Well woman exam with routine gynecological exam Pap up to date  2. Vaginal atrophy - Estradiol (YUVAFEM) 10 MCG TABS vaginal tablet; Place 1 tablet (10 mcg total) vaginally 2 (two) times a week.  Dispense: 8 tablet; Refill: 11     Discussed SBE, colonoscopy and DEXA screening as appropriate. Encouraged 125mins/week of cardiovascular and weight bearing exercise minimum. Recommend the use of seatbelts and sunscreen consistently.   Return in 1 year for annual or sooner prn.  Arlie Solomons B WHNP-BC 2:51 PM 12/19/2022

## 2022-12-20 ENCOUNTER — Other Ambulatory Visit: Payer: Self-pay | Admitting: Family Medicine

## 2022-12-20 DIAGNOSIS — Z1231 Encounter for screening mammogram for malignant neoplasm of breast: Secondary | ICD-10-CM

## 2022-12-29 ENCOUNTER — Ambulatory Visit (INDEPENDENT_AMBULATORY_CARE_PROVIDER_SITE_OTHER): Payer: Managed Care, Other (non HMO) | Admitting: Family Medicine

## 2022-12-29 ENCOUNTER — Encounter: Payer: Self-pay | Admitting: Family Medicine

## 2022-12-29 VITALS — BP 130/82 | HR 80 | Temp 97.6°F | Ht 67.5 in | Wt 279.0 lb

## 2022-12-29 DIAGNOSIS — I10 Essential (primary) hypertension: Secondary | ICD-10-CM | POA: Diagnosis not present

## 2022-12-29 DIAGNOSIS — E1169 Type 2 diabetes mellitus with other specified complication: Secondary | ICD-10-CM

## 2022-12-29 DIAGNOSIS — Z6841 Body Mass Index (BMI) 40.0 and over, adult: Secondary | ICD-10-CM

## 2022-12-29 DIAGNOSIS — E7800 Pure hypercholesterolemia, unspecified: Secondary | ICD-10-CM

## 2022-12-29 DIAGNOSIS — Z7985 Long-term (current) use of injectable non-insulin antidiabetic drugs: Secondary | ICD-10-CM

## 2022-12-29 NOTE — Progress Notes (Unsigned)
Subjective:     Patient ID: Janice Huang, female    DOB: 1970-07-30, 52 y.o.   MRN: [id number]  Chief Complaint  Patient presents with   Medical Management of Chronic Issues    3 month f/u, doing well w wegovy states she has to make herself eat    HPI  Discussed the use of AI scribe software for clinical note transcription with the patient, who gave verbal consent to proceed.  History of Present Illness          Here for follow up on chronic health conditions.   DM- Last A1c 6.4% in 06/2022  Eye exam in January 2024. [place] [facility]    On statin therapy. LDL improved from 228 to 136   No gout flares.    Health Maintenance Due  Topic Date Due   OPHTHALMOLOGY EXAM  05/10/2017   FOOT EXAM  07/24/2019   Zoster Vaccines- Shingrix (1 of 2) Never done   INFLUENZA VACCINE  12/21/2022   HEMOGLOBIN A1C  12/29/2022    Past Medical History:  Diagnosis Date   Gout attack    last flare up last week, in toes   Headache    History of hiatal hernia    fixed with weight loss surgery   Hyperlipidemia    Hypertension    Sleep apnea    prior to weight loss surgery    Past Surgical History:  Procedure Laterality Date    c section x 2     ABDOMINAL HYSTERECTOMY     1 ovary removed   BREAST BIOPSY Right 02/23/2020   fibroadenoma   LAPAROSCOPIC GASTRIC SLEEVE RESECTION N/A 09/21/2014   Procedure: LAPAROSCOPIC GASTRIC SLEEVE RESECTION WITH HIATAL HERNIA REPAIR;  Surgeon: Luretha Murphy, MD;  Location: [facility];  Service: General;  Laterality: N/A;   TUBAL LIGATION      Family History  Problem Relation Age of Onset   Diabetes Maternal Grandmother    Hypertension Maternal Grandfather    Diabetes Paternal Grandmother    Hypertension Other    Diabetes Other        type 2   Cancer Neg Hx    Heart disease Neg Hx    Hyperlipidemia Neg Hx    Stroke Neg Hx    Breast cancer Neg Hx    Colon cancer Neg Hx    Colon polyps Neg Hx    Esophageal cancer Neg Hx    Rectal  cancer Neg Hx    Stomach cancer Neg Hx     Social History   Socioeconomic History   Marital status: Divorced    Spouse name: Not on file   Number of children: Not on file   Years of education: Not on file   Highest education level: Associate degree: occupational, Scientist, product/process development, or vocational program  Occupational History   Not on file  Tobacco Use   Smoking status: Never    Passive exposure: Current   Smokeless tobacco: Never  Vaping Use   Vaping status: Never Used  Substance and Sexual Activity   Alcohol use: Yes    Comment: socially   Drug use: No   Sexual activity: Yes    Partners: Male    Birth control/protection: Surgical    Comment: hysterectomy, menarche 52yo, sexual debut 52yo  Other Topics Concern   Not on file  Social History Narrative   Regular exercise-No   Children: 3- 2 boys and a girl   Caffienated drinks-no   Seat belt use  often-yes   Regular Exercise-no   Smoke alarm in the home-yes   Firearms/guns in the home-no   History of physical abuse-no               Social Determinants of Health   Financial Resource Strain: Low Risk  (09/05/2022)   Overall Financial Resource Strain (CARDIA)    Difficulty of Paying Living Expenses: Not hard at all  Food Insecurity: No Food Insecurity (09/05/2022)   Hunger Vital Sign    Worried About Running Out of Food in the Last Year: Never true    Ran Out of Food in the Last Year: Never true  Transportation Needs: No Transportation Needs (09/05/2022)   PRAPARE - Administrator, Civil Service (Medical): No    Lack of Transportation (Non-Medical): No  Physical Activity: Sufficiently Active (09/05/2022)   Exercise Vital Sign    Days of Exercise per Week: 2 days    Minutes of Exercise per Session: 150+ min  Stress: No Stress Concern Present (09/05/2022)   [employer] of Occupational Health - Occupational Stress Questionnaire    Feeling of Stress : Not at all  Social Connections: Socially Integrated  (09/05/2022)   Social Connection and Isolation Panel [NHANES]    Frequency of Communication with Friends and Family: More than three times a week    Frequency of Social Gatherings with Friends and Family: More than three times a week    Attends Religious Services: 1 to 4 times per year    Active Member of [employer] or Organizations: Yes    Attends Banker Meetings: 1 to 4 times per year    Marital Status: Living with partner  Intimate Partner Violence: Not on file    Outpatient Medications Prior to Visit  Medication Sig Dispense Refill   allopurinol (ZYLOPRIM) 300 MG tablet Take 1 tablet (300 mg total) by mouth daily. 90 tablet 1   ATHLETES FOOT, CLOTRIMAZOLE, 1 % cream Apply topically.     atorvastatin (LIPITOR) 20 MG tablet Take 1 tablet (20 mg total) by mouth daily. 90 tablet 1   clotrimazole-betamethasone (LOTRISONE) cream Apply 1 Application topically daily. 30 g 0   colchicine 0.6 MG tablet Take 1 tablet (0.6 mg total) by mouth 2 (two) times daily. 180 tablet 0   Estradiol (YUVAFEM) 10 MCG TABS vaginal tablet Place 1 tablet (10 mcg total) vaginally 2 (two) times a week. 8 tablet 11   indapamide (LOZOL) 1.25 MG tablet TAKE 1 TABLET(1.25 MG) BY MOUTH DAILY 90 tablet 1   meloxicam (MOBIC) 15 MG tablet Take 15 mg by mouth daily.     methocarbamol (ROBAXIN) 500 MG tablet Take 1,000 mg by mouth 4 (four) times daily as needed for muscle spasms.     Semaglutide-Weight Management (WEGOVY) 0.5 MG/0.5ML SOAJ Inject 0.5 mg into the skin once a week. Must keep next appt for future refills 2 mL 0   Facility-Administered Medications Prior to Visit  Medication Dose Route Frequency Provider Last Rate Last Admin   0.9 %  sodium chloride infusion  500 mL Intravenous Continuous Danis, Starr [place] III, MD        Allergies  Allergen Reactions   Lisinopril Cough   Codeine     REACTION: itching and hives   Latex Dermatitis    Irritation with latex condoms   Penicillins Itching    Review of  Systems  Constitutional:  Negative for chills and fever.  Respiratory:  Negative for shortness of breath.  Cardiovascular:  Negative for chest pain, palpitations and leg swelling.  Gastrointestinal:  Negative for abdominal pain, constipation, diarrhea, nausea and vomiting.  Genitourinary:  Negative for dysuria, frequency and urgency.  Musculoskeletal:  Negative for myalgias.  Neurological:  Negative for dizziness, focal weakness and headaches.       Objective:    Physical Exam Constitutional:      General: She is not in acute distress.    Appearance: She is not ill-appearing.  Eyes:     Extraocular Movements: Extraocular movements intact.     Conjunctiva/sclera: Conjunctivae normal.  Cardiovascular:     Rate and Rhythm: Normal rate.  Pulmonary:     Effort: Pulmonary effort is normal.  Musculoskeletal:     Cervical back: Normal range of motion and neck supple.  Skin:    General: Skin is warm and dry.  Neurological:     General: No focal deficit present.     Mental Status: She is alert and oriented to person, place, and time.  Psychiatric:        Mood and Affect: Mood normal.        Behavior: Behavior normal.        Thought Content: Thought content normal.      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 We will continue to administer sodium chloride.  No orders of the defined types were placed in this encounter.

## 2022-12-29 NOTE — Patient Instructions (Signed)
Continue your current medications.   I will be in touch with your lab results.

## 2023-01-01 NOTE — Assessment & Plan Note (Signed)
LDL improved on statin. Continue working on diet and exercise.

## 2023-01-01 NOTE — Assessment & Plan Note (Signed)
Continue Wegovy.

## 2023-01-01 NOTE — Assessment & Plan Note (Signed)
Continue current medications. Check A1c, CBC and CMP and follow up.

## 2023-01-01 NOTE — Assessment & Plan Note (Signed)
BP controlled.   Continue indapamide.  Recommend low-sodium diet and increasing physical activity.  Monitor BP at home.

## 2023-01-15 ENCOUNTER — Telehealth: Payer: Self-pay | Admitting: Family Medicine

## 2023-01-15 MED ORDER — WEGOVY 0.5 MG/0.5ML ~~LOC~~ SOAJ: 0.5000 mg | SUBCUTANEOUS | 1 refills | Status: DC

## 2023-01-15 NOTE — Telephone Encounter (Signed)
Rx sent 

## 2023-01-15 NOTE — Telephone Encounter (Signed)
Prescription Request  01/15/2023  LOV: 12/29/2022  What is the name of the medication or equipment? Semaglutide-Weight Management (WEGOVY) 0.5 MG/0.5ML SOAJ   Have you contacted your pharmacy to request a refill? Yes   Which pharmacy would you like this sent to?  [id number] Ginette Otto, [place] - [address] AT [facility] [address] [facility] [address] [place] [address] Phone: [phone] Fax: [phone]    Patient notified that their request is being sent to the clinical staff for review and that they should receive a response within 2 business days.   Please advise at Mobile [phone] (mobile)

## 2023-01-16 ENCOUNTER — Telehealth: Payer: Self-pay | Admitting: Family Medicine

## 2023-01-16 NOTE — Telephone Encounter (Signed)
Called pt and she states the only thing they have in stock is 1.7 mg and 2.4 mg, pt reports she is continuing to call other pharmacies and they are out of stock as well

## 2023-01-16 NOTE — Telephone Encounter (Signed)
Called pt to advise she needs to call around to all pharmacies, pt reports she did that already as far as [facility] and nobody has it in stock, it is backordered. She took her last dose this week and wants to know how to move forward?

## 2023-01-16 NOTE — Telephone Encounter (Signed)
Pt called and stated the pharmacy can't order the wegovy until the end of September due to the pharmacy is out of medication and pt is  completely out. Please advise.

## 2023-01-16 NOTE — Telephone Encounter (Signed)
Pt called back and reports she plans to get them done at [facility], not here.   She spoke w her pharmacy and according to them she states they told her she should have went up to next dosage by now since she has been on it this long and so she is wanting the 1.7 mg dosage sent in as they have this in stock. She wants to do this if possible.

## 2023-01-17 ENCOUNTER — Other Ambulatory Visit: Payer: Self-pay | Admitting: Family Medicine

## 2023-01-17 MED ORDER — WEGOVY 0.5 MG/0.5ML ~~LOC~~ SOAJ
0.5000 mg | SUBCUTANEOUS | 1 refills | Status: DC
Start: 2023-01-17 — End: 2023-05-08

## 2023-01-17 NOTE — Telephone Encounter (Signed)
LM for pt informing PCP response

## 2023-01-17 NOTE — Addendum Note (Signed)
Addended by: Marinus Maw on: 01/17/2023 10:36 AM   Modules accepted: Orders

## 2023-01-17 NOTE — Telephone Encounter (Signed)
Rx sent 

## 2023-01-17 NOTE — Telephone Encounter (Signed)
Patient called and said she is able to get the [facility] at the [facility] on [address]. She said she would only like it sent there for this month. Best callback is [phone].

## 2023-01-19 ENCOUNTER — Telehealth: Payer: Self-pay | Admitting: Family Medicine

## 2023-01-19 DIAGNOSIS — I10 Essential (primary) hypertension: Secondary | ICD-10-CM

## 2023-01-19 MED ORDER — INDAPAMIDE 1.25 MG PO TABS
ORAL_TABLET | ORAL | 1 refills | Status: DC
Start: 2023-01-19 — End: 2023-07-17

## 2023-01-19 NOTE — Telephone Encounter (Signed)
Prescription Request  01/19/2023  LOV: 12/29/2022  What is the name of the medication or equipment? indapamide  Have you contacted your pharmacy to request a refill? Yes   Which pharmacy would you like this sent to?   [facility] #[id number] Ginette Otto, [place] - [address] AT [facility] [address] & [facility] [address] [place] [address] Phone: [phone] Fax: [phone]    Patient notified that their request is being sent to the clinical staff for review and that they should receive a response within 2 business days.   Please advise at Mobile [phone] (mobile)

## 2023-01-19 NOTE — Telephone Encounter (Signed)
Rx sent 

## 2023-01-23 ENCOUNTER — Other Ambulatory Visit: Payer: Self-pay | Admitting: Family Medicine

## 2023-01-24 MED ORDER — ATHLETES FOOT (CLOTRIMAZOLE) 1 % EX CREA: TOPICAL_CREAM | CUTANEOUS | 0 refills | Status: DC | PRN

## 2023-01-31 ENCOUNTER — Telehealth: Payer: Self-pay | Admitting: Family Medicine

## 2023-01-31 NOTE — Telephone Encounter (Signed)
Called and spoke w pharmacist Dahlia Client and let her know this was supposed to be a PRN medication and if pt needs to continue this or anything further that is needed pt will need appt.

## 2023-01-31 NOTE — Telephone Encounter (Signed)
Patient called and states pharmacy needs to clarify how often ATHLETES FOOT, CLOTRIMAZOLE, 1 % cream  needs to be applied. Patients states pharmacy has sent it over 3x with no response. Please reach out to [facility] on [address] .

## 2023-02-14 ENCOUNTER — Other Ambulatory Visit (HOSPITAL_COMMUNITY): Payer: Self-pay

## 2023-03-08 ENCOUNTER — Ambulatory Visit
Admission: RE | Admit: 2023-03-08 | Discharge: 2023-03-08 | Disposition: A | Discharge status: Home / Self Care | Payer: Managed Care, Other (non HMO) | Source: Ambulatory Visit | Attending: Family Medicine | Admitting: Family Medicine

## 2023-03-08 DIAGNOSIS — Z1231 Encounter for screening mammogram for malignant neoplasm of breast: Secondary | ICD-10-CM

## 2023-03-13 ENCOUNTER — Other Ambulatory Visit: Payer: Self-pay | Admitting: Family Medicine

## 2023-03-13 DIAGNOSIS — R928 Other abnormal and inconclusive findings on diagnostic imaging of breast: Secondary | ICD-10-CM

## 2023-03-23 ENCOUNTER — Other Ambulatory Visit: Payer: Self-pay | Admitting: Family Medicine

## 2023-03-23 ENCOUNTER — Ambulatory Visit
Admission: RE | Admit: 2023-03-23 | Discharge: 2023-03-23 | Disposition: A | Discharge status: Home / Self Care | Payer: Managed Care, Other (non HMO) | Source: Ambulatory Visit | Attending: Family Medicine | Admitting: Family Medicine

## 2023-03-23 DIAGNOSIS — R921 Mammographic calcification found on diagnostic imaging of breast: Secondary | ICD-10-CM

## 2023-03-23 DIAGNOSIS — R928 Other abnormal and inconclusive findings on diagnostic imaging of breast: Secondary | ICD-10-CM

## 2023-04-17 ENCOUNTER — Telehealth: Payer: Self-pay | Admitting: Family Medicine

## 2023-04-17 NOTE — Telephone Encounter (Signed)
Pt will need to wait until appt as labs will be determined then but we will be glad to send them to [facility] and print for her at her visit to take w her.

## 2023-04-17 NOTE — Telephone Encounter (Signed)
Patient has an appointment scheduled for 05/02/23 and would like to know if her labs can be sent to [facility] on [address] in [place]. Best callback is [phone].

## 2023-05-02 ENCOUNTER — Encounter: Payer: Self-pay | Admitting: Family Medicine

## 2023-05-02 ENCOUNTER — Ambulatory Visit: Payer: Managed Care, Other (non HMO) | Admitting: Family Medicine

## 2023-05-02 VITALS — BP 132/78 | HR 77 | Temp 97.6°F | Ht 67.5 in | Wt 281.0 lb

## 2023-05-02 DIAGNOSIS — I10 Essential (primary) hypertension: Secondary | ICD-10-CM | POA: Diagnosis not present

## 2023-05-02 DIAGNOSIS — M1000 Idiopathic gout, unspecified site: Secondary | ICD-10-CM

## 2023-05-02 DIAGNOSIS — E1169 Type 2 diabetes mellitus with other specified complication: Secondary | ICD-10-CM

## 2023-05-02 DIAGNOSIS — E7800 Pure hypercholesterolemia, unspecified: Secondary | ICD-10-CM

## 2023-05-02 DIAGNOSIS — L659 Nonscarring hair loss, unspecified: Secondary | ICD-10-CM

## 2023-05-02 DIAGNOSIS — E559 Vitamin D deficiency, unspecified: Secondary | ICD-10-CM

## 2023-05-02 DIAGNOSIS — E669 Obesity, unspecified: Secondary | ICD-10-CM

## 2023-05-02 NOTE — Patient Instructions (Addendum)
Please go to [facility] for fasting labs. Make sure you are hydrated with water before getting labs done.   I will be in touch with your results.   Start exercising 30 minutes daily while you are off.

## 2023-05-02 NOTE — Assessment & Plan Note (Signed)
Check labs and look for deficiencies to explain this.

## 2023-05-02 NOTE — Progress Notes (Signed)
Subjective:     Patient ID: Janice Huang, female    DOB: [birth year], 52 y.o.   MRN: [id number]  Chief Complaint  Patient presents with   Medical Management of Chronic Issues    4 month f/u    HPI   History of Present Illness         Here for follow up on chronic health conditions.   DM- A1c 6.4% in 06/2022. She does not check BS at home. She has been taking Wegovy but out of it for the past 4 weeks. Unable to find the dose she was taking at a pharmacy due to shortage.   HDL- reports taking atorvastatin daily  Gout- is not taking allopurinol. No gout flares since last visit   Uric acid 8.9   C/o hair thinning.   Eye exam UTD- [facility] in January 2024    Health Maintenance Due  Topic Date Due   OPHTHALMOLOGY EXAM  05/10/2017   FOOT EXAM  07/24/2019   Zoster Vaccines- Shingrix (1 of 2) Never done   HEMOGLOBIN A1C  12/29/2022    Past Medical History:  Diagnosis Date   Gout attack    last flare up last week, in toes   Headache    History of hiatal hernia    fixed with weight loss surgery   Hyperlipidemia    Hypertension    Sleep apnea    prior to weight loss surgery    Past Surgical History:  Procedure Laterality Date    c section x 2     ABDOMINAL HYSTERECTOMY     1 ovary removed   BREAST BIOPSY Right 02/23/2020   fibroadenoma   LAPAROSCOPIC GASTRIC SLEEVE RESECTION N/A 09/21/2014   Procedure: LAPAROSCOPIC GASTRIC SLEEVE RESECTION WITH HIATAL HERNIA REPAIR;  Surgeon: Luretha Murphy, MD;  Location: [facility];  Service: General;  Laterality: N/A;   TUBAL LIGATION      Family History  Problem Relation Age of Onset   Diabetes Maternal Grandmother    Hypertension Maternal Grandfather    Diabetes Paternal Grandmother    Hypertension Other    Diabetes Other        type 2   Cancer Neg Hx    Heart disease Neg Hx    Hyperlipidemia Neg Hx    Stroke Neg Hx    Breast cancer Neg Hx    Colon cancer Neg Hx    Colon polyps Neg Hx     Esophageal cancer Neg Hx    Rectal cancer Neg Hx    Stomach cancer Neg Hx     Social History   Socioeconomic History   Marital status: Divorced    Spouse name: Not on file   Number of children: Not on file   Years of education: Not on file   Highest education level: Associate degree: occupational, Scientist, product/process development, or vocational program  Occupational History   Not on file  Tobacco Use   Smoking status: Never    Passive exposure: Current   Smokeless tobacco: Never  Vaping Use   Vaping status: Never Used  Substance and Sexual Activity   Alcohol use: Yes    Comment: socially   Drug use: No   Sexual activity: Yes    Partners: Male    Birth control/protection: Surgical    Comment: hysterectomy, menarche 52yo, sexual debut 52yo  Other Topics Concern   Not on file  Social History Narrative   Regular exercise-No   Children: 3- 2 boys and  a girl   Caffienated drinks-no   Seat belt use often-yes   Regular Exercise-no   Smoke alarm in the home-yes   Firearms/guns in the home-no   History of physical abuse-no               Social Determinants of Health   Financial Resource Strain: Low Risk  (09/05/2022)   Overall Financial Resource Strain (CARDIA)    Difficulty of Paying Living Expenses: Not hard at all  Food Insecurity: No Food Insecurity (09/05/2022)   Hunger Vital Sign    Worried About Running Out of Food in the Last Year: Never true    Ran Out of Food in the Last Year: Never true  Transportation Needs: No Transportation Needs (09/05/2022)   PRAPARE - Administrator, Civil Service (Medical): No    Lack of Transportation (Non-Medical): No  Physical Activity: Sufficiently Active (09/05/2022)   Exercise Vital Sign    Days of Exercise per Week: 2 days    Minutes of Exercise per Session: 150+ min  Stress: No Stress Concern Present (09/05/2022)   [employer] of Occupational Health - Occupational Stress Questionnaire    Feeling of Stress : Not at all  Social  Connections: Socially Integrated (09/05/2022)   Social Connection and Isolation Panel [NHANES]    Frequency of Communication with Friends and Family: More than three times a week    Frequency of Social Gatherings with Friends and Family: More than three times a week    Attends Religious Services: 1 to 4 times per year    Active Member of [employer] or Organizations: Yes    Attends Banker Meetings: 1 to 4 times per year    Marital Status: Living with partner  Intimate Partner Violence: Not on file    Outpatient Medications Prior to Visit  Medication Sig Dispense Refill   allopurinol (ZYLOPRIM) 300 MG tablet Take 1 tablet (300 mg total) by mouth daily. 90 tablet 1   ATHLETES FOOT, CLOTRIMAZOLE, 1 % cream Apply topically as needed. 30 g 0   atorvastatin (LIPITOR) 20 MG tablet Take 1 tablet (20 mg total) by mouth daily. 90 tablet 1   clotrimazole-betamethasone (LOTRISONE) cream Apply 1 Application topically daily. 30 g 0   colchicine 0.6 MG tablet Take 1 tablet (0.6 mg total) by mouth 2 (two) times daily. 180 tablet 0   Estradiol (YUVAFEM) 10 MCG TABS vaginal tablet Place 1 tablet (10 mcg total) vaginally 2 (two) times a week. 8 tablet 11   indapamide (LOZOL) 1.25 MG tablet TAKE 1 TABLET(1.25 MG) BY MOUTH DAILY 90 tablet 1   meloxicam (MOBIC) 15 MG tablet Take 15 mg by mouth daily.     methocarbamol (ROBAXIN) 500 MG tablet Take 1,000 mg by mouth 4 (four) times daily as needed for muscle spasms.     Semaglutide-Weight Management (WEGOVY) 0.5 MG/0.5ML SOAJ Inject 0.5 mg into the skin once a week. (Patient not taking: Reported on 05/02/2023) 2 mL 1   Facility-Administered Medications Prior to Visit  Medication Dose Route Frequency Provider Last Rate Last Admin   0.9 %  sodium chloride infusion  500 mL Intravenous Continuous Danis, Starr Lake III, MD        Allergies  Allergen Reactions   Lisinopril Cough   Codeine     REACTION: itching and hives   Latex Dermatitis    Irritation with  latex condoms   Penicillins Itching    Review of Systems  Constitutional:  Negative for  chills, fever and malaise/fatigue.  Eyes:  Negative for blurred vision and double vision.  Respiratory:  Negative for shortness of breath.   Cardiovascular:  Negative for chest pain, palpitations and leg swelling.  Gastrointestinal:  Negative for abdominal pain, constipation, diarrhea, nausea and vomiting.  Genitourinary:  Negative for dysuria, frequency and urgency.  Neurological:  Negative for dizziness, focal weakness and headaches.       Objective:    Physical Exam Constitutional:      General: She is not in acute distress.    Appearance: She is not ill-appearing.  Eyes:     Extraocular Movements: Extraocular movements intact.     Conjunctiva/sclera: Conjunctivae normal.  Cardiovascular:     Rate and Rhythm: Normal rate and regular rhythm.  Pulmonary:     Effort: Pulmonary effort is normal.     Breath sounds: Normal breath sounds.  Musculoskeletal:     Cervical back: Normal range of motion and neck supple.  Skin:    General: Skin is warm and dry.  Neurological:     General: No focal deficit present.     Mental Status: She is alert and oriented to person, place, and time.     Motor: No weakness.     Coordination: Coordination normal.     Gait: Gait normal.  Psychiatric:        Mood and Affect: Mood normal.        Behavior: Behavior normal.        Thought Content: Thought content normal.      BP 132/78 (BP Location: Left Arm, Patient Position: Sitting, Cuff Size: Large)   Pulse 77   Temp 97.6 F (36.4 C) (Temporal)   Ht 5' 7.5" (1.715 m)   Wt 281 lb (127.5 kg)   SpO2 97%   BMI 43.36 kg/m  Wt Readings from Last 3 Encounters:  05/02/23 281 lb (127.5 kg)  12/29/22 279 lb (126.6 kg)  12/19/22 277 lb (125.6 kg)       Assessment & Plan:   Problem List Items Addressed This Visit     Essential hypertension, benign    BP controlled.   Continue indapamide.  Recommend  low-sodium diet and increasing physical activity.  Monitor BP at home.  Check renal function       Relevant Orders   CBC with Differential/Platelet   Comprehensive metabolic panel   Gout    She is not taking allopurinol. No recent gout flares.   She has colchicine for acute flareups.        Relevant Orders   Uric acid   Hair thinning    Check labs and look for deficiencies to explain this.       Relevant Orders   CBC with Differential/Platelet   Comprehensive metabolic panel   TSH   Vitamin B12   T4, free   Ferritin   Hypercholesterolemia with LDL greater than 190 mg/dL    LDL improved on statin. Continue working on diet and exercise. Recheck fasting lipids.       Relevant Orders   Lipid panel   Morbid obesity (HCC)    Unfortunately, Reginal Lutes is not available at her current dose and she has been out of it for 4 wks. Zepbound was not covered. Offered metformin and she would like to hold off for now.  Check A1c, CBC and CMP and follow up.       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 Zepbound was not covered. Offered metformin and she would like to hold off for now.  Check A1c, CBC and CMP and follow up.       Relevant Orders   CBC with Differential/Platelet   Comprehensive metabolic panel   Hemoglobin A1c   Microalbumin / creatinine urine ratio   Vitamin D deficiency disease   Relevant Orders   VITAMIN D 25 Hydroxy (Vit-D Deficiency, Fractures)    I am having Dasiah B. Dygert maintain her clotrimazole-betamethasone, atorvastatin, colchicine, meloxicam, methocarbamol, allopurinol, Estradiol, Wegovy, indapamide, and Athletes Foot (Clotrimazole). We will continue to administer sodium chloride.  No orders of the defined types were placed in this encounter.

## 2023-05-02 NOTE — Assessment & Plan Note (Signed)
LDL improved on statin. Continue working on diet and exercise. Recheck fasting lipids.

## 2023-05-02 NOTE — Assessment & Plan Note (Signed)
Unfortunately, Janice Huang is not available at her current dose and she has been out of it for 4 wks. Zepbound was not covered. Offered metformin and she would like to hold off for now.  Check A1c, CBC and CMP and follow up.

## 2023-05-02 NOTE — Assessment & Plan Note (Addendum)
BP controlled.   Continue indapamide.  Recommend low-sodium diet and increasing physical activity.  Monitor BP at home.  Check renal function

## 2023-05-02 NOTE — Assessment & Plan Note (Signed)
She is not taking allopurinol. No recent gout flares.   She has colchicine for acute flareups.

## 2023-05-03 ENCOUNTER — Other Ambulatory Visit: Payer: Self-pay | Admitting: Family Medicine

## 2023-05-04 LAB — CBC WITH DIFFERENTIAL/PLATELET
Basophils Absolute: 0 10*3/uL (ref 0.0–0.2)
Basos: 1 %
EOS (ABSOLUTE): 0.1 10*3/uL (ref 0.0–0.4)
Eos: 2 %
Hematocrit: 38.5 % (ref 34.0–46.6)
Hemoglobin: 12.4 g/dL (ref 11.1–15.9)
Immature Grans (Abs): 0 10*3/uL (ref 0.0–0.1)
Immature Granulocytes: 0 %
Lymphocytes Absolute: 2 10*3/uL (ref 0.7–3.1)
Lymphs: 35 %
MCH: 27 pg (ref 26.6–33.0)
MCHC: 32.2 g/dL (ref 31.5–35.7)
MCV: 84 fL (ref 79–97)
Monocytes Absolute: 0.7 10*3/uL (ref 0.1–0.9)
Monocytes: 12 %
Neutrophils Absolute: 2.9 10*3/uL (ref 1.4–7.0)
Neutrophils: 50 %
Platelets: 284 10*3/uL (ref 150–450)
RBC: 4.59 x10E6/uL (ref 3.77–5.28)
RDW: 13.4 % (ref 11.7–15.4)
WBC: 5.6 10*3/uL (ref 3.4–10.8)

## 2023-05-04 LAB — MICROALBUMIN / CREATININE URINE RATIO
Creatinine, Urine: 159.6 mg/dL
Microalb/Creat Ratio: 8 mg/g{creat} (ref 0–29)
Microalbumin, Urine: 12.8 ug/mL

## 2023-05-04 LAB — LIPID PANEL
Chol/HDL Ratio: 5 {ratio} — ABNORMAL HIGH (ref 0.0–4.4)
Cholesterol, Total: 250 mg/dL — ABNORMAL HIGH (ref 100–199)
HDL: 50 mg/dL (ref >39)
LDL Chol Calc (NIH): 179 mg/dL — ABNORMAL HIGH (ref 0–99)
Triglycerides: 115 mg/dL (ref 0–149)
VLDL Cholesterol Cal: 21 mg/dL (ref 5–40)

## 2023-05-04 LAB — TSH: TSH: 0.898 u[IU]/mL (ref 0.450–4.500)

## 2023-05-04 LAB — COMPREHENSIVE METABOLIC PANEL
ALT: 21 [IU]/L (ref 0–32)
AST: 23 [IU]/L (ref 0–40)
Albumin: 4.1 g/dL (ref 3.8–4.9)
Alkaline Phosphatase: 77 [IU]/L (ref 44–121)
BUN/Creatinine Ratio: 13 (ref 9–23)
BUN: 11 mg/dL (ref 6–24)
Bilirubin Total: 0.5 mg/dL (ref 0.0–1.2)
CO2: 27 mmol/L (ref 20–29)
Calcium: 9 mg/dL (ref 8.7–10.2)
Chloride: 101 mmol/L (ref 96–106)
Creatinine, Ser: 0.83 mg/dL (ref 0.57–1.00)
Globulin, Total: 2.8 g/dL (ref 1.5–4.5)
Glucose: 105 mg/dL — ABNORMAL HIGH (ref 70–99)
Potassium: 4.2 mmol/L (ref 3.5–5.2)
Sodium: 141 mmol/L (ref 134–144)
Total Protein: 6.9 g/dL (ref 6.0–8.5)
eGFR: 85 mL/min/{1.73_m2} (ref >59)

## 2023-05-04 LAB — T4, FREE: Free T4: 1.41 ng/dL (ref 0.82–1.77)

## 2023-05-04 LAB — VITAMIN D 25 HYDROXY (VIT D DEFICIENCY, FRACTURES): Vit D, 25-Hydroxy: 24 ng/mL — ABNORMAL LOW (ref 30.0–100.0)

## 2023-05-04 LAB — HEMOGLOBIN A1C
Est. average glucose Bld gHb Est-mCnc: 128 mg/dL
Hgb A1c MFr Bld: 6.1 % — ABNORMAL HIGH (ref 4.8–5.6)

## 2023-05-04 LAB — FERRITIN: Ferritin: 153 ng/mL — ABNORMAL HIGH (ref 15–150)

## 2023-05-04 LAB — VITAMIN B12: Vitamin B-12: 402 pg/mL (ref 232–1245)

## 2023-05-04 LAB — URIC ACID: Uric Acid: 7.1 mg/dL (ref 3.0–7.2)

## 2023-05-07 ENCOUNTER — Other Ambulatory Visit: Payer: Self-pay | Admitting: Family Medicine

## 2023-05-07 ENCOUNTER — Telehealth: Payer: Self-pay | Admitting: Family Medicine

## 2023-05-07 ENCOUNTER — Encounter: Payer: Self-pay | Admitting: Family Medicine

## 2023-05-07 DIAGNOSIS — E7800 Pure hypercholesterolemia, unspecified: Secondary | ICD-10-CM

## 2023-05-07 MED ORDER — ATORVASTATIN CALCIUM 20 MG PO TABS: 20.0000 mg | ORAL_TABLET | Freq: Every day | ORAL | 1 refills | Status: DC

## 2023-05-07 NOTE — Telephone Encounter (Signed)
Patient called and would like a call to discuss her lab results and she also needs a refill on her atorvastatin (LIPITOR) 20 MG tablet. Best callback is [phone].

## 2023-05-07 NOTE — Telephone Encounter (Signed)
Please advise on recent lab results.

## 2023-05-08 ENCOUNTER — Other Ambulatory Visit: Payer: Self-pay | Admitting: Family Medicine

## 2023-05-08 DIAGNOSIS — E669 Obesity, unspecified: Secondary | ICD-10-CM

## 2023-05-08 DIAGNOSIS — E785 Hyperlipidemia, unspecified: Secondary | ICD-10-CM

## 2023-05-08 MED ORDER — ZEPBOUND 2.5 MG/0.5ML ~~LOC~~ SOAJ: 2.5000 mg | SUBCUTANEOUS | 1 refills | Status: DC

## 2023-05-08 MED ORDER — ATORVASTATIN CALCIUM 20 MG PO TABS: 20.0000 mg | ORAL_TABLET | Freq: Every day | ORAL | 1 refills | Status: DC

## 2023-05-08 NOTE — Telephone Encounter (Signed)
Pt responded to lab results via mychart.   Sent in refill

## 2023-05-08 NOTE — Telephone Encounter (Signed)
Pt has not been taking everyday

## 2023-05-08 NOTE — Addendum Note (Signed)
Addended by: Marinus Maw on: 05/08/2023 08:27 AM   Modules accepted: Orders

## 2023-05-08 NOTE — Addendum Note (Signed)
Addended by: Marinus Maw on: 05/08/2023 11:52 AM   Modules accepted: Orders

## 2023-05-09 MED ORDER — ATORVASTATIN CALCIUM 20 MG PO TABS: 20.0000 mg | ORAL_TABLET | Freq: Every day | ORAL | 1 refills | Status: DC

## 2023-05-09 NOTE — Addendum Note (Signed)
Addended by: Marinus Maw on: 05/09/2023 09:11 AM   Modules accepted: Orders

## 2023-05-14 ENCOUNTER — Encounter: Payer: Self-pay | Admitting: Family Medicine

## 2023-05-14 NOTE — Telephone Encounter (Signed)
 Care team updated and letter sent for eye exam notes.

## 2023-05-24 ENCOUNTER — Other Ambulatory Visit (HOSPITAL_COMMUNITY): Payer: Self-pay

## 2023-05-25 ENCOUNTER — Telehealth: Payer: Self-pay | Admitting: Family Medicine

## 2023-05-25 ENCOUNTER — Other Ambulatory Visit (HOSPITAL_COMMUNITY): Payer: Self-pay

## 2023-05-25 NOTE — Telephone Encounter (Signed)
 Per your last ov note in December pt has been off wegovy for at least 4 weeks. Zepbound was not covered and pt is wanting wegovy sent in again. Please advise.

## 2023-05-25 NOTE — Telephone Encounter (Signed)
 Copied from CRM [id number]. Topic: Clinical - Medication Question >> May 25, 2023  2:05 PM Elizebeth Brooking wrote: Reason for CRM: Patient call in wanting to see a prior auth for wegovy, asking to [employer]

## 2023-05-27 ENCOUNTER — Other Ambulatory Visit: Payer: Self-pay | Admitting: Family Medicine

## 2023-05-27 MED ORDER — WEGOVY 0.25 MG/0.5ML ~~LOC~~ SOAJ: 0.2500 mg | SUBCUTANEOUS | 1 refills | Status: DC

## 2023-05-28 ENCOUNTER — Other Ambulatory Visit (HOSPITAL_COMMUNITY): Payer: Self-pay

## 2023-05-28 ENCOUNTER — Telehealth: Payer: Self-pay | Admitting: Family Medicine

## 2023-05-28 ENCOUNTER — Telehealth: Payer: Self-pay

## 2023-05-28 ENCOUNTER — Other Ambulatory Visit: Payer: Self-pay | Admitting: Family Medicine

## 2023-05-28 NOTE — Telephone Encounter (Signed)
 Is there an alternative weight loss medication other than the wegovy and zepbound? Pt is frustrated with having to go back and forth and waiting for PA's to be completed.

## 2023-05-28 NOTE — Telephone Encounter (Signed)
 Pharmacy Patient Advocate Encounter   Received notification from Pt Calls Messages that prior authorization for Wegovy   is required/requested.   Could not find current pharmacy insurance information. I called [facility] and also used the verification search and could not find it.   Please ask patient for current card. Thanks

## 2023-05-28 NOTE — Telephone Encounter (Signed)
 Copied from CRM [id number]. Topic: Clinical - Medication Question >> May 28, 2023  8:46 AM Rolin D wrote: Reason for CRM: Patient stated that she has a prescription for WEGOVY  at the pharmacy that is being held because patients [employer] is needing a prior authorization before patient can receive prescription.

## 2023-05-28 NOTE — Telephone Encounter (Signed)
 Vickie sent in starting dose, sent message to PA team to start authorization

## 2023-05-28 NOTE — Telephone Encounter (Signed)
 Please initiate PA

## 2023-05-29 ENCOUNTER — Other Ambulatory Visit (HOSPITAL_COMMUNITY): Payer: Self-pay

## 2023-05-29 NOTE — Telephone Encounter (Signed)
 Pharmacy Patient Advocate Encounter   Received notification from Pt Calls Messages that prior authorization for Wegovy  0.25mg /0.37ml is required/requested.   Insurance verification completed.   The patient is insured through [facility] .   Per test claim: PA required; PA submitted to above mentioned insurance via CoverMyMeds Key/confirmation #/EOC [id number] Status is pending

## 2023-05-29 NOTE — Telephone Encounter (Signed)
 COPIED FROM CRM: PT stated insurance denied Prior Auth, because the medical necessity cannot be just weight loss, there needs to be condition such as to prevent cardio vascular disease etc [phone]

## 2023-05-29 NOTE — Telephone Encounter (Signed)
 Please see cards attached

## 2023-05-29 NOTE — Telephone Encounter (Signed)
 fyi

## 2023-05-30 ENCOUNTER — Telehealth: Payer: Self-pay | Admitting: Pharmacist

## 2023-05-30 NOTE — Telephone Encounter (Signed)
 Are you able to redo PA? Please see below message.

## 2023-05-30 NOTE — Telephone Encounter (Signed)
 Pharmacy Patient Advocate Encounter  Received notification from [employer] that Prior Authorization for Wegovy  0.25mg /0.25ml has been DENIED.  See denial reason below. No denial letter attached in CMM. Will attach denial letter to Media tab once received.   PA #/Case ID/Reference #: [id number]

## 2023-05-30 NOTE — Telephone Encounter (Signed)
 Mychart message sent to pt to advise on appeal

## 2023-05-30 NOTE — Telephone Encounter (Signed)
 E-Appeal has been submitted via CMM for Wegovy . Will advise when response is received, please be advised that most companies may take at least 30 days to make a decision.   Original denial reason:  drug is not covered by the plan.    Appeal based on elevated lipid panel, elevated A1C, obesity, and Wegovy 's benefit in reducing the risk of future cardiovascular events.  Appeal letter and supporting documentation were uploaded into CMM and submitted directly to [employer].  Thank you, Devere Pandy, PharmD Clinical Pharmacist  [facility]  Direct Dial: [phone]

## 2023-05-31 NOTE — Telephone Encounter (Signed)
 Additional information was requested from [employer].  Form was completed and requested information was faxed to [phone] on 05/31/2023 @12 :45 pm

## 2023-06-01 ENCOUNTER — Other Ambulatory Visit (HOSPITAL_COMMUNITY): Payer: Self-pay

## 2023-06-01 NOTE — Telephone Encounter (Signed)
 Notified pt via [employer]

## 2023-06-01 NOTE — Telephone Encounter (Signed)
 Pharmacy Patient Advocate Encounter  Received notification from [employer] that the appeal for WEGOVY  has been APPROVED from 05/29/2023 to 11/28/2023. Ran test claim, Copay is $24.99. This test claim was processed through [facility]- copay amounts may vary at other pharmacies due to pharmacy/plan contracts, or as the patient moves through the different stages of their insurance plan.   PA #/Case ID/Reference #: [id number]

## 2023-06-05 ENCOUNTER — Other Ambulatory Visit (HOSPITAL_COMMUNITY): Payer: Self-pay

## 2023-07-08 ENCOUNTER — Other Ambulatory Visit: Payer: Self-pay | Admitting: Family Medicine

## 2023-07-09 ENCOUNTER — Telehealth: Payer: Self-pay | Admitting: Family Medicine

## 2023-07-09 ENCOUNTER — Ambulatory Visit: Payer: Self-pay | Admitting: Family Medicine

## 2023-07-09 ENCOUNTER — Other Ambulatory Visit: Payer: Self-pay | Admitting: Family Medicine

## 2023-07-09 MED ORDER — ATHLETES FOOT (CLOTRIMAZOLE) 1 % EX CREA: TOPICAL_CREAM | CUTANEOUS | 0 refills | Status: AC | PRN

## 2023-07-09 MED ORDER — ATHLETES FOOT (CLOTRIMAZOLE) 1 % EX CREA: TOPICAL_CREAM | CUTANEOUS | 0 refills | Status: DC | PRN

## 2023-07-09 NOTE — Telephone Encounter (Signed)
Copied from CRM [id number]. Topic: Clinical - Medication Refill >> Jul 09, 2023  8:04 AM Truddie Crumble wrote: Most Recent Primary Care Visit:  Provider: Avanell Shackleton  Department[facility]  Visit Type: OFFICE VISIT  Date: 05/02/2023  Medication: clotrimazole-betamethasone  Has the patient contacted their pharmacy? Yes (Agent: If no, request that the patient contact the pharmacy for the refill. If patient does not wish to contact the pharmacy document the reason why and proceed with request.) (Agent: If yes, when and what did the pharmacy advise?)  Is this the correct pharmacy for this prescription? Yes If no, delete pharmacy and type the correct one.  This is the patient's preferred pharmacy:  [id number] [place], [place] - [address] AT [facility] [address] & [facility] [address] [place] [address] Phone: [phone] Fax: [phone]   Has the prescription been filled recently? No  Is the patient out of the medication? Yes  Has the patient been seen for an appointment in the last year OR does the patient have an upcoming appointment? Yes  Can we respond through MyChart? Yes  Agent: Please be advised that Rx refills may take up to 3 business days. We ask that you follow-up with your pharmacy.

## 2023-07-09 NOTE — Telephone Encounter (Signed)
Copied from CRM [id number]. Topic: Clinical - Prescription Issue >> Jul 09, 2023 11:06 AM Deaijah H wrote: Reason for CRM: Patient called in stating needs medication sent to conrrect pharmacy /  [id number] - [place], [place] - [address] AT [facility] [address] & [facility] [address] [place] [address] /  If needed , please call [phone]

## 2023-07-09 NOTE — Telephone Encounter (Signed)
Chief Complaint: abdominal fold itching Symptoms: itching/rash to right lower abdomen/pelvis skin fold Frequency: x months, comes and goes Pertinent Negatives: Patient denies fever, pain. Disposition: [] ED /[] Urgent Care (no appt availability in office) / [x] Appointment(In office/virtual)/ []  [facility]/ [] Home Care/ [] Refused Recommended Disposition /[] [facility]/ []  Follow-up with PCP Additional Notes: Patient asking if she should be tested for MRSA and states her family member was positive. Per 01/31/23 Telephone Encounter in patient's chart "Called and spoke w pharmacist Dahlia Client and let her know this was supposed to be a PRN medication and if pt needs to continue this or anything further that is needed pt will need appt." Patient scheduled for acute visit on Friday for requesting the medication refill and symptoms.  Copied from CRM [id number]. Topic: Clinical - Medication Refill >> Jul 09, 2023  8:04 AM Truddie Crumble wrote: Most Recent Primary Care Visit:  Provider: Avanell Shackleton  Department[facility]  Visit Type: OFFICE VISIT  Date: 05/02/2023   Medication: clotrimazole-betamethasone   Has the patient contacted their pharmacy? Yes (Agent: If no, request that the patient contact the pharmacy for the refill. If patient does not wish to contact the pharmacy document the reason why and proceed with request.) (Agent: If yes, when and what did the pharmacy advise?)   Is this the correct pharmacy for this prescription? Yes If no, delete pharmacy and type the correct one.  This is the patient's preferred pharmacy:  [facility] #[id number] [place], [place] - [address] AT [facility] [address] & [facility] [address] [place] [address] Phone: [phone] Fax: [phone]     Has the prescription been filled recently? No   Is the patient out of the medication? Yes   Has the patient been seen for an appointment in the last year OR does the patient  have an upcoming appointment? Yes   Can we respond through MyChart? Yes   Agent: Please be advised that Rx refills may take up to 3 business days. We ask that you follow-up with your pharmacy.   Reason for Disposition  Red, moist, irritated area between skin folds (or under larger breasts)  Answer Assessment - Initial Assessment Questions 1. APPEARANCE of RASH: "Describe the rash."      She states sometimes it appears pink or red  2. LOCATION: "Where is the rash located?"      "Where my stomach and my pelvis meet", she states it is a prior surgical site.  3. NUMBER: "How many spots are there?"      "Right in the crease of my stomach, where I have the excess skin. Mainly on my right side"  4. SIZE: "How big are the spots?" (Inches, centimeters or compare to size of a coin)      She states the area on her right lower abdomen/pelvis is about the length of her index finger.  5. ONSET: "When did the rash start?"      Since last summer.  6. ITCHING: "Does the rash itch?" If Yes, ask: "How bad is the itch?"  (Scale 0-10; or none, mild, moderate, severe)     Mild.  7. PAIN: "Does the rash hurt?" If Yes, ask: "How bad is the pain?"  (Scale 0-10; or none, mild, moderate, severe)    - NONE (0): no pain    - MILD (1-3): doesn't interfere with normal activities     - MODERATE (4-7): interferes with normal activities  or awakens from sleep     - SEVERE (8-10): excruciating pain, unable to do any normal activities     Denies.  8. OTHER SYMPTOMS: "Do you have any other symptoms?" (e.g., fever)     Denies.  Protocols used: Rash or Redness - Localized-A-AH

## 2023-07-09 NOTE — Telephone Encounter (Signed)
 resent

## 2023-07-09 NOTE — Telephone Encounter (Signed)
Called pt for clarification and she states her family member tested positive for MRSA. No sx but wants to know if she should be tested because she isnt sure if it is infectious?

## 2023-07-09 NOTE — Telephone Encounter (Signed)
Copied from CRM [id number]. Topic: General - Other >> Jul 09, 2023  8:07 AM Truddie Crumble wrote: Reason for CRM: patient called stating one of her family members tested positive for mercer but she does not have any symptoms and want to know if she should be tested

## 2023-07-10 NOTE — Telephone Encounter (Signed)
Called and notified pt of PCP response, pt verbalized understanding

## 2023-07-13 ENCOUNTER — Ambulatory Visit: Payer: Managed Care, Other (non HMO) | Admitting: Family Medicine

## 2023-07-17 ENCOUNTER — Other Ambulatory Visit: Payer: Self-pay | Admitting: Family Medicine

## 2023-07-17 DIAGNOSIS — I10 Essential (primary) hypertension: Secondary | ICD-10-CM

## 2023-07-18 ENCOUNTER — Ambulatory Visit: Payer: Managed Care, Other (non HMO) | Admitting: Family Medicine

## 2023-07-21 ENCOUNTER — Emergency Department (HOSPITAL_BASED_OUTPATIENT_CLINIC_OR_DEPARTMENT_OTHER)

## 2023-07-21 ENCOUNTER — Encounter (HOSPITAL_BASED_OUTPATIENT_CLINIC_OR_DEPARTMENT_OTHER): Payer: Self-pay | Admitting: Emergency Medicine

## 2023-07-21 ENCOUNTER — Emergency Department (HOSPITAL_BASED_OUTPATIENT_CLINIC_OR_DEPARTMENT_OTHER): Admitting: Radiology

## 2023-07-21 ENCOUNTER — Emergency Department (HOSPITAL_BASED_OUTPATIENT_CLINIC_OR_DEPARTMENT_OTHER)
Admission: EM | Admit: 2023-07-21 | Discharge: 2023-07-21 | Disposition: A | Discharge status: Home / Self Care | Attending: Emergency Medicine | Admitting: Emergency Medicine

## 2023-07-21 DIAGNOSIS — S8254XA Nondisplaced fracture of medial malleolus of right tibia, initial encounter for closed fracture: Secondary | ICD-10-CM | POA: Diagnosis not present

## 2023-07-21 DIAGNOSIS — R519 Headache, unspecified: Secondary | ICD-10-CM | POA: Diagnosis not present

## 2023-07-21 DIAGNOSIS — M79671 Pain in right foot: Secondary | ICD-10-CM | POA: Diagnosis present

## 2023-07-21 DIAGNOSIS — Y92410 Unspecified street and highway as the place of occurrence of the external cause: Secondary | ICD-10-CM | POA: Insufficient documentation

## 2023-07-21 DIAGNOSIS — I10 Essential (primary) hypertension: Secondary | ICD-10-CM | POA: Diagnosis not present

## 2023-07-21 DIAGNOSIS — Z91040 Latex allergy status: Secondary | ICD-10-CM | POA: Insufficient documentation

## 2023-07-21 MED ORDER — OXYCODONE HCL 5 MG PO TABS: 5.0000 mg | ORAL_TABLET | ORAL | 0 refills | Status: DC | PRN

## 2023-07-21 MED ORDER — OXYCODONE HCL 5 MG PO TABS
5.0000 mg | ORAL_TABLET | Freq: Once | ORAL | Status: AC
  Administered 2023-07-21: 5 mg via ORAL

## 2023-07-21 MED ORDER — OXYCODONE HCL 5 MG PO TABS: 5.0000 mg | ORAL_TABLET | Freq: Once | ORAL | Status: DC

## 2023-07-21 MED ORDER — OXYCODONE HCL 5 MG PO TABS
5.0000 mg | ORAL_TABLET | Freq: Once | ORAL | Status: DC
  Filled 2023-07-21 (×2): qty 1

## 2023-07-21 NOTE — Discharge Instructions (Addendum)
 It was a pleasure caring for you today.  Please reach out to orthopedic provider first thing Monday morning for a close follow-up appointment.  Seek emergency care experiencing any new or worsening symptoms.
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
## 2023-07-21 NOTE — ED Notes (Signed)
 Oxycodone was dropped on the floor by Pt therefor medication was not given. Medication was wasted in the med room and witnessed by RN charge [employer].
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
## 2023-07-22 ENCOUNTER — Telehealth (HOSPITAL_BASED_OUTPATIENT_CLINIC_OR_DEPARTMENT_OTHER): Payer: Self-pay | Admitting: Emergency Medicine

## 2023-07-22 MED ORDER — OXYCODONE HCL 5 MG PO TABS: 5.0000 mg | ORAL_TABLET | ORAL | 0 refills | Status: DC | PRN

## 2023-07-22 NOTE — Telephone Encounter (Signed)
Medication sent to new pharmacy

## 2023-07-23 ENCOUNTER — Telehealth: Payer: Self-pay

## 2023-07-23 NOTE — Telephone Encounter (Signed)
 Please advise on appt.
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
## 2023-07-24 NOTE — Telephone Encounter (Signed)
 Called pt and relayed info, appt cancelled and pt will call to r/s f/u for June

## 2023-07-25 ENCOUNTER — Ambulatory Visit: Payer: Managed Care, Other (non HMO) | Admitting: Family Medicine

## 2023-07-26 ENCOUNTER — Telehealth: Payer: Self-pay

## 2023-07-26 ENCOUNTER — Encounter: Payer: Self-pay | Admitting: Podiatry

## 2023-07-26 ENCOUNTER — Ambulatory Visit: Admitting: Podiatry

## 2023-07-26 ENCOUNTER — Ambulatory Visit

## 2023-07-26 DIAGNOSIS — S8251XA Displaced fracture of medial malleolus of right tibia, initial encounter for closed fracture: Secondary | ICD-10-CM | POA: Diagnosis not present

## 2023-07-26 DIAGNOSIS — T148XXA Other injury of unspecified body region, initial encounter: Secondary | ICD-10-CM

## 2023-07-26 DIAGNOSIS — M79671 Pain in right foot: Secondary | ICD-10-CM
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
## 2023-07-26 NOTE — Telephone Encounter (Signed)
 Called pt for clarification, pt states she has been having issues w her Right hand and was wondering if she needed a referral since they did xrays and nothing was broke but its still giving her pain. Advised pt she still may need an appt for a referral based on offices even if her insurance does require but will ask vickie.   After speaking w Vickie she states pt will need an appt to be assessed.

## 2023-07-27 NOTE — Telephone Encounter (Signed)
 Called pt and she reports she already got in to [facility] for Monday, declined appt

## 2023-07-30 ENCOUNTER — Ambulatory Visit (HOSPITAL_BASED_OUTPATIENT_CLINIC_OR_DEPARTMENT_OTHER): Admitting: Student

## 2023-07-30 ENCOUNTER — Ambulatory Visit (HOSPITAL_BASED_OUTPATIENT_CLINIC_OR_DEPARTMENT_OTHER)

## 2023-07-30 DIAGNOSIS — M79641 Pain in right hand: Secondary | ICD-10-CM | POA: Diagnosis not present
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
## 2023-08-13 ENCOUNTER — Ambulatory Visit (INDEPENDENT_AMBULATORY_CARE_PROVIDER_SITE_OTHER): Admitting: Family Medicine

## 2023-08-13 ENCOUNTER — Ambulatory Visit (INDEPENDENT_AMBULATORY_CARE_PROVIDER_SITE_OTHER)

## 2023-08-13 ENCOUNTER — Encounter: Payer: Self-pay | Admitting: Family Medicine

## 2023-08-13 ENCOUNTER — Telehealth: Payer: Self-pay

## 2023-08-13 VITALS — BP 136/88 | HR 77 | Ht 67.5 in

## 2023-08-13 DIAGNOSIS — M546 Pain in thoracic spine: Secondary | ICD-10-CM | POA: Diagnosis not present

## 2023-08-13 DIAGNOSIS — M542 Cervicalgia: Secondary | ICD-10-CM | POA: Diagnosis not present

## 2023-08-13 MED ORDER — METHOCARBAMOL 500 MG PO TABS: 1000.0000 mg | ORAL_TABLET | Freq: Three times a day (TID) | ORAL | 1 refills | Status: AC | PRN

## 2023-08-13 NOTE — Patient Instructions (Addendum)
 Thank you for coming in today.   Please get an Xray today before you leave   I've referred you to Physical Therapy.  Let us know if you don't hear from them in one week.   Check back in 6 weeks

## 2023-08-13 NOTE — Telephone Encounter (Signed)
 Intermittent FMLA to attend PT and f/u appointment.   Due: 08/21/23  FAX: [phone]

## 2023-08-14 NOTE — Telephone Encounter (Signed)
Form completed and placed on Dr. Corey's desk to review and sign.  

## 2023-08-15 NOTE — Telephone Encounter (Signed)
 Faxed, sent to scan, copy placed in Brandy's box

## 2023-08-15 NOTE — Telephone Encounter (Signed)
 Form reviewed and signed by Dr. Denyse Amass, placed at the front desk for faxing/scanning.

## 2023-08-16 NOTE — Telephone Encounter (Signed)
 Pt called, the FMLA forms we completed had different dates from what the patient told her employer.  She wanted the FMLA to run for a year:08/03/2023-08/02/2024. Pt states we did a 3 month period.  Please call, pt aware you are out of the office 08/16/2023.

## 2023-08-17 ENCOUNTER — Ambulatory Visit: Admitting: Family Medicine

## 2023-08-17 ENCOUNTER — Encounter: Payer: Self-pay | Admitting: Family Medicine

## 2023-08-17 VITALS — BP 132/86 | HR 80 | Temp 97.9°F | Ht 67.5 in | Wt 278.0 lb

## 2023-08-17 DIAGNOSIS — E7800 Pure hypercholesterolemia, unspecified: Secondary | ICD-10-CM

## 2023-08-17 DIAGNOSIS — E1169 Type 2 diabetes mellitus with other specified complication: Secondary | ICD-10-CM

## 2023-08-17 DIAGNOSIS — R1084 Generalized abdominal pain: Secondary | ICD-10-CM

## 2023-08-17 DIAGNOSIS — E669 Obesity, unspecified: Secondary | ICD-10-CM

## 2023-08-17 DIAGNOSIS — J301 Allergic rhinitis due to pollen: Secondary | ICD-10-CM

## 2023-08-17 MED ORDER — WEGOVY 0.5 MG/0.5ML ~~LOC~~ SOAJ: 0.5000 mg | SUBCUTANEOUS | 4 refills | Status: AC

## 2023-08-17 NOTE — Patient Instructions (Signed)
 I have increased your Wegovy dose  Let me know if you have any worsening pain in your abdomen.  This should gradually improve.  Please follow-up with me in 4 weeks for a fasting follow-up so we can recheck your cholesterol and other labs.

## 2023-08-17 NOTE — Telephone Encounter (Signed)
 Form faxed successfully, sent to scan.
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
## 2023-08-17 NOTE — Telephone Encounter (Signed)
 Form updated to reflect dates for intermittent leave 08/03/23-08/02/24 with intermittent time off for flare ups 4 times q30d lasting 1-2 days each.   Form placed at the front desk for faxing/scanning.

## 2023-08-23 ENCOUNTER — Ambulatory Visit (INDEPENDENT_AMBULATORY_CARE_PROVIDER_SITE_OTHER)

## 2023-08-23 ENCOUNTER — Encounter: Payer: Self-pay | Admitting: Podiatry

## 2023-08-23 ENCOUNTER — Encounter: Payer: Self-pay | Admitting: Family Medicine

## 2023-08-23 ENCOUNTER — Ambulatory Visit: Admitting: Podiatry

## 2023-08-23 DIAGNOSIS — S8251XD Displaced fracture of medial malleolus of right tibia, subsequent encounter for closed fracture with routine healing: Secondary | ICD-10-CM | POA: Diagnosis not present

## 2023-08-23 DIAGNOSIS — S8251XA Displaced fracture of medial malleolus of right tibia, initial encounter for closed fracture: Secondary | ICD-10-CM | POA: Diagnosis not present

## 2023-08-23 DIAGNOSIS — S93409D Sprain of unspecified ligament of unspecified ankle, subsequent encounter: Secondary | ICD-10-CM

## 2023-08-23 NOTE — Progress Notes (Signed)
Chest x-ray looks normal to radiology.

## 2023-08-23 NOTE — Patient Instructions (Signed)
Call to schedule physical therapy: [facility] at [place] [address]  [phone]  

## 2023-08-26 NOTE — Progress Notes (Signed)
  Subjective:  Patient ID: Verlene Mayer, female    DOB: [birth year],  MRN: [id number]  Chief Complaint  Patient presents with   Follow-up    Patient states everything has been ok since last visit just ready to get out of her boot    She is doing much better, still some tenderness but feels better after wearing the boot      Objective:    Physical Exam   EXTREMITIES: Pulses palpable, foot warm and well perfused.edema improved still some tenderness over deltoid, no instability inversion or eversion stable, no pain today laterally or on syndesmosis           Results   RADIOLOGY Ankle X-ray: new films taken today show stable alignment      Assessment:   1. Encounter Diagnoses  Name Primary?   Avulsion fracture of medial malleolus of right tibia, closed, with routine healing, subsequent encounter Yes   Severe ankle sprain, subsequent encounter         Plan:  Patient was evaluated and treated and all questions answered.  Assessment and Plan    Medial Malleolus Avulsion Fracture XR reviewed, healing well. Improving with non operative treatment. I recommended we transition to a lace up ankle stabilizing brace to continue support but allow ROM. PT referral placed.          No follow-ups on file.

## 2023-08-29 ENCOUNTER — Other Ambulatory Visit: Payer: Self-pay | Admitting: Family Medicine

## 2023-08-29 DIAGNOSIS — M1000 Idiopathic gout, unspecified site: Secondary | ICD-10-CM
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
## 2023-09-03 ENCOUNTER — Telehealth: Payer: Self-pay | Admitting: Family Medicine

## 2023-09-03 ENCOUNTER — Encounter: Payer: Self-pay | Admitting: Rehabilitative and Restorative Service Providers"

## 2023-09-03 ENCOUNTER — Ambulatory Visit (INDEPENDENT_AMBULATORY_CARE_PROVIDER_SITE_OTHER): Admitting: Rehabilitative and Restorative Service Providers"

## 2023-09-03 DIAGNOSIS — M6281 Muscle weakness (generalized): Secondary | ICD-10-CM | POA: Diagnosis not present

## 2023-09-03 DIAGNOSIS — M542 Cervicalgia: Secondary | ICD-10-CM

## 2023-09-03 DIAGNOSIS — R293 Abnormal posture: Secondary | ICD-10-CM

## 2023-09-03 NOTE — Telephone Encounter (Signed)
 Patient called and said she started experiencing pain in her right knee. It is very painful she is not sure what is going on. She has not been having pain until this weekend and is also having more pain and numbness in her right hand like it is falling asleep. Patient states that Dr. Alease Hunter has asked about it before and she was not experiencing it but it started on Saturday. She starts PT this morning for her neck and shoulders and back and has to be there at 9:15. She wants to make sure it is okay to proceed with that. She is not having more pain in neck and shoulder but hand and knee are bothering her really bad.  Please Advise.

## 2023-09-03 NOTE — Telephone Encounter (Signed)
 Called and spoke with patient.   I recommend that she keep PT appointment, they should be able to address the neck and n/t. Recommended eval at [facility] same day clinic or OV with Dr. Cleora Daft later this week for knee sx. Pt will keep us  update.

## 2023-09-04 ENCOUNTER — Ambulatory Visit (HOSPITAL_BASED_OUTPATIENT_CLINIC_OR_DEPARTMENT_OTHER): Admitting: Student
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
## 2023-09-06 ENCOUNTER — Ambulatory Visit: Admitting: Sports Medicine

## 2023-09-06 ENCOUNTER — Ambulatory Visit (INDEPENDENT_AMBULATORY_CARE_PROVIDER_SITE_OTHER)

## 2023-09-06 VITALS — BP 136/88 | HR 70 | Ht 67.0 in | Wt 278.0 lb

## 2023-09-06 DIAGNOSIS — G8929 Other chronic pain: Secondary | ICD-10-CM

## 2023-09-06 DIAGNOSIS — M25562 Pain in left knee: Secondary | ICD-10-CM

## 2023-09-06 DIAGNOSIS — M170 Bilateral primary osteoarthritis of knee: Secondary | ICD-10-CM | POA: Diagnosis not present

## 2023-09-06 DIAGNOSIS — M25561 Pain in right knee: Secondary | ICD-10-CM | POA: Diagnosis not present

## 2023-09-06 NOTE — Patient Instructions (Signed)
-   Start meloxicam 15 mg daily x2 weeks.  If still having pain after 2 weeks, complete 3rd-week of NSAID. May use remaining NSAID as needed once daily for pain control.  Do not to use additional over-the-counter NSAIDs (ibuprofen, naproxen, Advil, Aleve, etc.) while taking prescription NSAIDs.  May use Tylenol [phone] mg 2 to 3 times a day for breakthrough pain. Knee HEP See me again in 4 weeks

## 2023-09-10 ENCOUNTER — Encounter: Payer: Self-pay | Admitting: Sports Medicine

## 2023-09-10 ENCOUNTER — Other Ambulatory Visit: Payer: Self-pay | Admitting: Sports Medicine

## 2023-09-10 ENCOUNTER — Telehealth: Payer: Self-pay | Admitting: Family Medicine

## 2023-09-10 ENCOUNTER — Telehealth: Payer: Self-pay | Admitting: Sports Medicine

## 2023-09-10 MED ORDER — MELOXICAM 15 MG PO TABS: 15.0000 mg | ORAL_TABLET | Freq: Every day | ORAL | 0 refills | Status: DC

## 2023-09-10 NOTE — Telephone Encounter (Signed)
 Patient called again to follow up about the prescription that was supposed to be sent in last week.

## 2023-09-10 NOTE — Telephone Encounter (Signed)
 Copied from CRM [id number]. Topic: Clinical - Request for Lab/Test Order >> Sep 10, 2023  2:39 PM Opal Bill wrote: Reason for CRM: Patient asking if the lab orders can be sent to [facility] on [address] instead. Please follow up with patient.

## 2023-09-10 NOTE — Progress Notes (Signed)
 Meloxicam has been placed

## 2023-09-10 NOTE — Telephone Encounter (Signed)
 Patient called stating that Meloxicam  was not sent in from her visit last week. Can this be sent in for her please?

## 2023-09-11 NOTE — Telephone Encounter (Signed)
 Called pt, she states she is going to have to call back to schedule appt as she just starting PT and wants to see what her schedule for that looks like first. Advised pt that we will give her the lab orders to take w her at her appt.

## 2023-09-11 NOTE — Telephone Encounter (Signed)
 You wanted her to return for fasting 4 week f/u, you still would like ov correct? Do you want to wait for lab orders until appt?

## 2023-09-12 ENCOUNTER — Telehealth: Payer: Self-pay | Admitting: Family Medicine

## 2023-09-12 DIAGNOSIS — M79641 Pain in right hand: Secondary | ICD-10-CM

## 2023-09-12 NOTE — Telephone Encounter (Signed)
 Referral has been placed for hand therapy with Nate at [facility].

## 2023-09-12 NOTE — Telephone Encounter (Signed)
 Patient would like to do PT for her R hand. We referred to [facility] for other issues but they need a referral for her R hand in order to add this to her treatment. Please sent today if possible.

## 2023-09-13 ENCOUNTER — Encounter: Payer: Self-pay | Admitting: Rehabilitative and Restorative Service Providers"

## 2023-09-13 ENCOUNTER — Ambulatory Visit (INDEPENDENT_AMBULATORY_CARE_PROVIDER_SITE_OTHER): Admitting: Rehabilitative and Restorative Service Providers"

## 2023-09-13 DIAGNOSIS — M6281 Muscle weakness (generalized): Secondary | ICD-10-CM | POA: Diagnosis not present

## 2023-09-13 DIAGNOSIS — M542 Cervicalgia: Secondary | ICD-10-CM | POA: Diagnosis not present

## 2023-09-13 DIAGNOSIS — R293 Abnormal posture: Secondary | ICD-10-CM
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
## 2023-09-14 ENCOUNTER — Ambulatory Visit: Admitting: Family Medicine
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
## 2023-09-20 ENCOUNTER — Encounter: Payer: Self-pay | Admitting: Rehabilitative and Restorative Service Providers"

## 2023-09-20 ENCOUNTER — Ambulatory Visit (INDEPENDENT_AMBULATORY_CARE_PROVIDER_SITE_OTHER): Admitting: Rehabilitative and Restorative Service Providers"

## 2023-09-20 DIAGNOSIS — R293 Abnormal posture: Secondary | ICD-10-CM

## 2023-09-20 DIAGNOSIS — M6281 Muscle weakness (generalized): Secondary | ICD-10-CM

## 2023-09-20 DIAGNOSIS — M542 Cervicalgia: Secondary | ICD-10-CM
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
## 2023-09-21 ENCOUNTER — Encounter: Payer: Self-pay | Admitting: Rehabilitative and Restorative Service Providers"

## 2023-09-21 ENCOUNTER — Ambulatory Visit (INDEPENDENT_AMBULATORY_CARE_PROVIDER_SITE_OTHER): Admitting: Rehabilitative and Restorative Service Providers"

## 2023-09-21 DIAGNOSIS — M25641 Stiffness of right hand, not elsewhere classified: Secondary | ICD-10-CM | POA: Diagnosis not present

## 2023-09-21 DIAGNOSIS — M79641 Pain in right hand: Secondary | ICD-10-CM

## 2023-09-21 DIAGNOSIS — R202 Paresthesia of skin: Secondary | ICD-10-CM
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
## 2023-09-24 ENCOUNTER — Ambulatory Visit: Admitting: Family Medicine

## 2023-09-24 ENCOUNTER — Ambulatory Visit
Admission: RE | Admit: 2023-09-24 | Discharge: 2023-09-24 | Disposition: A | Discharge status: Home / Self Care | Source: Ambulatory Visit | Attending: Family Medicine | Admitting: Family Medicine

## 2023-09-24 ENCOUNTER — Other Ambulatory Visit: Payer: Self-pay | Admitting: Family Medicine

## 2023-09-24 VITALS — BP 126/80 | HR 62 | Ht 67.0 in | Wt 280.0 lb

## 2023-09-24 DIAGNOSIS — G8929 Other chronic pain: Secondary | ICD-10-CM

## 2023-09-24 DIAGNOSIS — M25571 Pain in right ankle and joints of right foot: Secondary | ICD-10-CM

## 2023-09-24 DIAGNOSIS — R921 Mammographic calcification found on diagnostic imaging of breast: Secondary | ICD-10-CM

## 2023-09-24 NOTE — Patient Instructions (Addendum)
 Thank you for coming in today.   I've referred you to Physical Therapy.  Let us  know if you don't hear from them in one week.   I can do knee injections any time you would like me too.   I think you may have some complex regional pain syndrome in the right ankle.   Complex Regional Pain Syndrome Complex regional pain syndrome (CRPS) is a nerve disorder that is characterized by long-term (chronic) pain. The pain is usually in a hand, arm, foot, or leg. CRPS usually occurs after an injury or trauma, such as a fracture or sprain. There are two types of CRPS: Type 1. This type occurs after an injury with no known damage to a nerve. Type 2. This type occurs after an injury that damages a nerve. There are three stages of the condition: Stage 1. This stage, called the acute stage, may last for up to 3 months. Stage 2. This stage, called the dystrophic stage, may last for 3-12 months. Stage 3. This stage, called the atrophic stage, may start after one year. CRPS ranges from mild to severe. For most people, CRPS is mild and recovery happens over time. For others, CRPS lasts for a very long time and makes it hard to do everyday tasks. What are the causes? The exact cause of this condition is not known. It is usually triggered by an injury. What increases the risk? You are more likely to develop this condition if: You are female. You have any of the following: A wrist fracture that involves a lower arm bone (distal radius fracture). Ankle dislocation or fracture. A long surgery time. Possible nerve injury during surgery. What are the signs or symptoms? Signs and symptoms in the affected hand, arm, foot, or leg are different for each stage. Signs and symptoms of stage 1 include: Spontaneous pain that feels like a burning or prickling, tingling feeling (pins and needles sensation). Extremely sensitive skin. Swelling. Joint stiffness. Warmth and redness. Excessive sweating. Hair and nail  growth that is faster than normal. Signs and symptoms of stage 2 include: Spreading of pain to the whole arm or leg. Increased skin sensitivity. Increased swelling and stiffness. Coolness of the skin. Blue discoloration of skin. Loss of skin wrinkles. Brittle fingernails. Signs and symptoms of stage 3 include: Pain that spreads to other areas of the body but becomes less severe. More stiffness, leading to loss of motion. Skin that is pale, dry, shiny, or tightly stretched. How is this diagnosed? This condition may be diagnosed based on: Your signs and symptoms. A physical exam. There is no test to diagnose CRPS, but you may have tests: To check for bone changes that might indicate CRPS. These tests may include an MRI or bone scan. To rule out other possible causes of your symptoms. How is this treated? Early treatment may prevent CRPS from advancing past stage 1. There is not one treatment that works for everyone. Treatment options may include: Medicines, which may include: NSAIDs, such as ibuprofen. Steroids. Blood pressure drugs. Antidepressants. Anti-seizure drugs. Pain relievers. Exercise. Occupational therapy and physical therapy. Biofeedback. Mental health counseling. Numbing injections. Spinal surgery to implant a spinal cord stimulator or a pain pump. Follow these instructions at home: Medicines Take over-the-counter and prescription medicines only as told by your health care provider. Ask your health care provider if the medicine prescribed to you: Requires you to avoid driving or using machinery. Can cause constipation. You may need to take these actions to prevent or  treat constipation: Drink enough fluid to keep your urine pale yellow. Take over-the-counter or prescription medicines. Eat foods that are high in fiber, such as beans, whole grains, and fresh fruits and vegetables. Limit foods that are high in fat and processed sugars, such as fried or sweet  foods. General instructions Do not use any products that contain nicotine or tobacco. These products include cigarettes, chewing tobacco, and vaping devices, such as e-cigarettes. If you need help quitting, ask your health care provider. Maintain a healthy weight. Return to your normal activities as told by your health care provider. Ask your health care provider what activities are safe for you. Do exercises as told by your health care provider. Keep all follow-up visits. This is important. Where to find more information [employer] of Neurological Disorders and Stroke: [employer] Contact a health care provider if: Your symptoms change. Your symptoms get worse. You develop anxiety or depression. Get help right away if: Your pain is making you want to harm yourself. Get help right away if you feel like you may hurt yourself or others, or have thoughts about taking your own life. Go to your nearest emergency room or: Call 911. Call the National Suicide Prevention Lifeline at (508)712-1606 or 988. This is open 24 hours a day. Text the Crisis Text Line at 443-630-4101. Summary Complex regional pain syndrome (CRPS) is a nerve disorder that causes long-term (chronic) pain, usually in a hand, arm, leg, or foot. CRPS usually occurs after an injury or trauma, such as a fracture or sprain. CRPS ranges from mild to severe. Early treatment may prevent CRPS from advancing to more severe stages. This information is not intended to replace advice given to you by your health care provider. Make sure you discuss any questions you have with your health care provider. Document Revised: 01/05/2021 Document Reviewed: 01/05/2021 Elsevier Patient Education  2024 [employer].
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
## 2023-09-27 ENCOUNTER — Encounter: Payer: Self-pay | Admitting: Rehabilitative and Restorative Service Providers"

## 2023-09-27 ENCOUNTER — Ambulatory Visit: Admitting: Rehabilitative and Restorative Service Providers"

## 2023-09-27 ENCOUNTER — Ambulatory Visit (INDEPENDENT_AMBULATORY_CARE_PROVIDER_SITE_OTHER): Admitting: Rehabilitative and Restorative Service Providers"

## 2023-09-27 DIAGNOSIS — R293 Abnormal posture: Secondary | ICD-10-CM | POA: Diagnosis not present

## 2023-09-27 DIAGNOSIS — M79641 Pain in right hand: Secondary | ICD-10-CM

## 2023-09-27 DIAGNOSIS — M542 Cervicalgia: Secondary | ICD-10-CM

## 2023-09-27 DIAGNOSIS — M6281 Muscle weakness (generalized): Secondary | ICD-10-CM | POA: Diagnosis not present

## 2023-09-27 DIAGNOSIS — M25641 Stiffness of right hand, not elsewhere classified: Secondary | ICD-10-CM

## 2023-09-27 DIAGNOSIS — R202 Paresthesia of skin: Secondary | ICD-10-CM
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
## 2023-10-03 ENCOUNTER — Ambulatory Visit: Admitting: Sports Medicine

## 2023-10-03 VITALS — BP 150/80 | HR 84 | Ht 67.0 in | Wt 283.4 lb

## 2023-10-03 DIAGNOSIS — G8929 Other chronic pain: Secondary | ICD-10-CM | POA: Diagnosis not present

## 2023-10-03 DIAGNOSIS — M170 Bilateral primary osteoarthritis of knee: Secondary | ICD-10-CM

## 2023-10-03 DIAGNOSIS — M25562 Pain in left knee: Secondary | ICD-10-CM

## 2023-10-03 DIAGNOSIS — M25561 Pain in right knee: Secondary | ICD-10-CM | POA: Diagnosis not present
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
## 2023-10-03 NOTE — Patient Instructions (Addendum)
 Injected knee today. Discontinue meloxicam . - Start Tylenol  500 to 1000 mg tablets 2-3 times a day for day-to-day pain relief Follow up in 3 weeks.

## 2023-10-04 ENCOUNTER — Ambulatory Visit (INDEPENDENT_AMBULATORY_CARE_PROVIDER_SITE_OTHER): Admitting: Podiatry

## 2023-10-04 ENCOUNTER — Encounter: Payer: Self-pay | Admitting: Rehabilitative and Restorative Service Providers"

## 2023-10-04 ENCOUNTER — Ambulatory Visit: Admitting: Rehabilitative and Restorative Service Providers"

## 2023-10-04 ENCOUNTER — Encounter: Payer: Self-pay | Admitting: Podiatry

## 2023-10-04 ENCOUNTER — Ambulatory Visit (INDEPENDENT_AMBULATORY_CARE_PROVIDER_SITE_OTHER): Admitting: Rehabilitative and Restorative Service Providers"

## 2023-10-04 VITALS — Ht 67.0 in | Wt 283.0 lb

## 2023-10-04 DIAGNOSIS — M79641 Pain in right hand: Secondary | ICD-10-CM | POA: Diagnosis not present

## 2023-10-04 DIAGNOSIS — R202 Paresthesia of skin: Secondary | ICD-10-CM

## 2023-10-04 DIAGNOSIS — M542 Cervicalgia: Secondary | ICD-10-CM

## 2023-10-04 DIAGNOSIS — R293 Abnormal posture: Secondary | ICD-10-CM

## 2023-10-04 DIAGNOSIS — M6281 Muscle weakness (generalized): Secondary | ICD-10-CM

## 2023-10-04 DIAGNOSIS — M25641 Stiffness of right hand, not elsewhere classified: Secondary | ICD-10-CM

## 2023-10-04 DIAGNOSIS — S93409D Sprain of unspecified ligament of unspecified ankle, subsequent encounter: Secondary | ICD-10-CM

## 2023-10-04 DIAGNOSIS — S8251XD Displaced fracture of medial malleolus of right tibia, subsequent encounter for closed fracture with routine healing: Secondary | ICD-10-CM
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
## 2023-10-11 ENCOUNTER — Ambulatory Visit (INDEPENDENT_AMBULATORY_CARE_PROVIDER_SITE_OTHER): Admitting: Rehabilitative and Restorative Service Providers"

## 2023-10-11 ENCOUNTER — Encounter: Admitting: Rehabilitative and Restorative Service Providers"

## 2023-10-11 ENCOUNTER — Encounter: Payer: Self-pay | Admitting: Rehabilitative and Restorative Service Providers"

## 2023-10-11 DIAGNOSIS — R202 Paresthesia of skin: Secondary | ICD-10-CM

## 2023-10-11 DIAGNOSIS — M25641 Stiffness of right hand, not elsewhere classified: Secondary | ICD-10-CM | POA: Diagnosis not present

## 2023-10-11 DIAGNOSIS — M79641 Pain in right hand: Secondary | ICD-10-CM

## 2023-10-11 DIAGNOSIS — M6281 Muscle weakness (generalized): Secondary | ICD-10-CM | POA: Diagnosis not present

## 2023-10-12 ENCOUNTER — Ambulatory Visit (INDEPENDENT_AMBULATORY_CARE_PROVIDER_SITE_OTHER): Admitting: Rehabilitative and Restorative Service Providers"

## 2023-10-12 ENCOUNTER — Encounter: Payer: Self-pay | Admitting: Rehabilitative and Restorative Service Providers"

## 2023-10-12 DIAGNOSIS — M542 Cervicalgia: Secondary | ICD-10-CM

## 2023-10-12 DIAGNOSIS — M6281 Muscle weakness (generalized): Secondary | ICD-10-CM

## 2023-10-12 DIAGNOSIS — R293 Abnormal posture: Secondary | ICD-10-CM | POA: Diagnosis not present
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
## 2023-10-18 ENCOUNTER — Encounter: Admitting: Rehabilitative and Restorative Service Providers"

## 2023-10-18 ENCOUNTER — Encounter: Payer: Self-pay | Admitting: Rehabilitative and Restorative Service Providers"

## 2023-10-18 ENCOUNTER — Ambulatory Visit: Admitting: Rehabilitative and Restorative Service Providers"

## 2023-10-18 DIAGNOSIS — M6281 Muscle weakness (generalized): Secondary | ICD-10-CM | POA: Diagnosis not present

## 2023-10-18 DIAGNOSIS — M542 Cervicalgia: Secondary | ICD-10-CM | POA: Diagnosis not present

## 2023-10-18 DIAGNOSIS — R293 Abnormal posture: Secondary | ICD-10-CM | POA: Diagnosis not present
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
## 2023-10-22 ENCOUNTER — Ambulatory Visit: Admitting: Family Medicine

## 2023-10-22 ENCOUNTER — Ambulatory Visit: Payer: Self-pay

## 2023-10-22 ENCOUNTER — Ambulatory Visit

## 2023-10-22 VITALS — BP 154/94 | HR 73 | Ht 67.0 in | Wt 286.0 lb

## 2023-10-22 DIAGNOSIS — M25562 Pain in left knee: Secondary | ICD-10-CM | POA: Diagnosis not present

## 2023-10-22 DIAGNOSIS — G8929 Other chronic pain: Secondary | ICD-10-CM

## 2023-10-22 DIAGNOSIS — M25571 Pain in right ankle and joints of right foot: Secondary | ICD-10-CM

## 2023-10-22 NOTE — Patient Instructions (Addendum)
 Thank you for coming in today.   Please get an Xray today before you leave   You should hear from MRI scheduling within 1 week. If you do not hear please let me know.    Check back mid-July

## 2023-10-22 NOTE — Progress Notes (Signed)
 Joanna Muck, PhD, LAT, ATC acting as a scribe for Garlan Juniper, MD.  Darlisha Kaisey Huseby is a 53 y.o. female who presents to [facility] today for 1-mont f/u R ankle pain. Pt was last seen by Dr. Alease Hunter on 09/24/23 and was we asked PT to also work on her ankle, completing 7 visits.  Today, pt reports R ankle is only slightly better and still painful. She notes weakness in her R knee. She also c/o L knee pain. She is scheduled to see Dr. Cleora Daft on Wednesday.   Dx imaging: 08/23/23 R ankle & foot XR  Pertinent review of systems: No fevers or chills  Relevant historical information: Diabetes and hypertension.   Exam:  BP (!) 154/94   Pulse 73   Ht 5\' 7"  (1.702 m)   Wt 286 lb (129.7 kg)   SpO2 98%   BMI 44.79 kg/m  General: Well Developed, well nourished, and in no acute distress.   MSK: Right ankle tender palpation medial ankle.  Normal foot and ankle motion.  Intact strength.  Left knee normal-appearing nontender palpation normal motion.    Lab and Radiology Results  Procedure: Real-time Ultrasound Guided Injection of left knee joint superior lateral patella space Device: Philips Affiniti 50G/GE Logiq Images permanently stored and available for review in PACS Verbal informed consent obtained.  Discussed risks and benefits of procedure. Warned about infection, bleeding, hyperglycemia damage to structures among others. Patient expresses understanding and agreement Time-out conducted.   Noted no overlying erythema, induration, or other signs of local infection.   Skin prepped in a sterile fashion.   Local anesthesia: Topical Ethyl chloride.   With sterile technique and under real time ultrasound guidance: 40 mg of Kenalog and 2 mL of Marcaine  injected into knee joint. Fluid seen entering the joint capsule.   Completed without difficulty   Pain immediately resolved suggesting accurate placement of the medication.   Advised to call if  fevers/chills, erythema, induration, drainage, or persistent bleeding.   Images permanently stored and available for review in the ultrasound unit.  Impression: Technically successful ultrasound guided injection.      Assessment and Plan: 53 y.o. female with chronic left knee pain.  X-ray left knee on September 06, 2023 shows severe arthritis which is the source of knee pain.  Plan for steroid injection left knee today.  Consider hyaluronic acid or Zilretta  injections in the future.  Additionally we will proceed to MRI of the right ankle.  It is not better at off after physical therapy.  This is currently predominantly managed by podiatry Dr. Michalene Agee.  She has an appointment with him on November 01, 2023.  I am hopeful that I can get an MRI back in time for her appointment with Dr. Michalene Agee.  Will communicate with him as well.   PDMP not reviewed this encounter. Orders Placed This Encounter  Procedures   US  LIMITED JOINT SPACE STRUCTURES LOW RIGHT(NO LINKED CHARGES)    Reason for Exam (SYMPTOM  OR DIAGNOSIS REQUIRED):   right ankle pain    Preferred imaging location?:   [facility] Knee AP/LAT W/[place] Left    Standing Status:   Future    Number of Occurrences:   1    Expiration Date:   11/21/2023    Reason for Exam (SYMPTOM  OR DIAGNOSIS REQUIRED):   left knee pain    Preferred imaging location?:   [facility]  Is patient pregnant?:   No   MR ANKLE RIGHT WO CONTRAST    Standing Status:   Future    Expiration Date:   10/21/2024    What is the patient's sedation requirement?:   No Sedation    Does the patient have a pacemaker or implanted devices?:   No    Preferred imaging location?:   [facility] (table limit-550lbs)   No orders of the defined types were placed in this encounter.    Discussed warning signs or symptoms. Please see discharge instructions. Patient expresses understanding.   The above documentation has been reviewed and is  accurate and complete Garlan Juniper, M.D.

## 2023-10-24 ENCOUNTER — Ambulatory Visit: Admitting: Sports Medicine

## 2023-10-25 ENCOUNTER — Encounter: Admitting: Rehabilitative and Restorative Service Providers"

## 2023-10-27 ENCOUNTER — Ambulatory Visit
Admission: RE | Admit: 2023-10-27 | Discharge: 2023-10-27 | Disposition: A | Discharge status: Home / Self Care | Source: Ambulatory Visit | Attending: Family Medicine | Admitting: Family Medicine

## 2023-10-27 DIAGNOSIS — G8929 Other chronic pain: Secondary | ICD-10-CM

## 2023-10-30 ENCOUNTER — Telehealth: Payer: Self-pay

## 2023-10-30 ENCOUNTER — Ambulatory Visit: Payer: Self-pay | Admitting: Family Medicine

## 2023-10-30 NOTE — Progress Notes (Signed)
 MRI of the ankle shows tendinitis and a partial split tear of the posterior tibialis tendon and Achilles tendinitis without tear.  I will make sure your podiatrist gets a copy of this MRI for your visit on June 12.

## 2023-10-30 NOTE — Telephone Encounter (Signed)
 Patient left message requesting instructions following recent MRI. Per chart review MRI ordered by Dr. Aline Apo and their office was contacted by her as well. Please review MRI and advise on next steps.   Thank you.

## 2023-10-30 NOTE — Telephone Encounter (Signed)
 My understanding was that podiatry was managing this problem.  I am absolutely happy to take over if you would like me to.  Please schedule an appointment with me at your convenience.  We can look at the MRI together and talk about what to do including possibly an injection.

## 2023-10-30 NOTE — Telephone Encounter (Signed)
-----   Message from Mena Stai sent at 10/30/2023  8:38 AM EDT ----- Pt called about these results. She is not clear what her next step should be? Is this tear supposed to be managed by podiatry? She canceled the appointment for 6/12.

## 2023-10-31 ENCOUNTER — Telehealth: Payer: Self-pay | Admitting: Family Medicine

## 2023-10-31 NOTE — Telephone Encounter (Signed)
 Pt states that her podiatrist is recommending ankle surgery, per notes in EPIC. She has not scheduled the consult yet.  Pt really does NOT want to have surgery. She lives with stairs and cannot afford to be out of work.  She wanted to get Dr. Jola Nash opinion on surgery vs other options. Offered appt, pt declined.

## 2023-10-31 NOTE — Telephone Encounter (Signed)
 Forwarding to Dr. Denyse Amass to review and advise.

## 2023-11-01 ENCOUNTER — Encounter: Admitting: Rehabilitative and Restorative Service Providers"

## 2023-11-01 ENCOUNTER — Ambulatory Visit: Admitting: Podiatry

## 2023-11-01 NOTE — Telephone Encounter (Signed)
 We could try an injection at the tendon.  If that does not work we probably need surgery.

## 2023-11-05 ENCOUNTER — Ambulatory Visit (INDEPENDENT_AMBULATORY_CARE_PROVIDER_SITE_OTHER): Admitting: Family Medicine

## 2023-11-05 ENCOUNTER — Other Ambulatory Visit: Payer: Self-pay

## 2023-11-05 VITALS — BP 154/94 | HR 74 | Ht 67.0 in | Wt 286.0 lb

## 2023-11-05 DIAGNOSIS — M25561 Pain in right knee: Secondary | ICD-10-CM

## 2023-11-05 DIAGNOSIS — G8929 Other chronic pain: Secondary | ICD-10-CM

## 2023-11-05 NOTE — Patient Instructions (Addendum)
 Thank you for coming in today.   Let us  know if you would like to do

## 2023-11-05 NOTE — Progress Notes (Signed)
 Joanna Muck, PhD, LAT, ATC acting as a scribe for Garlan Juniper, MD.  Janice Huang is a 53 y.o. female who presents to [facility] at [facility] today for f/u R ankle pain w/ MRI review. Pt was last seen by Dr. Alease Hunter on 10/22/23 and ankle MRI was ordered.   Today, pt reports R ankle is very painful. She has been wearing her ankle brace. She is now having pain on the plantar aspect of her R foot that started this weekend.  She has a follow-up appointment with podiatry for the 26 to discuss surgery or other procedures.  Dx imaging: 10/27/23 R ankle MRI 08/23/23 R ankle & foot XR   Pertinent review of systems: No fevers or chills  Relevant historical information: Hypertension obesity diabetes.   Exam:  BP (!) 154/94   Pulse 74   Ht 5' 7 (1.702 m)   Wt 286 lb (129.7 kg)   SpO2 98%   BMI 44.79 kg/m  General: Well Developed, well nourished, and in no acute distress.   MSK: Rt ankle tender palpation medial malleolus.    Lab and Radiology Results  MR ANKLE WITHOUT IV CONTRAST RIGHT   COMPARISON: None.   CLINICAL HISTORY: Achilles tendinitis, chronic right ankle pain.   PULSE SEQUENCES: Ax T1, Ax T2 FS, Sag T1, Sag T2 FS, Cor STIR, Ax T1 FS   FINDINGS:   Bones: There is moderate edema in the medial malleolus which is likely reactive from posterior tibial tendinitis. There is mild tenosynovitis in the posterior tibial tendon and the medial retromalleolar groove. There is no visualized fracture. Otherwise, the bones are unremarkable. There is no fracture or contusion pattern. No significant joint effusion is present. No accelerated arthrosis is identified.   Ligaments: The anterior and posterior tibiofibular and talofibular ligaments are intact. Deltoid ligament and spring ligaments are intact. The sinus tarsi is unremarkable.   Musculotendinous structures: The tibialis anterior, extensor digitorum and extensor hallucis longus tendons are  unremarkable. There is mild-to-moderate tenosynovitis of the posterior tibial tendon and the medial retromalleolar groove with mild thickening of the posterior tibial tendon consistent with tendinosis. Mild intrasubstance partial tear is present. No full-thickness posterior tibial tendon tear is identified. The flexor hallucis longus and flexor digitorum tendons are unremarkable. Peroneal tendons demonstrate no significant abnormality. No significant tenosynovitis or tendinosis. There is mild increased signal in the distal Achilles tendon without significant tear or tendinosis. Plantar fascia is unremarkable.   IMPRESSION: Tenosynovitis in the posterior tibial tendon with a mild split tear as above. No full-thickness tear is present.   There is significant reactive edema in the posterior medial malleolar groove and adjacent medial malleolus.   Insertional tendinosis of the Achilles tendon without significant partial or full-thickness tear. No change in morphology of the Achilles tendon.   Electronically signed by: Adrien Alberta MD 10/29/2023 11:20 AM EDT RP Workstation: [id number]  Dione Franks, personally (independently) visualized and performed the interpretation of the images attached in this note.      Assessment and Plan: 53 y.o. female with right ankle pain due to posterior tibialis tenosynovitis and partial linear split tear.  Unfortunately this is not improving with typical conservative management.  At this point she would benefit from either injection or surgery.  I am not optimistic that an injection is going to provide lasting relief.  Recommend that she keep her appointment with podiatry on the 26th discussed options.  I do not want to do an injection  today that we will spoil her ability to have surgery at that visit in 10 days.  I think she is getting the correct treatment.  Happy to provide a second surgical opinion if needed although I think she is in good hands with her  current podiatrist.   PDMP not reviewed this encounter. No orders of the defined types were placed in this encounter.  No orders of the defined types were placed in this encounter.    Discussed warning signs or symptoms. Please see discharge instructions. Patient expresses understanding.   The above documentation has been reviewed and is accurate and complete Garlan Juniper, M.D. Total encounter time 30 minutes including face-to-face time with the patient and, reviewing past medical record, and charting on the date of service.

## 2023-11-08 ENCOUNTER — Encounter: Admitting: Rehabilitative and Restorative Service Providers"

## 2023-11-15 ENCOUNTER — Encounter: Payer: Self-pay | Admitting: Podiatry

## 2023-11-15 ENCOUNTER — Ambulatory Visit (INDEPENDENT_AMBULATORY_CARE_PROVIDER_SITE_OTHER): Admitting: Podiatry

## 2023-11-15 VITALS — Ht 67.0 in | Wt 286.0 lb

## 2023-11-15 DIAGNOSIS — S8251XD Displaced fracture of medial malleolus of right tibia, subsequent encounter for closed fracture with routine healing: Secondary | ICD-10-CM

## 2023-11-15 DIAGNOSIS — M66871 Spontaneous rupture of other tendons, right ankle and foot: Secondary | ICD-10-CM | POA: Diagnosis not present

## 2023-11-15 DIAGNOSIS — M25871 Other specified joint disorders, right ankle and foot: Secondary | ICD-10-CM | POA: Diagnosis not present

## 2023-11-15 DIAGNOSIS — M25371 Other instability, right ankle: Secondary | ICD-10-CM

## 2023-11-19 NOTE — Patient Instructions (Addendum)
  VISIT SUMMARY: Today, we discussed your persistent right ankle pain and instability, which has not improved with previous treatments. We reviewed your MRI results showing a split tear in the posterior tibial tendon and significant inflammation around the deltoid ligament. We also discussed your well-controlled diabetes and the importance of monitoring your A1c levels before surgery.  YOUR PLAN: -POSTERIOR TIBIAL TENDON TEAR: You have a chronic split tear in the posterior tibial tendon, which is causing significant pain and swelling. We recommend surgical repair to address this issue.  This portion of the surgery will involve repairing the tear in the tendon. Most patients experience improvement after surgery. Recovery will include non-weight bearing for about a month, followed by weight bearing in a boot and physical therapy.  Expect physical therapy for 6 to 12 weeks after surgery  -INSUFFICIENCY OF THE DELTOID LIGAMENT: Your MRI shows inflammation and fluid around the ligament on the inside of the ankle under the tendon that has pain.  This is called the deltoid ligament.  Part of the surgery will be examining the ligament to see if it is stable. If necessary, we will repair it to improve stability and reduce pain.  -ANKLE IMPINGEMENT SYNDROME: Scar tissue and inflammation in the front of the ankle can cause pain in the front of the ankle joint.  This will be evaluated arthroscopically through 2 small incisions to remove any of these as well as inspect the ankle joint itself for any cartilage damage that was not apparent on your MRI  -TYPE 2 DIABETES MELLITUS WITHOUT COMPLICATIONS: Your diabetes is well-controlled with an A1c between 6.1 and 6.4. It is important to monitor your A1c levels before surgery to ensure optimal outcomes. Please have your A1c checked by your primary care doctor before the surgery.  INSTRUCTIONS: Please follow up with your primary care doctor to check your A1c levels before  your surgery. Your surgery will involve a period of non-weight bearing for about a month, followed by weight bearing in a boot and physical therapy. Make sure to arrange for support at home, especially for navigating stairs and transportation, as you will not be able to drive post-surgery.                      Contains text generated by Abridge.                                 Contains text generated by Abridge.

## 2023-11-19 NOTE — Progress Notes (Signed)
 Subjective:  Patient ID: Janice Huang, female    DOB: Aug 08, 1970,  MRN: [id number]  Chief Complaint  Patient presents with   Ankle Injury    Pt is here to discuss MRI results she states that the pain in her ankle is sometimes unbearable, started back wearing brace because that helps a little.    Discussed the use of AI scribe software for clinical note transcription with the patient, who gave verbal consent to proceed.  History of Present Illness Janice Huang is a 53 year old female with diabetes who presents with persistent right ankle pain and instability.  She experiences persistent pain in the right ankle, specifically in the area of the posterior tibial tendon. The pain is localized on the inside of the ankle, with no pain on the outside. Despite previous treatments, including wearing a boot and allowing time for healing, the pain has not improved.  An MRI conducted on October 27, 2023, revealed a split tear in the posterior tibial tendon and significant inflammation around the deltoid ligament. She manages her condition with a boot during activities that require prolonged walking and uses a rolling knee scooter for mobility. She is concerned about her ability to manage daily activities, especially with the presence of stairs in her home.  Her past medical history includes diabetes, with her last A1c recorded between 6.1 and 6.4 in December. She has an upcoming appointment with her primary care provider on December 19, 2023, to reassess her A1c levels.  She works from home and has expressed concerns about managing her work and daily activities post-surgery, especially with the need to navigate stairs. She has support at home, including her son who can assist with transportation as she will be unable to drive post-surgery.  No pain on the outside of the ankle. Reports swelling and tenderness in the anterior ankle joint, worsening with dorsiflexion.      Objective:     Physical Exam VASCULAR: DP and PT pulse palpable. Foot is warm and well-perfused. Capillary fill time is brisk. DERMATOLOGIC: Normal skin turgor, texture, and temperature. No open lesions, rashes, or ulcerations. NEUROLOGIC: Normal sensation to light touch and pressure. No paresthesias. ORTHOPEDIC: Pain on palpation of deltoid ligament and posterior tibial tendon, worse with eversion. Tenderness along posterior tibial tendon in retromalleolar groove distal to navicular. Slight tenderness and swelling in anterior ankle joint, worse with dorsiflexion. No ecchymosis or bruising. No gross deformity.   No images are attached to the encounter.    Results LABS HbA1c: 6.1-6.4% (04/2023)  RADIOLOGY MRI of the right ankle: Tenosynovitis in the posterior tibial tendon with a split tear. Significant reactive edema in the posterior medial malleolar groove and adjacent beta malleolus. Insufficiency of the deltoid ligament. Significant amount of edema in the medial malleolus at its deep and superficial attachments. Debris and impinging structures within the medial gutter. (10/27/2023)   Assessment:   1. Avulsion fracture of medial malleolus of right tibia, closed, with routine healing, subsequent encounter   2. Instability of right ankle joint   3. Tibialis posterior tendon tear, nontraumatic, right   4. Ankle impingement syndrome, right      Plan:  Patient was evaluated and treated and all questions answered.  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 Symptoms have not improved with non-surgical treatment and physical therapy. - Recommend surgical repair of  the posterior tibial tendon. - Discussed surgical procedure including arthroscopy to evaluate the joint, debride any impinging structures, and evaluate the deltoid  ligament. - Discussed risks, including potential complications such as infection and non-compliance affecting outcomes. Emphasized that the overwhelming majority of patients experience significant improvement and return to activity post-surgery. - Discussed recovery process including non-weight bearing for approximately one month, followed by weight bearing in a boot and physical therapy. - Informed consent signed and reviewed. - Discussed option of a second opinion.  Insufficiency of the deltoid ligament Suspected insufficiency of the deltoid ligament with significant edema in the medial malleolus at its deep and superficial attachments. Symptoms have not improved with non-surgical treatment. - Plan to evaluate the deltoid ligament arthroscopically and with radiographic stress examination during surgery. - If torn, plan to repair the deltoid ligament during surgery.  Ankle impingement syndrome Presence of debris and impinging structures within the medial gutter contributing to ankle impingement syndrome. Symptoms have not improved with non-surgical treatment. - Plan to debride any impinging structures during arthroscopy.  Type 2 diabetes mellitus without complications Type 2 diabetes mellitus is currently well-controlled with an A1c of approximately 6.1 to 6.4. Last checked in December. Monitoring is essential to ensure optimal surgical outcomes. - Recommend checking A1c again before surgery, which can be done by her primary care doctor.     Surgical plan:  Procedure: - Right ankle arthroscopy, PT tendon repair, stress exam and possible deltoid repair  Location: - [facility]  Anesthesia plan: - General With regional block  Postoperative pain plan: - Tylenol  1000 mg every 6 hours, ibuprofen 600 mg every 6 hours, gabapentin 300 mg every 8 hours x5 days, oxycodone  5 mg 1-2 tabs every 6 hours only as needed  DVT prophylaxis: - Aspirin 325 mg twice daily postop  WB Restrictions / DME  needs: - Nonweightbearing in splint postop with knee scooter   No follow-ups on file.

## 2023-11-19 NOTE — Addendum Note (Signed)
 Addended byBETHA MEDICINE, Zadin Lange R on: 11/19/2023 02:52 PM   Modules accepted: Orders

## 2023-11-20 ENCOUNTER — Telehealth: Payer: Self-pay | Admitting: Podiatry

## 2023-11-20 NOTE — Telephone Encounter (Signed)
 Patient asking for surgery code

## 2023-11-22 ENCOUNTER — Encounter: Admitting: Rehabilitative and Restorative Service Providers"

## 2023-11-30 ENCOUNTER — Ambulatory Visit: Admitting: Family Medicine

## 2023-12-03 ENCOUNTER — Ambulatory Visit: Admitting: Family Medicine

## 2023-12-17 ENCOUNTER — Telehealth: Payer: Self-pay | Admitting: Podiatry

## 2023-12-17 NOTE — Telephone Encounter (Signed)
 Pt called and wanted to let you know she has decided not to have surgery with our office she is going to have Dr Kit do the surgery at the ortho dept.

## 2023-12-20 ENCOUNTER — Encounter: Payer: Self-pay | Admitting: Family Medicine

## 2023-12-20 ENCOUNTER — Other Ambulatory Visit: Payer: Self-pay | Admitting: Family Medicine

## 2023-12-20 ENCOUNTER — Ambulatory Visit (INDEPENDENT_AMBULATORY_CARE_PROVIDER_SITE_OTHER): Admitting: Family Medicine

## 2023-12-20 ENCOUNTER — Ambulatory Visit (INDEPENDENT_AMBULATORY_CARE_PROVIDER_SITE_OTHER)

## 2023-12-20 VITALS — BP 126/80 | HR 65 | Temp 97.6°F | Ht 67.0 in | Wt 289.0 lb

## 2023-12-20 DIAGNOSIS — M1000 Idiopathic gout, unspecified site: Secondary | ICD-10-CM

## 2023-12-20 DIAGNOSIS — E7800 Pure hypercholesterolemia, unspecified: Secondary | ICD-10-CM

## 2023-12-20 DIAGNOSIS — E669 Obesity, unspecified: Secondary | ICD-10-CM

## 2023-12-20 DIAGNOSIS — M25571 Pain in right ankle and joints of right foot: Secondary | ICD-10-CM

## 2023-12-20 DIAGNOSIS — G8929 Other chronic pain: Secondary | ICD-10-CM

## 2023-12-20 DIAGNOSIS — I10 Essential (primary) hypertension: Secondary | ICD-10-CM

## 2023-12-20 DIAGNOSIS — Z0000 Encounter for general adult medical examination without abnormal findings: Secondary | ICD-10-CM

## 2023-12-20 DIAGNOSIS — Z01810 Encounter for preprocedural cardiovascular examination: Secondary | ICD-10-CM | POA: Diagnosis not present

## 2023-12-20 DIAGNOSIS — R9431 Abnormal electrocardiogram [ECG] [EKG]: Secondary | ICD-10-CM

## 2023-12-20 DIAGNOSIS — E559 Vitamin D deficiency, unspecified: Secondary | ICD-10-CM

## 2023-12-20 DIAGNOSIS — Z01818 Encounter for other preprocedural examination: Secondary | ICD-10-CM | POA: Insufficient documentation

## 2023-12-20 DIAGNOSIS — E1169 Type 2 diabetes mellitus with other specified complication: Secondary | ICD-10-CM

## 2023-12-20 DIAGNOSIS — Z0001 Encounter for general adult medical examination with abnormal findings: Secondary | ICD-10-CM

## 2023-12-20 DIAGNOSIS — Z9884 Bariatric surgery status: Secondary | ICD-10-CM

## 2023-12-20 LAB — ECHOCARDIOGRAM COMPLETE
AR max vel: 1.66 cm2
AV Area VTI: 1.72 cm2
AV Area mean vel: 1.54 cm2
AV Mean grad: 4.5 mmHg
AV Peak grad: 7.8 mmHg
Ao pk vel: 1.4 m/s
Area-P 1/2: 3.77 cm2
Height: 67 in
S' Lateral: 3.03 cm
Weight: 4624 [oz_av]

## 2023-12-20 NOTE — Progress Notes (Signed)
 I fixed it I figured out  Complete physical exam  Patient: Janice Huang   DOB: [birth year]   53 y.o. Female  MRN: [id number]  Subjective:    Chief Complaint  Patient presents with   Annual Exam    fasting   Discussed the use of AI scribe software for clinical note transcription with the patient, who gave verbal consent to proceed.  History of Present Illness Janice Huang is a 53 year old female who presents for a follow-up regarding her upcoming ankle surgery and for fasting CPE.   Right ankle tendon and ligament injury - Tear in right ankle tendon and ligament confirmed by MRI - Scheduled for surgical intervention with screw placement - Previous physical therapy did not result in significant improvement  Glycemic control and weight management - Not currently on diabetes medication - Uses Wegovy  0.5 mg since March - Recent weight gain of three pounds - Last hemoglobin A1c was 6.1% in December 2024 - Fasting today for blood work to assess cholesterol and A1c levels  Gout management - Takes allopurinol  daily for gout - Uses colchicine  as needed - No recent gout flare-ups  Hyperlipidemia - Takes atorvastatin  for elevated cholesterol - History of LDL levels over 190 - Fasting today for cholesterol assessment  Vitamin d  deficiency - Vitamin D  level was 24 in December 2024 - Uncertain about current vitamin D  supplementation  Musculoskeletal symptoms - History of knee issues treated with injections, currently paused due to upcoming surgery - Experiences upper body stiffness and occasional hand issues  Cardiopulmonary symptoms - No chest pain, palpitations, or shortness of breath  Preventive health maintenance - Up to date with dental examinations - Upcoming gynecological oncology appointment - Monitored for breast spots and due for mammogram in October - Eye exam completed in February and follow-up   [place] [facility] - [facility] - [facility]    Ankle surgery September 30th 2025      Health Maintenance  Topic Date Due   Hepatitis B Vaccine (1 of 3 - 19+ 3-dose series) Never done   Pneumococcal Vaccine for high risk medical condition (2 of 2 - PCV) 01/17/2014   Pneumococcal Vaccine for age over 17 (2 of 2 - PCV) 01/17/2014   Eye exam for diabetics  05/10/2017   Complete foot exam   07/24/2019   Zoster (Shingles) Vaccine (1 of 2) Never done   Hemoglobin A1C  11/01/2023   Pap with HPV screening  12/11/2023   COVID-19 Vaccine (5 - 2024-25 season) 01/05/2024*   Flu Shot  12/21/2023   Yearly kidney function blood test for diabetes  05/02/2024   Yearly kidney health urinalysis for diabetes  05/02/2024   Mammogram  03/22/2025   Cologuard (Stool DNA test)  11/07/2025   DTaP/Tdap/Td vaccine (3 - Td or Tdap) 01/24/2028   Hepatitis C Screening  Completed   HIV Screening  Completed   HPV Vaccine  Aged Out   Meningitis B Vaccine  Aged Out  *Topic was postponed. The date shown is not the original due date.    Wears seatbelt always, uses sunscreen, smoke detectors in home and functioning, does not text while driving, feels safe in home environment.  Depression screening:    12/20/2023    8:25 AM 08/17/2023    2:34 PM 05/02/2023    3:51 PM  Depression screen PHQ 2/9  Decreased Interest 0 0 0  Down, Depressed, Hopeless 0 0 0  PHQ - 2 Score  0 0 0   Anxiety Screening:     No data to display          Vision:Within last year and Dental: No current dental problems and Receives regular dental care  Patient Active Problem List   Diagnosis Date Noted   Pre-op evaluation 12/20/2023   Vitamin D  deficiency disease 05/02/2023   Hair thinning 05/02/2023   Type 2 diabetes mellitus with obesity (HCC) 07/24/2022   Chronic cough 11/16/2021   Colon cancer screening 05/17/2021   Encounter for general adult medical examination with abnormal findings 04/28/2020   Abnormal electrocardiogram (ECG) (EKG) 07/02/2017    Screening for cervical cancer 04/17/2017   Routine general medical examination at a health care facility 10/19/2016   Chronic pain of right ankle 05/07/2016   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 09/21/2014   Gout 05/06/2012   Morbid obesity (HCC) 06/15/2011   Essential hypertension, benign 12/16/2010   Hypercholesterolemia with LDL greater than 190 mg/dL 92/72/7987   Visit for screening mammogram 12/16/2010   Allergic rhinitis 04/16/2010   Past Medical History:  Diagnosis Date   Gout attack    last flare up last week, in toes   Headache    History of hiatal hernia    fixed with weight loss surgery   Hyperlipidemia    Hypertension    Sleep apnea    prior to weight loss surgery   Past Surgical History:  Procedure Laterality Date    c section x 2     ABDOMINAL HYSTERECTOMY     1 ovary removed   BREAST BIOPSY Right 02/23/2020   fibroadenoma   LAPAROSCOPIC GASTRIC SLEEVE RESECTION N/A 09/21/2014   Procedure: LAPAROSCOPIC GASTRIC SLEEVE RESECTION WITH HIATAL HERNIA REPAIR;  Surgeon: Donnice Lunger, MD;  Location: [facility];  Service: General;  Laterality: N/A;   TUBAL LIGATION     Social History   Tobacco Use   Smoking status: Never    Passive exposure: Current   Smokeless tobacco: Never  Vaping Use   Vaping status: Never Used  Substance Use Topics   Alcohol use: Yes    Comment: socially   Drug use: No      Patient Care Team: Lendia Boby CROME, NP-C as PCP - General (Family Medicine) Ginette Shasta NOVAK, NP as Nurse Practitioner (Obstetrics and Gynecology) [facility], [place]   Outpatient Medications Prior to Visit  Medication Sig   allopurinol  (ZYLOPRIM ) 300 MG tablet TAKE 1 TABLET(300 MG) BY MOUTH DAILY   ATHLETES FOOT, CLOTRIMAZOLE , 1 % cream Apply topically as needed.   atorvastatin  (LIPITOR) 20 MG tablet Take 1 tablet (20 mg total) by mouth daily.   clotrimazole -betamethasone  (LOTRISONE ) cream Apply 1 Application topically daily.   colchicine   0.6 MG tablet Take 1 tablet (0.6 mg total) by mouth 2 (two) times daily.   Estradiol  (YUVAFEM ) 10 MCG TABS vaginal tablet Place 1 tablet (10 mcg total) vaginally 2 (two) times a week.   indapamide  (LOZOL ) 1.25 MG tablet TAKE 1 TABLET(1.25 MG) BY MOUTH DAILY   methocarbamol  (ROBAXIN ) 500 MG tablet Take 2 tablets (1,000 mg total) by mouth every 8 (eight) hours as needed for muscle spasms.   Semaglutide -Weight Management (WEGOVY ) 0.5 MG/0.5ML SOAJ Inject 0.5 mg into the skin once a week.   meloxicam  (MOBIC ) 15 MG tablet Take 1 tablet (15 mg total) by mouth daily. (Patient not taking: Reported on 12/20/2023)   Facility-Administered Medications Prior to Visit  Medication Dose Route Frequency Provider   0.9 %  sodium chloride  infusion  500 mL Intravenous Continuous Legrand Victory CROME III, MD    Review of Systems  Constitutional:  Negative for chills, fever and weight loss.  HENT:  Negative for congestion, ear pain, sinus pain and sore throat.   Eyes:  Negative for blurred vision, double vision and pain.  Respiratory:  Negative for cough, shortness of breath and wheezing.   Cardiovascular:  Negative for chest pain, palpitations and leg swelling.  Gastrointestinal:  Negative for abdominal pain, constipation, diarrhea, nausea and vomiting.  Genitourinary:  Negative for dysuria, frequency and urgency.  Musculoskeletal:  Positive for joint pain. Negative for back pain and myalgias.  Skin:  Negative for rash.  Neurological:  Negative for dizziness, tingling, focal weakness and headaches.  Psychiatric/Behavioral:  Negative for depression and suicidal ideas. The patient is not nervous/anxious.        Objective:    BP 126/80   Pulse 65   Temp 97.6 F (36.4 C) (Temporal)   Ht 5' 7 (1.702 m)   Wt 289 lb (131.1 kg)   SpO2 99%   BMI 45.26 kg/m  BP Readings from Last 3 Encounters:  12/20/23 126/80  11/05/23 (!) 154/94  10/22/23 (!) 154/94   Wt Readings from Last 3 Encounters:  12/20/23 289 lb  (131.1 kg)  11/15/23 286 lb (129.7 kg)  11/05/23 286 lb (129.7 kg)    Physical Exam Constitutional:      General: She is not in acute distress.    Appearance: She is obese. She is not ill-appearing.  HENT:     Right Ear: Tympanic membrane, ear canal and external ear normal.     Left Ear: Tympanic membrane, ear canal and external ear normal.     Nose: Nose normal.     Mouth/Throat:     Mouth: Mucous membranes are moist.     Pharynx: Oropharynx is clear.  Eyes:     Extraocular Movements: Extraocular movements intact.     Conjunctiva/sclera: Conjunctivae normal.     Pupils: Pupils are equal, round, and reactive to light.  Neck:     Thyroid : No thyroid  mass, thyromegaly or thyroid  tenderness.  Cardiovascular:     Rate and Rhythm: Normal rate and regular rhythm.     Pulses: Normal pulses.     Heart sounds: Normal heart sounds.  Pulmonary:     Effort: Pulmonary effort is normal.     Breath sounds: Normal breath sounds.  Abdominal:     General: Bowel sounds are normal.     Palpations: Abdomen is soft.     Tenderness: There is no abdominal tenderness. There is no right CVA tenderness, left CVA tenderness, guarding or rebound.  Musculoskeletal:        General: Normal range of motion.     Cervical back: Normal range of motion and neck supple. No tenderness.     Right lower leg: No edema.     Left lower leg: No edema.     Comments: Brace on right ankle   Lymphadenopathy:     Cervical: No cervical adenopathy.  Skin:    General: Skin is warm and dry.     Findings: No lesion or rash.  Neurological:     General: No focal deficit present.     Mental Status: She is alert and oriented to person, place, and time.     Cranial Nerves: No cranial nerve deficit.     Sensory: No sensory deficit.     Motor: No weakness.  Gait: Gait normal.  Psychiatric:        Mood and Affect: Mood normal.        Behavior: Behavior normal.        Thought Content: Thought content normal.       Results for orders placed or performed in visit on 12/20/23  ECHOCARDIOGRAM COMPLETE  Result Value Ref Range   Weight 4,624 oz   Height 67 in   BP 126/80 mmHg   Area-P 1/2 3.77 cm2   S' Lateral 3.03 cm   AR max vel 1.66 cm2   AV Area VTI 1.72 cm2   AV Area mean vel 1.54 cm2   AV Mean grad 4.5 mmHg   AV Peak grad 7.8 mmHg   Ao pk vel 1.40 m/s   Est EF 65 - 70%       Assessment & Plan:    Routine Health Maintenance and Physical Exam  Problem List Items Addressed This Visit     Abnormal electrocardiogram (ECG) (EKG)   Echocardiogram and CT coronary scan ordered.  EKG findings include NSR, rate 62, T wave inversion in leads I and II that are new. T wave abnormalities present in all inferior and lateral leads. Asymptomatic  Needs surgery clearance      Relevant Orders   EKG 12-Lead (Completed)   Chronic pain of right ankle   Encounter for general adult medical examination with abnormal findings - Primary   Preventive health care reviewed.  Counseling on healthy lifestyle including diet and exercise.  Recommend regular dental and eye exams.  Immunizations reviewed.  Discussed safety. Continue getting annual mammograms, seeing her OB/GYN and colon cancer screening as recommended.  We discussed shingles vaccine and pneumonia vaccine.      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   Type 2 diabetes mellitus with obesity (HCC)   Relevant Orders   CBC with Differential/Platelet   Comprehensive metabolic panel with GFR    Hemoglobin A1c   TSH   Vitamin D  deficiency disease   Relevant Orders   VITAMIN D  25 Hydroxy (Vit-D Deficiency, Fractures)   Other Visit Diagnoses       Abnormal electrocardiogram       Relevant Orders   ECHOCARDIOGRAM COMPLETE (Completed)   CT CORONARY MORPH W/CTA COR W/SCORE W/CA W/CM &/OR WO/CM   EKG 12-Lead (Completed)       Assessment and Plan Assessment & Plan  Preventive health maintenance - Up to date with dental examinations - Upcoming gynecological oncology appointment - Monitored for breast spots and due for mammogram in October - Eye exam completed in February and follow-up   Right ankle tendon and ligament tear with planned surgery Right ankle tendon and ligament tear confirmed by MRI and x-ray, requiring surgical intervention. Previous physical therapy did not improve the condition. Dr. Kit will perform the surgery, which involves moving a tendon and inserting a screw. Anticipated recovery includes six weeks of downtime. - Proceed with planned right ankle surgery in September -  Emphasize the importance of post-surgical physical therapy for recovery  Type 2 diabetes mellitus Type 2 diabetes mellitus is currently managed with semaglutide  (Wegovy ) for weight management and blood sugar control. Hemoglobin A1c was 6.1% in December 2024, indicating good control. She has not been on other diabetes medications. - Continue semaglutide  (Wegovy ) 0.25 mg subcutaneous weekly, using the abdomen for injections for better absorption - Monitor blood sugar levels - Check hemoglobin A1c during current visit  Morbid obesity Morbid obesity is being managed with semaglutide  (Wegovy ) for weight loss. She reports a weight gain of three pounds, possibly due to reduced mobility. The current dose of semaglutide  has been maintained since March. - Continue semaglutide  (Wegovy ) 0.25 mg subcutaneous weekly, using the abdomen for injections for better absorption - Advise on dietary  modifications, including increased intake of salads and fruits  Hypercholesterolemia with LDL greater than 190 mg/dL Hypercholesterolemia is being managed with atorvastatin . She reports improved adherence to the medication regimen. Previous LDL levels were higher than 190 mg/dL. - Continue atorvastatin  20 mg oral daily - Check cholesterol levels during current visit  Gout Gout is managed with allopurinol  daily and colchicine  as needed for flare-ups. She reports no recent gout flare-ups, with the last occurrence being months ago. - Continue allopurinol  300 mg oral daily - Use colchicine  0.6 mg oral as needed for gout flare-ups  Vitamin D  deficiency Vitamin D  deficiency was noted with a level of 24 in December 2024. She is not currently taking a vitamin D  supplement. Vitamin D  is important for calcium  utilization and bone health. - Check vitamin D  levels during current visit - Consider vitamin D  supplementation based on current levels   Return in about 4 months (around 04/20/2024) for chronic health conditions.     Boby Mackintosh, NP-C

## 2023-12-20 NOTE — Assessment & Plan Note (Signed)
 Echocardiogram and CT coronary scan ordered.  EKG findings include NSR, rate 62, T wave inversion in leads I and II that are new. T wave abnormalities present in all inferior and lateral leads. Asymptomatic  Needs surgery clearance

## 2023-12-20 NOTE — Assessment & Plan Note (Signed)
 Preventive health care reviewed.  Counseling on healthy lifestyle including diet and exercise.  Recommend regular dental and eye exams.  Immunizations reviewed.  Discussed safety. Continue getting annual mammograms, seeing her OB/GYN and colon cancer screening as recommended.  We discussed shingles vaccine and pneumonia vaccine.

## 2023-12-20 NOTE — Patient Instructions (Addendum)
 Please check on the Shingrix  vaccines and pneumonia vaccines with your insurance.   You can get these at your pharmacy or schedule a nurse visit here.  I do not recommend getting any vaccines within 2 weeks of your surgery.  I have ordered an echocardiogram and a CT coronary calcium  scan.  You will be contacted to get these done.  This is to evaluate your heart due to your abnormal EKG.

## 2023-12-21 ENCOUNTER — Telehealth (HOSPITAL_COMMUNITY): Payer: Self-pay | Admitting: Emergency Medicine

## 2023-12-21 ENCOUNTER — Ambulatory Visit: Payer: Self-pay | Admitting: Family Medicine

## 2023-12-21 ENCOUNTER — Encounter: Payer: Self-pay | Admitting: Family Medicine

## 2023-12-21 ENCOUNTER — Encounter (HOSPITAL_COMMUNITY): Payer: Self-pay

## 2023-12-21 LAB — CBC WITH DIFFERENTIAL/PLATELET
Basophils Absolute: 0.1 x10E3/uL (ref 0.0–0.2)
Basos: 1 %
EOS (ABSOLUTE): 0.1 x10E3/uL (ref 0.0–0.4)
Eos: 1 %
Hematocrit: 42 % (ref 34.0–46.6)
Hemoglobin: 13.8 g/dL (ref 11.1–15.9)
Immature Grans (Abs): 0 x10E3/uL (ref 0.0–0.1)
Immature Granulocytes: 0 %
Lymphocytes Absolute: 2.5 x10E3/uL (ref 0.7–3.1)
Lymphs: 33 %
MCH: 27.8 pg (ref 26.6–33.0)
MCHC: 32.9 g/dL (ref 31.5–35.7)
MCV: 85 fL (ref 79–97)
Monocytes Absolute: 0.7 x10E3/uL (ref 0.1–0.9)
Monocytes: 9 %
Neutrophils Absolute: 4.2 x10E3/uL (ref 1.4–7.0)
Neutrophils: 56 %
Platelets: 258 x10E3/uL (ref 150–450)
RBC: 4.97 x10E6/uL (ref 3.77–5.28)
RDW: 13.8 % (ref 11.7–15.4)
WBC: 7.5 x10E3/uL (ref 3.4–10.8)

## 2023-12-21 LAB — TSH: TSH: 1.25 u[IU]/mL (ref 0.450–4.500)

## 2023-12-21 LAB — HEMOGLOBIN A1C
Est. average glucose Bld gHb Est-mCnc: 128 mg/dL
Hgb A1c MFr Bld: 6.1 % — ABNORMAL HIGH (ref 4.8–5.6)

## 2023-12-21 LAB — URIC ACID: Uric Acid: 7.7 mg/dL — ABNORMAL HIGH (ref 3.0–7.2)

## 2023-12-21 LAB — LIPID PANEL
Chol/HDL Ratio: 4.4 ratio (ref 0.0–4.4)
Cholesterol, Total: 271 mg/dL — ABNORMAL HIGH (ref 100–199)
HDL: 62 mg/dL (ref >39)
LDL Chol Calc (NIH): 192 mg/dL — ABNORMAL HIGH (ref 0–99)
Triglycerides: 97 mg/dL (ref 0–149)
VLDL Cholesterol Cal: 17 mg/dL (ref 5–40)

## 2023-12-21 LAB — COMPREHENSIVE METABOLIC PANEL WITH GFR
ALT: 15 IU/L (ref 0–32)
AST: 19 IU/L (ref 0–40)
Albumin: 4.3 g/dL (ref 3.8–4.9)
Alkaline Phosphatase: 85 IU/L (ref 44–121)
BUN/Creatinine Ratio: 13 (ref 9–23)
BUN: 11 mg/dL (ref 6–24)
Bilirubin Total: 0.6 mg/dL (ref 0.0–1.2)
CO2: 22 mmol/L (ref 20–29)
Calcium: 9.4 mg/dL (ref 8.7–10.2)
Chloride: 97 mmol/L (ref 96–106)
Creatinine, Ser: 0.87 mg/dL (ref 0.57–1.00)
Globulin, Total: 2.9 g/dL (ref 1.5–4.5)
Glucose: 85 mg/dL (ref 70–99)
Potassium: 3.8 mmol/L (ref 3.5–5.2)
Sodium: 138 mmol/L (ref 134–144)
Total Protein: 7.2 g/dL (ref 6.0–8.5)
eGFR: 80 mL/min/1.73 (ref >59)

## 2023-12-21 LAB — VITAMIN D 25 HYDROXY (VIT D DEFICIENCY, FRACTURES): Vit D, 25-Hydroxy: 24.7 ng/mL — ABNORMAL LOW (ref 30.0–100.0)

## 2023-12-21 NOTE — Progress Notes (Signed)
 Please reach out to see how often she is taking her cholesterol medication and her gout medication. Her labs suggest she is missing doses or not taking them. Her LDL is 192 and increases her risk for heart disease, heart attack and stroke.  Her uric acid is elevated at 7.7 and she is prescribed high dose allopurinol .   Please let me know before I make changes based on these results.

## 2023-12-21 NOTE — Telephone Encounter (Signed)
 Attempted to call patient regarding upcoming cardiac CT appointment. Left message on voicemail with name and callback number Rockwell Alexandria RN Navigator Cardiac [facility] [phone] Office [phone] Cell

## 2023-12-24 ENCOUNTER — Ambulatory Visit (HOSPITAL_COMMUNITY)
Admission: RE | Admit: 2023-12-24 | Discharge: 2023-12-24 | Disposition: A | Discharge status: Home / Self Care | Source: Ambulatory Visit | Attending: Family Medicine | Admitting: Family Medicine

## 2023-12-24 VITALS — BP 154/81 | HR 81

## 2023-12-24 DIAGNOSIS — R9431 Abnormal electrocardiogram [ECG] [EKG]: Secondary | ICD-10-CM | POA: Insufficient documentation

## 2023-12-24 DIAGNOSIS — Z01810 Encounter for preprocedural cardiovascular examination: Secondary | ICD-10-CM | POA: Diagnosis not present

## 2023-12-24 DIAGNOSIS — R072 Precordial pain: Secondary | ICD-10-CM | POA: Insufficient documentation

## 2023-12-24 DIAGNOSIS — R931 Abnormal findings on diagnostic imaging of heart and coronary circulation: Secondary | ICD-10-CM

## 2023-12-24 DIAGNOSIS — I517 Cardiomegaly: Secondary | ICD-10-CM | POA: Insufficient documentation

## 2023-12-24 DIAGNOSIS — I2510 Atherosclerotic heart disease of native coronary artery without angina pectoris: Secondary | ICD-10-CM

## 2023-12-24 DIAGNOSIS — Z01818 Encounter for other preprocedural examination: Secondary | ICD-10-CM

## 2023-12-24 MED ORDER — IOHEXOL 350 MG/ML SOLN
100.0000 mL | Freq: Once | INTRAVENOUS | Status: AC | PRN
  Administered 2023-12-24: 100 mL via INTRAVENOUS

## 2023-12-24 MED ORDER — NITROGLYCERIN 0.4 MG SL SUBL
0.8000 mg | SUBLINGUAL_TABLET | Freq: Once | SUBLINGUAL | Status: AC
  Administered 2023-12-24: 0.8 mg via SUBLINGUAL

## 2023-12-25 ENCOUNTER — Other Ambulatory Visit (HOSPITAL_COMMUNITY)
Admission: RE | Admit: 2023-12-25 | Discharge: 2023-12-25 | Disposition: A | Discharge status: Home / Self Care | Source: Ambulatory Visit | Attending: Radiology | Admitting: Radiology

## 2023-12-25 ENCOUNTER — Encounter: Payer: Self-pay | Admitting: Radiology

## 2023-12-25 ENCOUNTER — Ambulatory Visit: Payer: Self-pay | Admitting: Family Medicine

## 2023-12-25 ENCOUNTER — Ambulatory Visit (INDEPENDENT_AMBULATORY_CARE_PROVIDER_SITE_OTHER): Payer: Managed Care, Other (non HMO) | Admitting: Radiology

## 2023-12-25 ENCOUNTER — Ambulatory Visit (HOSPITAL_COMMUNITY)
Admission: RE | Admit: 2023-12-25 | Discharge: 2023-12-25 | Disposition: A | Discharge status: Home / Self Care | Source: Ambulatory Visit | Attending: Cardiology | Admitting: Cardiology

## 2023-12-25 VITALS — BP 140/86 | HR 80 | Ht 68.25 in | Wt 287.0 lb

## 2023-12-25 DIAGNOSIS — R9431 Abnormal electrocardiogram [ECG] [EKG]: Secondary | ICD-10-CM | POA: Diagnosis not present

## 2023-12-25 DIAGNOSIS — R931 Abnormal findings on diagnostic imaging of heart and coronary circulation: Secondary | ICD-10-CM

## 2023-12-25 DIAGNOSIS — Z1331 Encounter for screening for depression: Secondary | ICD-10-CM

## 2023-12-25 DIAGNOSIS — Z01419 Encounter for gynecological examination (general) (routine) without abnormal findings: Secondary | ICD-10-CM

## 2023-12-25 DIAGNOSIS — N952 Postmenopausal atrophic vaginitis: Secondary | ICD-10-CM

## 2023-12-25 MED ORDER — ESTRADIOL 10 MCG VA TABS: 1.0000 | ORAL_TABLET | VAGINAL | 11 refills | Status: AC

## 2023-12-25 NOTE — Patient Instructions (Signed)
 Preventive Care 16-53 Years Old, Female  Preventive care refers to lifestyle choices and visits with your health care provider that can promote health and wellness. Preventive care visits are also called wellness exams.  What can I expect for my preventive care visit?  Counseling  Your health care provider may ask you questions about your:  Medical history, including:  Past medical problems.  Family medical history.  Pregnancy history.  Current health, including:  Menstrual cycle.  Method of birth control.  Emotional well-being.  Home life and relationship well-being.  Sexual activity and sexual health.  Lifestyle, including:  Alcohol, nicotine or tobacco, and drug use.  Access to firearms.  Diet, exercise, and sleep habits.  Work and work Astronomer.  Sunscreen use.  Safety issues such as seatbelt and bike helmet use.  Physical exam  Your health care provider will check your:  Height and weight. These may be used to calculate your BMI (body mass index). BMI is a measurement that tells if you are at a healthy weight.  Waist circumference. This measures the distance around your waistline. This measurement also tells if you are at a healthy weight and may help predict your risk of certain diseases, such as type 2 diabetes and high blood pressure.  Heart rate and blood pressure.  Body temperature.  Skin for abnormal spots.  What immunizations do I need?    Vaccines are usually given at various ages, according to a schedule. Your health care provider will recommend vaccines for you based on your age, medical history, and lifestyle or other factors, such as travel or where you work.  What tests do I need?  Screening  Your health care provider may recommend screening tests for certain conditions. This may include:  Lipid and cholesterol levels.  Diabetes screening. This is done by checking your blood sugar (glucose) after you have not eaten for a while (fasting).  Pelvic exam and Pap test.  Hepatitis B test.  Hepatitis C  test.  HIV (human immunodeficiency virus) test.  STI (sexually transmitted infection) testing, if you are at risk.  Lung cancer screening.  Colorectal cancer screening.  Mammogram. Talk with your health care provider about when you should start having regular mammograms. This may depend on whether you have a family history of breast cancer.  BRCA-related cancer screening. This may be done if you have a family history of breast, ovarian, tubal, or peritoneal cancers.  Bone density scan. This is done to screen for osteoporosis.  Talk with your health care provider about your test results, treatment options, and if necessary, the need for more tests.  Follow these instructions at home:  Eating and drinking    Eat a diet that includes fresh fruits and vegetables, whole grains, lean protein, and low-fat dairy products.  Take vitamin and mineral supplements as recommended by your health care provider.  Do not drink alcohol if:  Your health care provider tells you not to drink.  You are pregnant, may be pregnant, or are planning to become pregnant.  If you drink alcohol:  Limit how much you have to 0-1 drink a day.  Know how much alcohol is in your drink. In the [place] one drink equals one 12 oz bottle of beer (355 mL), one 5 oz glass of wine (148 mL), or one 1 oz glass of hard liquor (44 mL).  Lifestyle  Brush your teeth every morning and night with fluoride toothpaste. Floss one time each day.  Exercise for at least  30 minutes 5 or more days each week.  Do not use any products that contain nicotine or tobacco. These products include cigarettes, chewing tobacco, and vaping devices, such as e-cigarettes. If you need help quitting, ask your health care provider.  Do not use drugs.  If you are sexually active, practice safe sex. Use a condom or other form of protection to prevent STIs.  If you do not wish to become pregnant, use a form of birth control. If you plan to become pregnant, see your health care provider for a  prepregnancy visit.  Take aspirin only as told by your health care provider. Make sure that you understand how much to take and what form to take. Work with your health care provider to find out whether it is safe and beneficial for you to take aspirin daily.  Find healthy ways to manage stress, such as:  Meditation, yoga, or listening to music.  Journaling.  Talking to a trusted person.  Spending time with friends and family.  Minimize exposure to UV radiation to reduce your risk of skin cancer.  Safety  Always wear your seat belt while driving or riding in a vehicle.  Do not drive:  If you have been drinking alcohol. Do not ride with someone who has been drinking.  When you are tired or distracted.  While texting.  If you have been using any mind-altering substances or drugs.  Wear a helmet and other protective equipment during sports activities.  If you have firearms in your house, make sure you follow all gun safety procedures.  Seek help if you have been physically or sexually abused.  What's next?  Visit your health care provider once a year for an annual wellness visit.  Ask your health care provider how often you should have your eyes and teeth checked.  Stay up to date on all vaccines.  This information is not intended to replace advice given to you by your health care provider. Make sure you discuss any questions you have with your health care provider.  Document Revised: 11/03/2020 Document Reviewed: 11/03/2020  Elsevier Patient Education  2024 [employer].
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
## 2023-12-26 ENCOUNTER — Other Ambulatory Visit: Payer: Self-pay | Admitting: Family Medicine

## 2023-12-26 DIAGNOSIS — Z01818 Encounter for other preprocedural examination: Secondary | ICD-10-CM

## 2023-12-26 DIAGNOSIS — I358 Other nonrheumatic aortic valve disorders: Secondary | ICD-10-CM | POA: Insufficient documentation

## 2023-12-26 DIAGNOSIS — I2510 Atherosclerotic heart disease of native coronary artery without angina pectoris: Secondary | ICD-10-CM | POA: Insufficient documentation

## 2023-12-26 DIAGNOSIS — I5189 Other ill-defined heart diseases: Secondary | ICD-10-CM | POA: Insufficient documentation

## 2023-12-26 DIAGNOSIS — E7800 Pure hypercholesterolemia, unspecified: Secondary | ICD-10-CM

## 2023-12-27 ENCOUNTER — Telehealth: Payer: Self-pay

## 2023-12-27 NOTE — Telephone Encounter (Signed)
 Called patient to schedule her a new patient appointment. Left a detailed message for her to call us  back.

## 2023-12-27 NOTE — Telephone Encounter (Signed)
 Left patient a detailed message for the patient to call us  back to schedule a new patient appointment.

## 2023-12-28 ENCOUNTER — Telehealth: Payer: Self-pay

## 2023-12-28 NOTE — Telephone Encounter (Signed)
 Copied from CRM #[id number]. Topic: Referral - Question >> Dec 28, 2023  9:18 AM Suzen RAMAN wrote: Reason for CRM: [facility] received a referral for pre op clearance but needs clarification on what type of procedure patient is having.    CB#[phone]

## 2023-12-28 NOTE — Telephone Encounter (Signed)
 Spoke to patient and she is scheduled to see Dr. Court on 01/02/24 to establish care and to have pre-op clearance done. Pre-op APP made aware.

## 2023-12-28 NOTE — Progress Notes (Signed)
 Please let [facility] know that patient is having ankle surgery.

## 2023-12-31 ENCOUNTER — Telehealth: Payer: Self-pay

## 2023-12-31 NOTE — Telephone Encounter (Signed)
 Pt is not having surgery with our office.   I contacted pt and let her know to contact heart care to let them know where they needed to send the paperwork.

## 2023-12-31 NOTE — Telephone Encounter (Signed)
 Called [facility] and left a message for preop clearance request information  Pt is scheduled for IN OFFICE New Pt appt 01/02/24 with Dorn Lesches, MD

## 2023-12-31 NOTE — Telephone Encounter (Signed)
-----   Message from [facility] M sent at 12/27/2023  8:57 AM EDT ----- Regarding: FW: New Patient Preop Will you call this patient to get information for preop clearance request.   Thank you   Lucie Ku ----- Message ----- From: Edsel Rosina POUR Sent: 12/27/2023   8:52 AM EDT To: Niels CHRISTELLA Jest, CMA Subject: New Patient Preop                              Good morning,  This patient is calling because she would like to schedule a new patient preop appointment. The referral has been placed in the patient's chart. Patient is requesting for us  to return her call at [phone].  Thanks, Rosina Edsel

## 2023-12-31 NOTE — Telephone Encounter (Signed)
 Called pt and she states she does not see [facility] and they got pre op paperwork, [facility] is going to do the surgery and does not have any paperwork for her. Vickie wanted to make sure pt was cleared for surgery on her part so l advised pt l will contact cardiology and notify them.

## 2023-12-31 NOTE — Telephone Encounter (Signed)
 Copied from CRM [id number]. Topic: General - Other >> Dec 31, 2023  3:53 PM Macario HERO wrote: Reason for CRM: Patient stated she is not going to [facility] and her surgery is with Dr. Norleen Armor at [facility]. Patient is requesting a call back from providers nurse.

## 2024-01-01 ENCOUNTER — Telehealth: Payer: Self-pay | Admitting: Cardiovascular Disease

## 2024-01-01 ENCOUNTER — Ambulatory Visit: Payer: Self-pay | Admitting: Radiology

## 2024-01-01 LAB — CYTOLOGY - PAP
Comment: NEGATIVE
Diagnosis: REACTIVE
High risk HPV: NEGATIVE

## 2024-01-01 NOTE — Telephone Encounter (Signed)
   Name: Dejae Bernet  DOB: Jan 01, 1971  MRN: [id number]  Primary Cardiologist: None  Chart reviewed as part of pre-operative protocol coverage. The patient has an upcoming visit scheduled with Dr. Court on 01/02/24 at which time clearance can be addressed in case there are any issues that would impact surgical recommendations.  Procedure is not scheduled as below. I added preop FYI to appointment note so that provider is aware to address at time of outpatient visit.  Per office protocol the cardiology provider should forward their finalized clearance decision and recommendations regarding antiplatelet therapy to the requesting party below.    I will route this message as FYI to requesting party and remove this message from the preop box as separate preop APP input not needed at this time.   Please call with any questions.  Mckyla Deckman D Dailee Manalang, NP  01/01/2024, 3:31 PM

## 2024-01-01 NOTE — Telephone Encounter (Signed)
 Called and was able to speak w cardiology advising them that her visit w them is bc vickie wanted their opinion on her upcoming surgery. I told them that pt is actually having surgery w [facility] which is already scheduled and not [facility], they wanted to know which provider at [facility] and when this was. I stated l do not know when the surgery is but they informed me that in order to be cleared by them they require paperwork, regardless if pt reported she does not have any clearance forms and states none are needed. They requested our office fax number and phone number for any follow up to which l was able to provide

## 2024-01-01 NOTE — Telephone Encounter (Signed)
   Pre-operative Risk Assessment    Patient Name: Janice Huang  DOB: Feb 24, 1971 MRN: [id number]   Date of last office visit: N/A Date of next office visit: 01/02/24   Request for Surgical Clearance    Procedure:  Right Ankle  Date of Surgery:  Clearance TBD                                Surgeon:  Dr. Norleen Armor Surgeon's Group or Practice Name:  [facility] Phone number:   Fax number: [phone] ([facility])    Type of Clearance Requested:   - Medical    Type of Anesthesia:     Additional requests/questions:  Caller Randa) stated [facility] is not requiring cardiac clearance but patient's PCP-Dr. Lendia wants to have cardiac clearance.     Signed, Jasmin B Wilson   01/01/2024, 2:08 PM

## 2024-01-02 ENCOUNTER — Ambulatory Visit: Attending: Cardiovascular Disease | Admitting: Cardiovascular Disease

## 2024-01-02 ENCOUNTER — Encounter: Payer: Self-pay | Admitting: Cardiovascular Disease

## 2024-01-02 ENCOUNTER — Telehealth: Payer: Self-pay

## 2024-01-02 VITALS — BP 142/88 | HR 75 | Ht 68.25 in | Wt 290.0 lb

## 2024-01-02 DIAGNOSIS — I10 Essential (primary) hypertension: Secondary | ICD-10-CM | POA: Diagnosis not present

## 2024-01-02 DIAGNOSIS — Z01818 Encounter for other preprocedural examination: Secondary | ICD-10-CM

## 2024-01-02 DIAGNOSIS — E7800 Pure hypercholesterolemia, unspecified: Secondary | ICD-10-CM | POA: Diagnosis not present

## 2024-01-02 DIAGNOSIS — I2510 Atherosclerotic heart disease of native coronary artery without angina pectoris: Secondary | ICD-10-CM
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
## 2024-01-02 NOTE — Patient Instructions (Signed)
 Medication Instructions:  Your physician recommends that you continue on your current medications as directed. Please refer to the Current Medication list given to you today.  *If you need a refill on your cardiac medications before your next appointment, please call your pharmacy*  Follow-Up: At [facility], you and your health needs are our priority.  As part of our continuing mission to provide you with exceptional heart care, our providers are all part of one team.  This team includes your primary Cardiologist (physician) and Advanced Practice Providers or APPs (Physician Assistants and Nurse Practitioners) who all work together to provide you with the care you need, when you need it.  Your next appointment:   12 month(s)  Provider:   Lauro Portal, MD    We recommend signing up for the patient portal called "MyChart".  Sign up information is provided on this After Visit Summary.  MyChart is used to connect with patients for Virtual Visits (Telemedicine).  Patients are able to view lab/test results, encounter notes, upcoming appointments, etc.  Non-urgent messages can be sent to your provider as well.   To learn more about what you can do with MyChart, go to [email or web address].   Other Instructions Heart-Healthy Eating Plan Eating a healthy diet is important for the health of your heart. A heart-healthy eating plan includes: Eating less unhealthy fats. Eating more healthy fats. Eating less salt in your food. Salt is also called sodium. Making other changes in your diet. Cooking Avoid frying your food. Try to bake, boil, grill, or broil it instead. You can also reduce fat by: Removing the skin from poultry. Removing all visible fats from meats. Steaming vegetables in water or broth. Meal planning  At meals, divide your plate into four equal parts: Fill one-half of your plate with vegetables and green salads. Fill one-fourth of your plate with whole grains. Fill  one-fourth of your plate with lean protein foods. Eat 2-4 cups of vegetables per day. One cup of vegetables is: 1 cup (91 g) broccoli or cauliflower florets. 2 medium carrots. 1 large bell pepper. 1 large sweet potato. 1 large tomato. 1 medium white potato. 2 cups (150 g) raw leafy greens. Eat 1-2 cups of fruit per day. One cup of fruit is: 1 small apple 1 large banana 1 cup (237 g) mixed fruit, 1 large orange,  cup (82 g) dried fruit, 1 cup (240 mL) 100% fruit juice. Eat more foods that have soluble fiber. These are apples, broccoli, carrots, beans, peas, and barley. Try to get 20-30 g of fiber per day. Eat 4-5 servings of nuts, legumes, and seeds per week: 1 serving of dried beans or legumes equals  cup (90 g) cooked. 1 serving of nuts is  oz (12 almonds, 24 pistachios, or 7 walnut halves). 1 serving of seeds equals  oz (8 g). General information Eat more home-cooked food. Eat less restaurant, buffet, and fast food. Limit or avoid alcohol. Limit foods that are high in starch and sugar. Avoid fried foods. Lose weight if you are overweight. Keep track of how much salt (sodium) you eat. This is important if you have high blood pressure. Ask your doctor to tell you more about this. Try to add vegetarian meals each week. Fats Choose healthy fats. These include olive oil and canola oil, flaxseeds, walnuts, almonds, and seeds. Eat more omega-3 fats. These include salmon, mackerel, sardines, tuna, flaxseed oil, and ground flaxseeds. Try to eat fish at least 2 times each week. Check food  labels. Avoid foods with trans fats or high amounts of saturated fat. Limit saturated fats. These are often found in animal products, such as meats, butter, and cream. These are also found in plant foods, such as palm oil, palm kernel oil, and coconut oil. Avoid foods with partially hydrogenated oils in them. These have trans fats. Examples are stick margarine, some tub margarines, cookies,  crackers, and other baked goods. What foods should I eat? Fruits All fresh, canned (in natural juice), or frozen fruits. Vegetables Fresh or frozen vegetables (raw, steamed, roasted, or grilled). Green salads. Grains Most grains. Choose whole wheat and whole grains most of the time. Rice and pasta, including brown rice and pastas made with whole wheat. Meats and other proteins Lean, well-trimmed beef, veal, pork, and lamb. Chicken and [place] without skin. All fish and shellfish. Wild duck, rabbit, pheasant, and venison. Egg whites or low-cholesterol egg substitutes. Dried beans, peas, lentils, and tofu. Seeds and most nuts. Dairy Low-fat or nonfat cheeses, including ricotta and mozzarella. Skim or 1% milk that is liquid, powdered, or evaporated. Buttermilk that is made with low-fat milk. Nonfat or low-fat yogurt. Fats and oils Non-hydrogenated (trans-free) margarines. Vegetable oils, including soybean, sesame, sunflower, olive, peanut, safflower, corn, canola, and cottonseed. Salad dressings or mayonnaise made with a vegetable oil. Beverages Mineral water. Coffee and tea. Diet carbonated beverages. Sweets and desserts Sherbet, gelatin, and fruit ice. Small amounts of dark chocolate. Limit all sweets and desserts. Seasonings and condiments All seasonings and condiments. The items listed above may not be a complete list of foods and drinks you can eat. Contact a dietitian for more options. What foods should I avoid? Fruits Canned fruit in heavy syrup. Fruit in cream or butter sauce. Fried fruit. Limit coconut. Vegetables Vegetables cooked in cheese, cream, or butter sauce. Fried vegetables. Grains Breads that are made with saturated or trans fats, oils, or whole milk. Croissants. Sweet rolls. Donuts. High-fat crackers, such as cheese crackers. Meats and other proteins Fatty meats, such as hot dogs, ribs, sausage, bacon, rib-eye roast or steak. High-fat deli meats, such as salami and  bologna. Caviar. Domestic duck and goose. Organ meats, such as liver. Dairy Cream, sour cream, cream cheese, and creamed cottage cheese. Whole-milk cheeses. Whole or 2% milk that is liquid, evaporated, or condensed. Whole buttermilk. Cream sauce or high-fat cheese sauce. Yogurt that is made from whole milk. Fats and oils Meat fat, or shortening. Cocoa butter, hydrogenated oils, palm oil, coconut oil, palm kernel oil. Solid fats and shortenings, including bacon fat, salt pork, lard, and butter. Nondairy cream substitutes. Salad dressings with cheese or sour cream. Beverages Regular sodas and juice drinks with added sugar. Sweets and desserts Frosting. Pudding. Cookies. Cakes. Pies. Milk chocolate or white chocolate. Buttered syrups. Full-fat ice cream or ice cream drinks. The items listed above may not be a complete list of foods and drinks to avoid. Contact a dietitian for more information. Summary Heart-healthy meal planning includes eating less unhealthy fats, eating more healthy fats, and making other changes in your diet. Eat a balanced diet. This includes fruits and vegetables, low-fat or nonfat dairy, lean protein, nuts and legumes, whole grains, and heart-healthy oils and fats. This information is not intended to replace advice given to you by your health care provider. Make sure you discuss any questions you have with your health care provider. Document Revised: 06/13/2021 Document Reviewed: 06/13/2021 Elsevier Patient Education  2024 [employer].

## 2024-01-02 NOTE — Assessment & Plan Note (Signed)
 History of hyperlipidemia recently started on atorvastatin  which she takes sporadically.  Her most recent lipid profile performed 12/20/2023 revealed total cholesterol 271, LDL of 192 and HDL of 62.  Given her recent coronary CTA findings her LDL goal should be less than 70.  This is being followed by her PCP.

## 2024-01-02 NOTE — Assessment & Plan Note (Signed)
On Wegovy ?

## 2024-01-02 NOTE — Assessment & Plan Note (Signed)
 History of essential hypertension with blood pressure measured today 142/88.  She is on indapamide .

## 2024-01-02 NOTE — Telephone Encounter (Signed)
   Pre-operative Risk Assessment    Patient Name: Janice Huang  DOB: 1970-07-02 MRN: [id number]   Date of last office visit: 01/02/24 DORN LESCHES, MD Date of next office visit: NONE   Request for Surgical Clearance    Procedure:  RIGHT GASTROC RECESSION; MEDIALING CALCANEUS OSTEOTOMY; POSTERIOR TIBIAL TENDON TENOLYSIS; FLEXOR DIGITORUM TRANSFER TOT HE NAVICULAR; 1ST TARSOMETATARSAL ARTHRODESIS  Date of Surgery:  Clearance 02/19/24                                Surgeon:  DR NORLEEN HEWITT Surgeon's Group or Practice Name:  [facility] Phone number:  [phone] Fax number:  [phone]  ATTN: MEGAN DAVIS   Type of Clearance Requested:   - Medical    Type of Anesthesia:  General    Additional requests/questions:    SignedLucie DELENA Ku   01/02/2024, 2:34 PM

## 2024-01-02 NOTE — Assessment & Plan Note (Signed)
 Recent coronary CTA showed a coronary calcium  score of 0 with moderate disease in the mid LAD that was negative by FFR.  The patient is asymptomatic.  Based on this with opted for aggressive risk factor modification.  The patient was just started on a statin drug which she takes intermittently.  LDL goal less than 70.

## 2024-01-02 NOTE — Assessment & Plan Note (Signed)
 Patient needs right ankle surgery.  Her 2D echo was normal and coronary CTA showed nonobstructive disease.  Patient is cleared with at low risk.

## 2024-02-01 ENCOUNTER — Other Ambulatory Visit: Payer: Self-pay | Admitting: Family Medicine

## 2024-02-01 DIAGNOSIS — I10 Essential (primary) hypertension: Secondary | ICD-10-CM

## 2024-02-16 ENCOUNTER — Other Ambulatory Visit: Payer: Self-pay | Admitting: Family Medicine

## 2024-02-16 DIAGNOSIS — E7800 Pure hypercholesterolemia, unspecified: Secondary | ICD-10-CM

## 2024-02-28 ENCOUNTER — Telehealth: Payer: Self-pay | Admitting: Family Medicine

## 2024-02-28 DIAGNOSIS — M170 Bilateral primary osteoarthritis of knee: Secondary | ICD-10-CM

## 2024-02-28 DIAGNOSIS — G8929 Other chronic pain: Secondary | ICD-10-CM

## 2024-02-28 NOTE — Telephone Encounter (Signed)
 Patient called asking if information in regards to her osteoarthritis in her knees could be sent to Dr Kit at [facility]. She said that they are needing when she was fist diagnosed and when her last injections were given.  Please advise.

## 2024-02-29 NOTE — Telephone Encounter (Signed)
 Referral placed. Visit notes faxed.

## 2024-02-29 NOTE — Addendum Note (Signed)
 Addended by: MARDY LEOTIS RAMAN on: 02/29/2024 09:54 AM   Modules accepted: Orders

## 2024-03-24 ENCOUNTER — Encounter: Payer: Self-pay | Admitting: Radiology

## 2024-04-07 ENCOUNTER — Encounter

## 2024-04-14 ENCOUNTER — Telehealth: Payer: Self-pay | Admitting: Family Medicine

## 2024-04-14 NOTE — Telephone Encounter (Signed)
 Copied from CRM [id number]. Topic: General - Other >> Apr 14, 2024 11:39 AM Alexandria E wrote: Reason for CRM: Patient has lab orders placed by Boby Mackintosh, and was wanting to get these completed at [facility] before her appointment on 12/11. Called [facility] to double check and was advised that labs had to be done at [facility], but patient stated she usually goes to [facility] anyways and would like this clarified.

## 2024-04-14 NOTE — Telephone Encounter (Signed)
 Called pt to inform lab orders printed and placed up front for pick up

## 2024-04-18 ENCOUNTER — Other Ambulatory Visit: Payer: Self-pay | Admitting: Family Medicine

## 2024-04-19 LAB — CBC WITH DIFFERENTIAL/PLATELET
Basophils Absolute: 0 x10E3/uL (ref 0.0–0.2)
Basos: 0 %
EOS (ABSOLUTE): 0.1 x10E3/uL (ref 0.0–0.4)
Eos: 2 %
Hematocrit: 38.4 % (ref 34.0–46.6)
Hemoglobin: 12.4 g/dL (ref 11.1–15.9)
Immature Grans (Abs): 0 x10E3/uL (ref 0.0–0.1)
Immature Granulocytes: 0 %
Lymphocytes Absolute: 2.4 x10E3/uL (ref 0.7–3.1)
Lymphs: 32 %
MCH: 27.5 pg (ref 26.6–33.0)
MCHC: 32.3 g/dL (ref 31.5–35.7)
MCV: 85 fL (ref 79–97)
Monocytes Absolute: 0.7 x10E3/uL (ref 0.1–0.9)
Monocytes: 9 %
Neutrophils Absolute: 4.3 x10E3/uL (ref 1.4–7.0)
Neutrophils: 57 %
Platelets: 288 x10E3/uL (ref 150–450)
RBC: 4.51 x10E6/uL (ref 3.77–5.28)
RDW: 14.2 % (ref 11.7–15.4)
WBC: 7.6 x10E3/uL (ref 3.4–10.8)

## 2024-04-19 LAB — COMPREHENSIVE METABOLIC PANEL WITH GFR
ALT: 24 IU/L (ref 0–32)
AST: 25 IU/L (ref 0–40)
Albumin: 4.2 g/dL (ref 3.8–4.9)
Alkaline Phosphatase: 84 IU/L (ref 49–135)
BUN/Creatinine Ratio: 15 (ref 9–23)
BUN: 11 mg/dL (ref 6–24)
Bilirubin Total: 0.5 mg/dL (ref 0.0–1.2)
CO2: 26 mmol/L (ref 20–29)
Calcium: 9.3 mg/dL (ref 8.7–10.2)
Chloride: 100 mmol/L (ref 96–106)
Creatinine, Ser: 0.73 mg/dL (ref 0.57–1.00)
Globulin, Total: 2.6 g/dL (ref 1.5–4.5)
Glucose: 117 mg/dL — ABNORMAL HIGH (ref 70–99)
Potassium: 3.7 mmol/L (ref 3.5–5.2)
Sodium: 140 mmol/L (ref 134–144)
Total Protein: 6.8 g/dL (ref 6.0–8.5)
eGFR: 98 mL/min/1.73 (ref >59)

## 2024-04-19 LAB — TSH: TSH: 1.31 u[IU]/mL (ref 0.450–4.500)

## 2024-04-19 LAB — LIPID PANEL
Chol/HDL Ratio: 4.2 ratio (ref 0.0–4.4)
Cholesterol, Total: 216 mg/dL — ABNORMAL HIGH (ref 100–199)
HDL: 51 mg/dL (ref >39)
LDL Chol Calc (NIH): 149 mg/dL — ABNORMAL HIGH (ref 0–99)
Triglycerides: 92 mg/dL (ref 0–149)
VLDL Cholesterol Cal: 16 mg/dL (ref 5–40)

## 2024-04-19 LAB — URIC ACID: Uric Acid: 4.6 mg/dL (ref 3.0–7.2)

## 2024-04-19 LAB — HEMOGLOBIN A1C
Est. average glucose Bld gHb Est-mCnc: 131 mg/dL
Hgb A1c MFr Bld: 6.2 % — ABNORMAL HIGH (ref 4.8–5.6)

## 2024-04-19 LAB — VITAMIN D 25 HYDROXY (VIT D DEFICIENCY, FRACTURES): Vit D, 25-Hydroxy: 22.1 ng/mL — ABNORMAL LOW (ref 30.0–100.0)

## 2024-04-21 ENCOUNTER — Ambulatory Visit
Admission: RE | Admit: 2024-04-21 | Discharge: 2024-04-21 | Disposition: A | Source: Ambulatory Visit | Attending: Family Medicine | Admitting: Family Medicine

## 2024-04-21 DIAGNOSIS — R921 Mammographic calcification found on diagnostic imaging of breast: Secondary | ICD-10-CM

## 2024-04-28 ENCOUNTER — Other Ambulatory Visit: Payer: Self-pay | Admitting: Family

## 2024-04-28 ENCOUNTER — Ambulatory Visit: Payer: Self-pay | Admitting: Family

## 2024-04-28 MED ORDER — VITAMIN D (ERGOCALCIFEROL) 1.25 MG (50000 UNIT) PO CAPS: 50000.0000 [IU] | ORAL_CAPSULE | ORAL | 0 refills | Status: AC

## 2024-04-30 ENCOUNTER — Ambulatory Visit: Admitting: Family Medicine

## 2024-05-01 ENCOUNTER — Encounter: Payer: Self-pay | Admitting: Family Medicine

## 2024-05-01 ENCOUNTER — Ambulatory Visit (INDEPENDENT_AMBULATORY_CARE_PROVIDER_SITE_OTHER): Admitting: Family Medicine

## 2024-05-01 ENCOUNTER — Other Ambulatory Visit: Payer: Self-pay

## 2024-05-01 VITALS — BP 132/86 | HR 80 | Temp 97.8°F | Ht 68.25 in | Wt 302.0 lb

## 2024-05-01 DIAGNOSIS — I2510 Atherosclerotic heart disease of native coronary artery without angina pectoris: Secondary | ICD-10-CM | POA: Diagnosis not present

## 2024-05-01 DIAGNOSIS — E785 Hyperlipidemia, unspecified: Secondary | ICD-10-CM

## 2024-05-01 DIAGNOSIS — E559 Vitamin D deficiency, unspecified: Secondary | ICD-10-CM

## 2024-05-01 DIAGNOSIS — E669 Obesity, unspecified: Secondary | ICD-10-CM

## 2024-05-01 DIAGNOSIS — M1000 Idiopathic gout, unspecified site: Secondary | ICD-10-CM

## 2024-05-01 DIAGNOSIS — I10 Essential (primary) hypertension: Secondary | ICD-10-CM

## 2024-05-01 DIAGNOSIS — E7800 Pure hypercholesterolemia, unspecified: Secondary | ICD-10-CM

## 2024-05-01 MED ORDER — ATORVASTATIN CALCIUM 40 MG PO TABS: 40.0000 mg | ORAL_TABLET | Freq: Every day | ORAL | 3 refills | Status: AC

## 2024-05-01 NOTE — Progress Notes (Signed)
 Subjective:     Patient ID: Janice Huang, female    DOB: 03/22/1971, 53 y.o.   MRN: [id number]  Chief Complaint  Patient presents with   Follow-up    4 month f/u, had lab work done prior    HPI  Discussed the use of AI scribe software for clinical note transcription with the patient, who gave verbal consent to proceed.  History of Present Illness Janice Huang is a 53 year old female with prediabetes and hyperlipidemia who presents for follow-up on chronic health conditions.  Weight management and obesity - Weight gain since discontinuing Wegovy  around the time of recent surgery (stopped two weeks pre-op and during six weeks of post-op inactivity) - Has a few Wegovy  doses remaining at home - History of weight loss surgery in 2016 - Considering right total knee replacement but advised to reduce BMI below 40 prior to surgery  Musculoskeletal symptoms and mobility - Mobility limited by knee and foot issues, making exercise difficult - Plans to start physical therapy for knees and ankle in January  Appetite and dietary habits - Low appetite, often needs to make herself eat - Minimal morning intake - Prefers meat and potatoes, fianc is a investment banker, operational - Drinks green tea and avoids soda - Works from home, which affects meal patterns  Hyperlipidemia and cardiovascular risk - Takes atorvastatin  daily for hyperlipidemia  Hypertension and medication management - Takes indapamide  regularly for blood pressure control - Will need a refill of blood pressure medication soon  Other chronic conditions and medications - Takes allopurinol  regularly - Prescribed vitamin D  and plans to pick it up     Health Maintenance Due  Topic Date Due   Hepatitis B Vaccines 19-59 Average Risk (1 of 3 - 19+ 3-dose series) Never done   Pneumococcal Vaccine: 50+ Years (2 of 2 - PCV) 01/17/2014   OPHTHALMOLOGY EXAM  05/10/2017   FOOT EXAM  07/24/2019   Zoster Vaccines- Shingrix   (2 of 2) 02/22/2024   Diabetic kidney evaluation - Urine ACR  05/02/2024    Past Medical History:  Diagnosis Date   Gout attack    last flare up last week, in toes   Headache    History of hiatal hernia    fixed with weight loss surgery   Hyperlipidemia    Hypertension    Postoperative pain 04/28/2024   Sleep apnea    prior to weight loss surgery    Past Surgical History:  Procedure Laterality Date    c section x 2     ABDOMINAL HYSTERECTOMY     1 ovary removed   BREAST BIOPSY Right 02/23/2020   fibroadenoma   LAPAROSCOPIC GASTRIC SLEEVE RESECTION N/A 09/21/2014   Procedure: LAPAROSCOPIC GASTRIC SLEEVE RESECTION WITH HIATAL HERNIA REPAIR;  Surgeon: Donnice Lunger, MD;  Location: [facility];  Service: General;  Laterality: N/A;   TUBAL LIGATION      Family History  Problem Relation Age of Onset   Diabetes Maternal Grandmother    Hypertension Maternal Grandfather    Diabetes Paternal Grandmother    Hypertension Other    Diabetes Other        type 2   Cancer Neg Hx    Heart disease Neg Hx    Hyperlipidemia Neg Hx    Stroke Neg Hx    Breast cancer Neg Hx    Colon cancer Neg Hx    Colon polyps Neg Hx    Esophageal cancer Neg Hx    Rectal  cancer Neg Hx    Stomach cancer Neg Hx     Social History   Socioeconomic History   Marital status: Divorced    Spouse name: Not on file   Number of children: Not on file   Years of education: Not on file   Highest education level: Associate degree: occupational, scientist, product/process development, or vocational program  Occupational History   Not on file  Tobacco Use   Smoking status: Never    Passive exposure: Current   Smokeless tobacco: Never  Vaping Use   Vaping status: Never Used  Substance and Sexual Activity   Alcohol use: Not Currently    Comment: socially   Drug use: No   Sexual activity: Yes    Partners: Male    Birth control/protection: Surgical    Comment: hysterectomy, menarche 53yo, sexual debut 53yo  Other Topics Concern   Not  on file  Social History Narrative   Regular exercise-No   Children: 3- 2 boys and a girl   Caffienated drinks-no   Seat belt use often-yes   Regular Exercise-no   Smoke alarm in the home-yes   Firearms/guns in the home-no   History of physical abuse-no               Social Drivers of Health   Tobacco Use: Medium Risk (01/02/2024)   Patient History    Smoking Tobacco Use: Never    Smokeless Tobacco Use: Never    Passive Exposure: Current  Financial Resource Strain: Medium Risk (12/19/2023)   Overall Financial Resource Strain (CARDIA)    Difficulty of Paying Living Expenses: Somewhat hard  Food Insecurity: Food Insecurity Present (12/19/2023)   Epic    Worried About Programme Researcher, Broadcasting/film/video in the Last Year: Sometimes true    Ran Out of Food in the Last Year: Sometimes true  Transportation Needs: No Transportation Needs (12/19/2023)   Epic    Lack of Transportation (Medical): No    Lack of Transportation (Non-Medical): No  Physical Activity: Inactive (12/19/2023)   Exercise Vital Sign    Days of Exercise per Week: 0 days    Minutes of Exercise per Session: Not on file  Stress: No Stress Concern Present (12/19/2023)   [employer] of Occupational Health - Occupational Stress Questionnaire    Feeling of Stress: Only a little  Social Connections: Socially Integrated (12/19/2023)   Social Connection and Isolation Panel    Frequency of Communication with Friends and Family: More than three times a week    Frequency of Social Gatherings with Friends and Family: More than three times a week    Attends Religious Services: More than 4 times per year    Active Member of Clubs or Organizations: Yes    Attends Banker Meetings: More than 4 times per year    Marital Status: Living with partner  Intimate Partner Violence: Not on file  Depression (PHQ2-9): Low Risk (05/01/2024)   Depression (PHQ2-9)    PHQ-2 Score: 0  Alcohol Screen: Low Risk (09/05/2022)   Alcohol Screen     Last Alcohol Screening Score (AUDIT): 1  Housing: High Risk (12/19/2023)   Epic    Unable to Pay for Housing in the Last Year: Yes    Number of Times Moved in the Last Year: Not on file    Homeless in the Last Year: No  Utilities: Not on file  Health Literacy: Not on file    Outpatient Medications Prior to Visit  Medication Sig Dispense Refill  allopurinol  (ZYLOPRIM ) 300 MG tablet TAKE 1 TABLET(300 MG) BY MOUTH DAILY 90 tablet 1   clotrimazole -betamethasone  (LOTRISONE ) cream Apply 1 Application topically daily. 30 g 0   colchicine  0.6 MG tablet Take 1 tablet (0.6 mg total) by mouth 2 (two) times daily. 180 tablet 0   Estradiol  (YUVAFEM ) 10 MCG TABS vaginal tablet Place 1 tablet (10 mcg total) vaginally 2 (two) times a week. 8 tablet 11   ibuprofen (ADVIL) 800 MG tablet TAKE 1 TABLET BY MOUTH THREE TIMES DAILY FOR 10 DAYS AS NEEDED. TAKE ONLY WITH FOOD/MEAL.     indapamide  (LOZOL ) 1.25 MG tablet TAKE 1 TABLET(1.25 MG) BY MOUTH DAILY 90 tablet 1   atorvastatin  (LIPITOR) 20 MG tablet TAKE 1 TABLET(20 MG) BY MOUTH DAILY 90 tablet 1   ATHLETES FOOT, CLOTRIMAZOLE , 1 % cream Apply topically as needed. 30 g 0   methocarbamol  (ROBAXIN ) 500 MG tablet Take 2 tablets (1,000 mg total) by mouth every 8 (eight) hours as needed for muscle spasms. 180 tablet 1   Semaglutide -Weight Management (WEGOVY ) 0.5 MG/0.5ML SOAJ Inject 0.5 mg into the skin once a week. (Patient not taking: Reported on 05/01/2024) 2 mL 4   Vitamin D , Ergocalciferol , (DRISDOL ) 1.25 MG (50000 UNIT) CAPS capsule Take 1 capsule (50,000 Units total) by mouth every 7 (seven) days. (Patient not taking: Reported on 05/01/2024) 12 capsule 0   meloxicam  (MOBIC ) 15 MG tablet Take 1 tablet (15 mg total) by mouth daily. (Patient not taking: Reported on 05/01/2024) 30 tablet 0   Facility-Administered Medications Prior to Visit  Medication Dose Route Frequency Provider Last Rate Last Admin   0.9 %  sodium chloride  infusion  500 mL Intravenous  Continuous Danis, Victory CROME III, MD        Allergies[1]  Review of Systems  Constitutional:  Negative for chills, fever and weight loss.  Respiratory:  Negative for shortness of breath.   Cardiovascular:  Negative for chest pain, palpitations and leg swelling.  Gastrointestinal:  Negative for abdominal pain, constipation, diarrhea, nausea and vomiting.  Musculoskeletal:  Positive for joint pain.  Neurological:  Negative for dizziness, focal weakness and headaches.       Objective:    Physical Exam Constitutional:      General: She is not in acute distress.    Appearance: She is obese. She is not ill-appearing.  Eyes:     Extraocular Movements: Extraocular movements intact.     Conjunctiva/sclera: Conjunctivae normal.  Cardiovascular:     Rate and Rhythm: Normal rate.  Pulmonary:     Effort: Pulmonary effort is normal.  Musculoskeletal:     Cervical back: Normal range of motion and neck supple.  Skin:    General: Skin is warm and dry.  Neurological:     General: No focal deficit present.     Mental Status: She is alert and oriented to person, place, and time.     Gait: Gait abnormal.     Comments: Using cane   Psychiatric:        Mood and Affect: Mood normal.        Behavior: Behavior normal.        Thought Content: Thought content normal.      BP 132/86   Pulse 80   Temp 97.8 F (36.6 C) (Temporal)   Ht 5' 8.25 (1.734 m)   Wt (!) 302 lb (137 kg)   SpO2 99%   BMI 45.58 kg/m  Wt Readings from Last 3 Encounters:  05/01/24 (!) 302  lb (137 kg)  01/02/24 290 lb (131.5 kg)  12/25/23 287 lb (130.2 kg)        Assessment & Plan:   Problem List Items Addressed This Visit     Morbid obesity (HCC)   Vitamin D  deficiency disease   Other Visit Diagnoses       Hyperlipidemia with target LDL less than 130    -  Primary   Relevant Medications   atorvastatin  (LIPITOR) 40 MG tablet     Coronary artery calcification seen on CT scan       Relevant Medications    atorvastatin  (LIPITOR) 40 MG tablet       Assessment and Plan Assessment & Plan Morbid obesity Weight gain likely due to discontinuation of Wegovy  post-surgery. Limited mobility due to knee and foot healing. Appetite is low, but dietary habits need improvement. BMI needs to be less than 40 for knee replacement surgery. - Restart Wegovy  for weight loss - Focus on dietary improvements and increased physical activity as tolerated - Consider referral to [facility] program for additional support  Hyperlipidemia LDL cholesterol not at target level of less than 70. Currently taking atorvastatin  daily. Dietary habits may be contributing to suboptimal cholesterol levels. - Increased atorvastatin  dose - Continue daily atorvastatin  - Focus on dietary improvements to support cholesterol management  Vitamin D  deficiency Vitamin D  prescription provided by colleague. Importance of vitamin D  for calcium  absorption and overall health discussed. - Pick up and start vitamin D  prescription  Prediabetes Dietary habits need improvement to manage blood sugar levels. - Focus on dietary improvements to manage blood sugar levels     I have discontinued Lynnette B. Willems's meloxicam  and atorvastatin . I am also having her start on atorvastatin . Additionally, I am having her maintain her clotrimazole -betamethasone , colchicine , Athletes Foot (Clotrimazole ), methocarbamol , Wegovy , allopurinol , Estradiol , indapamide , Vitamin D  (Ergocalciferol ), and ibuprofen. We will continue to administer sodium chloride .  Meds ordered this encounter  Medications   atorvastatin  (LIPITOR) 40 MG tablet    Sig: Take 1 tablet (40 mg total) by mouth daily.    Dispense:  90 tablet    Refill:  3    Supervising Provider:   ROLLENE NORRIS A [[id number]]       [1]  Allergies Allergen Reactions   Lisinopril  Cough   Codeine     REACTION: itching and hives   Latex Dermatitis    Irritation with latex  condoms   Penicillins Itching

## 2024-05-05 ENCOUNTER — Encounter: Payer: Self-pay | Admitting: Family Medicine

## 2024-05-05 ENCOUNTER — Telehealth: Payer: Self-pay

## 2024-05-05 ENCOUNTER — Other Ambulatory Visit (HOSPITAL_COMMUNITY): Payer: Self-pay

## 2024-05-05 NOTE — Telephone Encounter (Signed)
 Copied from CRM #[id number]. Topic: Clinical - Medication Prior Auth >> May 05, 2024  8:54 AM Zy'onna H wrote: Reason for CRM: Patient called in stating her insurance company recently reached out to her provider for a Medication Prior Auth.  Medication: Semaglutide -Weight Management (WEGOVY ) 0.5 MG/0.5ML SOAJ  **Patient stated to please ensure the form is completed in its entirety to make sure there is no delay in patient care needed to proceed.  Examples of what insurance company may need from PCP:  1. Last Visit/Summary 2. Pt. Current Weight & Height.  ** Please reach out [employer] to verify**

## 2024-05-05 NOTE — Telephone Encounter (Signed)
 Pharmacy Patient Advocate Encounter   Received notification from Pt Calls Messages that prior authorization for Wegovy  0.5mg /0.29ml is required/requested.   Insurance verification completed.   The patient is insured through [facility].   Per test claim: PA required; PA submitted to above mentioned insurance via Latent Key/confirmation #/EOC [id number] Status is pending

## 2024-05-06 ENCOUNTER — Telehealth: Payer: Self-pay

## 2024-05-06 NOTE — Telephone Encounter (Signed)
 Please see mychart encounter

## 2024-05-06 NOTE — Telephone Encounter (Signed)
 Copied from CRM #[id number]. Topic: Clinical - Prescription Issue >> May 06, 2024  8:13 AM Janice Huang wrote: Reason for CRM: Patient would like a call about her Wegovy  about the pre authorization because it was denied because the way that it was sent to insurance, please give patient a call [phone]

## 2024-05-08 NOTE — Telephone Encounter (Signed)
 Is there a denial listed? If so are we able to appeal that there was no weight loss due to pt stopping the medication due to surgery (in last ov notes)

## 2024-05-09 ENCOUNTER — Telehealth: Payer: Self-pay | Admitting: Pharmacist

## 2024-05-09 ENCOUNTER — Other Ambulatory Visit: Payer: Self-pay | Admitting: Family Medicine

## 2024-05-09 ENCOUNTER — Other Ambulatory Visit (HOSPITAL_COMMUNITY): Payer: Self-pay

## 2024-05-09 DIAGNOSIS — I10 Essential (primary) hypertension: Secondary | ICD-10-CM

## 2024-05-09 NOTE — Telephone Encounter (Signed)
 E-Appeal has been submitted. Will advise when response is received, please be advised that most companies may take 30 days to make a decision. Appeal letter and supporting documentation have been uploaded and submitted via CMM website.  Thank you, Dene Fines, PharmD Clinical Pharmacist  [place]  Direct Dial: [phone]

## 2024-05-09 NOTE — Telephone Encounter (Signed)
 Pharmacy Patient Advocate Encounter  Received notification from [employer] that Prior Authorization for Wegovy  0.5mg /0.37ml has been DENIED.  See denial reason below. No denial letter attached in CMM. Will attach denial letter to Media tab once received.   PA #/Case ID/Reference #: [id number]

## 2024-05-14 ENCOUNTER — Other Ambulatory Visit (HOSPITAL_COMMUNITY): Payer: Self-pay

## 2024-05-14 NOTE — Telephone Encounter (Signed)
 Insurance has approved the appeal for Wegovy , full letter can be found under the media tab:

## 2024-05-16 ENCOUNTER — Other Ambulatory Visit: Payer: Self-pay | Admitting: Family Medicine

## 2024-05-16 DIAGNOSIS — M1000 Idiopathic gout, unspecified site: Secondary | ICD-10-CM

## 2024-05-16 NOTE — Telephone Encounter (Signed)
 Notified pt via [employer]

## 2024-07-02 ENCOUNTER — Ambulatory Visit: Payer: Self-pay | Admitting: Family Medicine

## 2024-12-25 ENCOUNTER — Ambulatory Visit: Admitting: Radiology
